# Patient Record
Sex: Female | Born: 1985 | Race: Black or African American | Hispanic: No | Marital: Single | Smoking: Former smoker
Health system: Southern US, Community
[De-identification: ages and names within clinical notes are randomized; demographics above are authoritative.]

## PROBLEM LIST (undated history)

## (undated) DIAGNOSIS — G4730 Sleep apnea, unspecified: Secondary | ICD-10-CM

## (undated) DIAGNOSIS — K219 Gastro-esophageal reflux disease without esophagitis: Secondary | ICD-10-CM

## (undated) DIAGNOSIS — E119 Type 2 diabetes mellitus without complications: Secondary | ICD-10-CM

## (undated) DIAGNOSIS — F419 Anxiety disorder, unspecified: Secondary | ICD-10-CM

## (undated) DIAGNOSIS — O24419 Gestational diabetes mellitus in pregnancy, unspecified control: Secondary | ICD-10-CM

## (undated) DIAGNOSIS — E669 Obesity, unspecified: Secondary | ICD-10-CM

## (undated) DIAGNOSIS — IMO0001 Reserved for inherently not codable concepts without codable children: Secondary | ICD-10-CM

## (undated) DIAGNOSIS — F32A Depression, unspecified: Secondary | ICD-10-CM

## (undated) DIAGNOSIS — R030 Elevated blood-pressure reading, without diagnosis of hypertension: Secondary | ICD-10-CM

## (undated) DIAGNOSIS — I10 Essential (primary) hypertension: Secondary | ICD-10-CM

## (undated) DIAGNOSIS — F329 Major depressive disorder, single episode, unspecified: Secondary | ICD-10-CM

## (undated) HISTORY — DX: Sleep apnea, unspecified: G47.30

## (undated) HISTORY — DX: Essential (primary) hypertension: I10

## (undated) HISTORY — DX: Reserved for inherently not codable concepts without codable children: IMO0001

## (undated) HISTORY — DX: Depression, unspecified: F32.A

## (undated) HISTORY — DX: Elevated blood-pressure reading, without diagnosis of hypertension: R03.0

## (undated) HISTORY — PX: OTHER SURGICAL HISTORY: SHX169

## (undated) HISTORY — DX: Major depressive disorder, single episode, unspecified: F32.9

## (undated) HISTORY — DX: Gastro-esophageal reflux disease without esophagitis: K21.9

## (undated) HISTORY — DX: Anxiety disorder, unspecified: F41.9

---

## 2004-04-19 ENCOUNTER — Emergency Department: Payer: Self-pay | Admitting: Emergency Medicine

## 2004-05-08 ENCOUNTER — Emergency Department: Payer: Self-pay | Admitting: Emergency Medicine

## 2004-07-20 ENCOUNTER — Emergency Department: Payer: Self-pay | Admitting: Emergency Medicine

## 2005-01-16 ENCOUNTER — Other Ambulatory Visit: Payer: Self-pay

## 2005-01-16 ENCOUNTER — Emergency Department: Payer: Self-pay | Admitting: Emergency Medicine

## 2005-04-20 ENCOUNTER — Other Ambulatory Visit: Admission: RE | Admit: 2005-04-20 | Discharge: 2005-04-20 | Payer: Self-pay | Admitting: Obstetrics and Gynecology

## 2005-05-23 HISTORY — PX: OTHER SURGICAL HISTORY: SHX169

## 2005-12-14 ENCOUNTER — Emergency Department: Payer: Self-pay | Admitting: Emergency Medicine

## 2006-08-14 ENCOUNTER — Other Ambulatory Visit: Payer: Self-pay

## 2006-08-14 ENCOUNTER — Emergency Department: Payer: Self-pay | Admitting: Unknown Physician Specialty

## 2008-03-26 ENCOUNTER — Emergency Department: Payer: Self-pay | Admitting: Emergency Medicine

## 2008-05-23 DIAGNOSIS — I509 Heart failure, unspecified: Secondary | ICD-10-CM

## 2008-05-23 HISTORY — DX: Heart failure, unspecified: I50.9

## 2008-06-14 ENCOUNTER — Emergency Department: Payer: Self-pay | Admitting: Emergency Medicine

## 2008-06-16 ENCOUNTER — Inpatient Hospital Stay (HOSPITAL_COMMUNITY): Admission: AD | Admit: 2008-06-16 | Discharge: 2008-06-16 | Payer: Self-pay | Admitting: Obstetrics and Gynecology

## 2008-07-22 DIAGNOSIS — G43909 Migraine, unspecified, not intractable, without status migrainosus: Secondary | ICD-10-CM | POA: Insufficient documentation

## 2008-10-09 ENCOUNTER — Inpatient Hospital Stay (HOSPITAL_COMMUNITY): Admission: AD | Admit: 2008-10-09 | Discharge: 2008-10-10 | Payer: Self-pay | Admitting: Obstetrics and Gynecology

## 2008-10-14 ENCOUNTER — Inpatient Hospital Stay (HOSPITAL_COMMUNITY): Admission: AD | Admit: 2008-10-14 | Discharge: 2008-10-15 | Payer: Self-pay | Admitting: Obstetrics and Gynecology

## 2008-10-15 ENCOUNTER — Encounter: Payer: Self-pay | Admitting: Obstetrics and Gynecology

## 2008-10-26 ENCOUNTER — Ambulatory Visit: Payer: Self-pay | Admitting: Cardiovascular Disease

## 2008-10-29 ENCOUNTER — Inpatient Hospital Stay (HOSPITAL_COMMUNITY): Admission: AD | Admit: 2008-10-29 | Discharge: 2008-11-09 | Payer: Self-pay | Admitting: Obstetrics and Gynecology

## 2008-10-30 ENCOUNTER — Encounter (HOSPITAL_COMMUNITY): Payer: Self-pay | Admitting: Obstetrics and Gynecology

## 2008-10-31 ENCOUNTER — Encounter (HOSPITAL_COMMUNITY): Payer: Self-pay | Admitting: Obstetrics and Gynecology

## 2008-11-03 ENCOUNTER — Other Ambulatory Visit (HOSPITAL_COMMUNITY): Payer: Self-pay | Admitting: Obstetrics and Gynecology

## 2008-11-04 ENCOUNTER — Encounter (INDEPENDENT_AMBULATORY_CARE_PROVIDER_SITE_OTHER): Payer: Self-pay | Admitting: Obstetrics and Gynecology

## 2008-11-04 ENCOUNTER — Encounter: Payer: Self-pay | Admitting: Obstetrics and Gynecology

## 2008-11-10 ENCOUNTER — Encounter: Admission: RE | Admit: 2008-11-10 | Discharge: 2008-12-09 | Payer: Self-pay | Admitting: Obstetrics and Gynecology

## 2008-12-10 ENCOUNTER — Encounter: Admission: RE | Admit: 2008-12-10 | Discharge: 2008-12-11 | Payer: Self-pay | Admitting: Obstetrics and Gynecology

## 2008-12-22 ENCOUNTER — Ambulatory Visit: Payer: Self-pay | Admitting: Family Medicine

## 2009-02-12 ENCOUNTER — Encounter: Payer: Self-pay | Admitting: Family Medicine

## 2009-02-20 ENCOUNTER — Encounter: Payer: Self-pay | Admitting: Family Medicine

## 2009-03-23 ENCOUNTER — Encounter: Payer: Self-pay | Admitting: Family Medicine

## 2009-09-22 DIAGNOSIS — N912 Amenorrhea, unspecified: Secondary | ICD-10-CM | POA: Insufficient documentation

## 2010-01-21 ENCOUNTER — Ambulatory Visit: Payer: Self-pay | Admitting: Family Medicine

## 2010-06-14 ENCOUNTER — Encounter: Payer: Self-pay | Admitting: Obstetrics and Gynecology

## 2010-08-30 LAB — COMPREHENSIVE METABOLIC PANEL
ALT: 23 U/L (ref 0–35)
ALT: 13 U/L (ref 0–35)
ALT: 14 U/L (ref 0–35)
ALT: 14 U/L (ref 0–35)
ALT: 26 U/L (ref 0–35)
AST: 36 U/L (ref 0–37)
AST: 45 U/L — ABNORMAL HIGH (ref 0–37)
AST: 19 U/L (ref 0–37)
AST: 20 U/L (ref 0–37)
AST: 30 U/L (ref 0–37)
AST: 41 U/L — ABNORMAL HIGH (ref 0–37)
AST: 43 U/L — ABNORMAL HIGH (ref 0–37)
Albumin: 2 g/dL — ABNORMAL LOW (ref 3.5–5.2)
Albumin: 2.2 g/dL — ABNORMAL LOW (ref 3.5–5.2)
Albumin: 2.2 g/dL — ABNORMAL LOW (ref 3.5–5.2)
Albumin: 2.4 g/dL — ABNORMAL LOW (ref 3.5–5.2)
Albumin: 2.3 g/dL — ABNORMAL LOW (ref 3.5–5.2)
Albumin: 2.4 g/dL — ABNORMAL LOW (ref 3.5–5.2)
Albumin: 2.4 g/dL — ABNORMAL LOW (ref 3.5–5.2)
Albumin: 2.4 g/dL — ABNORMAL LOW (ref 3.5–5.2)
Albumin: 2.6 g/dL — ABNORMAL LOW (ref 3.5–5.2)
Alkaline Phosphatase: 67 U/L (ref 39–117)
Alkaline Phosphatase: 83 U/L (ref 39–117)
Alkaline Phosphatase: 86 U/L (ref 39–117)
Alkaline Phosphatase: 94 U/L (ref 39–117)
Alkaline Phosphatase: 95 U/L (ref 39–117)
BUN: 11 mg/dL (ref 6–23)
BUN: 16 mg/dL (ref 6–23)
BUN: 5 mg/dL — ABNORMAL LOW (ref 6–23)
BUN: 5 mg/dL — ABNORMAL LOW (ref 6–23)
BUN: 8 mg/dL (ref 6–23)
BUN: 9 mg/dL (ref 6–23)
CO2: 24 mEq/L (ref 19–32)
CO2: 23 mEq/L (ref 19–32)
CO2: 24 mEq/L (ref 19–32)
CO2: 25 mEq/L (ref 19–32)
CO2: 25 mEq/L (ref 19–32)
Calcium: 8.3 mg/dL — ABNORMAL LOW (ref 8.4–10.5)
Calcium: 8.7 mg/dL (ref 8.4–10.5)
Calcium: 9 mg/dL (ref 8.4–10.5)
Chloride: 102 mEq/L (ref 96–112)
Chloride: 104 mEq/L (ref 96–112)
Chloride: 105 mEq/L (ref 96–112)
Chloride: 106 mEq/L (ref 96–112)
Chloride: 107 mEq/L (ref 96–112)
Chloride: 108 mEq/L (ref 96–112)
Creatinine, Ser: 0.84 mg/dL (ref 0.4–1.2)
Creatinine, Ser: 0.92 mg/dL (ref 0.4–1.2)
Creatinine, Ser: 0.66 mg/dL (ref 0.4–1.2)
Creatinine, Ser: 0.72 mg/dL (ref 0.4–1.2)
Creatinine, Ser: 0.75 mg/dL (ref 0.4–1.2)
Creatinine, Ser: 0.77 mg/dL (ref 0.4–1.2)
Creatinine, Ser: 0.81 mg/dL (ref 0.4–1.2)
GFR calc Af Amer: >60 mL/min (ref >60)
GFR calc Af Amer: >60 mL/min (ref >60)
GFR calc Af Amer: >60 mL/min (ref >60)
GFR calc Af Amer: >60 mL/min (ref >60)
GFR calc Af Amer: >60 mL/min (ref >60)
GFR calc Af Amer: >60 mL/min (ref >60)
GFR calc Af Amer: >60 mL/min (ref >60)
GFR calc non Af Amer: >60 mL/min (ref >60)
GFR calc non Af Amer: >60 mL/min (ref >60)
GFR calc non Af Amer: >60 mL/min (ref >60)
GFR calc non Af Amer: >60 mL/min (ref >60)
GFR calc non Af Amer: >60 mL/min (ref >60)
Glucose, Bld: 80 mg/dL (ref 70–99)
Glucose, Bld: 119 mg/dL — ABNORMAL HIGH (ref 70–99)
Glucose, Bld: 154 mg/dL — ABNORMAL HIGH (ref 70–99)
Glucose, Bld: 80 mg/dL (ref 70–99)
Potassium: 4.3 mEq/L (ref 3.5–5.1)
Potassium: 4.9 mEq/L (ref 3.5–5.1)
Potassium: 5.1 mEq/L (ref 3.5–5.1)
Potassium: 3.8 mEq/L (ref 3.5–5.1)
Potassium: 4.2 mEq/L (ref 3.5–5.1)
Potassium: 4.2 mEq/L (ref 3.5–5.1)
Potassium: 4.2 mEq/L (ref 3.5–5.1)
Sodium: 137 mEq/L (ref 135–145)
Sodium: 132 mEq/L — ABNORMAL LOW (ref 135–145)
Sodium: 135 mEq/L (ref 135–145)
Sodium: 140 mEq/L (ref 135–145)
Total Bilirubin: 0.1 mg/dL — ABNORMAL LOW (ref 0.3–1.2)
Total Bilirubin: 0.5 mg/dL (ref 0.3–1.2)
Total Bilirubin: 0.2 mg/dL — ABNORMAL LOW (ref 0.3–1.2)
Total Bilirubin: 0.3 mg/dL (ref 0.3–1.2)
Total Bilirubin: 0.4 mg/dL (ref 0.3–1.2)
Total Bilirubin: 0.5 mg/dL (ref 0.3–1.2)
Total Protein: 5 g/dL — ABNORMAL LOW (ref 6.0–8.3)
Total Protein: 5.5 g/dL — ABNORMAL LOW (ref 6.0–8.3)
Total Protein: 5.1 g/dL — ABNORMAL LOW (ref 6.0–8.3)
Total Protein: 5.1 g/dL — ABNORMAL LOW (ref 6.0–8.3)
Total Protein: 6 g/dL (ref 6.0–8.3)

## 2010-08-30 LAB — PROTEIN, URINE, 24 HOUR
Collection Interval-UPROT: 24 hours
Protein, 24H Urine: 1710 mg/d — ABNORMAL HIGH (ref 50–100)
Urine Total Volume-UPROT: 1900 mL

## 2010-08-30 LAB — CBC
HCT: 28.6 % — ABNORMAL LOW (ref 36.0–46.0)
HCT: 29 % — ABNORMAL LOW (ref 36.0–46.0)
HCT: 33.8 % — ABNORMAL LOW (ref 36.0–46.0)
HCT: 28.4 % — ABNORMAL LOW (ref 36.0–46.0)
HCT: 29.7 % — ABNORMAL LOW (ref 36.0–46.0)
HCT: 29.9 % — ABNORMAL LOW (ref 36.0–46.0)
HCT: 31.4 % — ABNORMAL LOW (ref 36.0–46.0)
Hemoglobin: 10 g/dL — ABNORMAL LOW (ref 12.0–15.0)
Hemoglobin: 10.6 g/dL — ABNORMAL LOW (ref 12.0–15.0)
Hemoglobin: 11.1 g/dL — ABNORMAL LOW (ref 12.0–15.0)
Hemoglobin: 11.9 g/dL — ABNORMAL LOW (ref 12.0–15.0)
MCHC: 35.2 g/dL (ref 30.0–36.0)
MCHC: 35.3 g/dL (ref 30.0–36.0)
MCHC: 35.4 g/dL (ref 30.0–36.0)
MCHC: 35.8 g/dL (ref 30.0–36.0)
MCV: 93.1 fL (ref 78.0–100.0)
MCV: 92.2 fL (ref 78.0–100.0)
MCV: 92.3 fL (ref 78.0–100.0)
MCV: 92.4 fL (ref 78.0–100.0)
MCV: 93.1 fL (ref 78.0–100.0)
MCV: 93.3 fL (ref 78.0–100.0)
MCV: 93.8 fL (ref 78.0–100.0)
Platelets: 140 10*3/uL — ABNORMAL LOW (ref 150–400)
Platelets: 143 10*3/uL — ABNORMAL LOW (ref 150–400)
Platelets: 168 10*3/uL (ref 150–400)
Platelets: 174 10*3/uL (ref 150–400)
Platelets: 170 10*3/uL (ref 150–400)
Platelets: 175 10*3/uL (ref 150–400)
Platelets: 178 10*3/uL (ref 150–400)
Platelets: 182 10*3/uL (ref 150–400)
Platelets: 193 10*3/uL (ref 150–400)
Platelets: 193 10*3/uL (ref 150–400)
RBC: 3.03 MIL/uL — ABNORMAL LOW (ref 3.87–5.11)
RBC: 3.61 MIL/uL — ABNORMAL LOW (ref 3.87–5.11)
RBC: 3.05 MIL/uL — ABNORMAL LOW (ref 3.87–5.11)
RBC: 3.1 MIL/uL — ABNORMAL LOW (ref 3.87–5.11)
RBC: 3.28 MIL/uL — ABNORMAL LOW (ref 3.87–5.11)
RBC: 3.4 MIL/uL — ABNORMAL LOW (ref 3.87–5.11)
RDW: 15.4 % (ref 11.5–15.5)
RDW: 15.5 % (ref 11.5–15.5)
RDW: 14.9 % (ref 11.5–15.5)
RDW: 15.1 % (ref 11.5–15.5)
RDW: 15.3 % (ref 11.5–15.5)
RDW: 15.4 % (ref 11.5–15.5)
RDW: 15.6 % — ABNORMAL HIGH (ref 11.5–15.5)
WBC: 10.8 10*3/uL — ABNORMAL HIGH (ref 4.0–10.5)
WBC: 13.4 10*3/uL — ABNORMAL HIGH (ref 4.0–10.5)
WBC: 9.1 10*3/uL (ref 4.0–10.5)
WBC: 9.8 10*3/uL (ref 4.0–10.5)
WBC: 11.4 10*3/uL — ABNORMAL HIGH (ref 4.0–10.5)
WBC: 12.9 10*3/uL — ABNORMAL HIGH (ref 4.0–10.5)
WBC: 8.2 10*3/uL (ref 4.0–10.5)
WBC: 8.5 10*3/uL (ref 4.0–10.5)

## 2010-08-30 LAB — URINALYSIS, MICROSCOPIC ONLY
Nitrite: NEGATIVE
Protein, ur: 100 mg/dL — AB
Urobilinogen, UA: 0.2 mg/dL (ref 0.0–1.0)

## 2010-08-30 LAB — URINE MICROSCOPIC-ADD ON

## 2010-08-30 LAB — CREATININE CLEARANCE, URINE, 24 HOUR
Creatinine, Urine: 105 mg/dL
Creatinine: 0.66 mg/dL (ref 0.40–1.20)
Urine Total Volume-CRCL: 1900 mL

## 2010-08-30 LAB — GLUCOSE, CAPILLARY
Glucose-Capillary: 91 mg/dL (ref 70–99)
Glucose-Capillary: 102 mg/dL — ABNORMAL HIGH (ref 70–99)
Glucose-Capillary: 85 mg/dL (ref 70–99)
Glucose-Capillary: 87 mg/dL (ref 70–99)
Glucose-Capillary: 87 mg/dL (ref 70–99)
Glucose-Capillary: 97 mg/dL (ref 70–99)

## 2010-08-30 LAB — STREP B DNA PROBE: Strep Group B Ag: POSITIVE

## 2010-08-30 LAB — LACTATE DEHYDROGENASE
LDH: 336 U/L — ABNORMAL HIGH (ref 94–250)
LDH: 546 U/L — ABNORMAL HIGH (ref 94–250)
LDH: 258 U/L — ABNORMAL HIGH (ref 94–250)
LDH: 262 U/L — ABNORMAL HIGH (ref 94–250)
LDH: 267 U/L — ABNORMAL HIGH (ref 94–250)

## 2010-08-30 LAB — BASIC METABOLIC PANEL
BUN: 15 mg/dL (ref 6–23)
GFR calc Af Amer: >60 mL/min (ref >60)
GFR calc non Af Amer: >60 mL/min (ref >60)
Potassium: 4.6 mEq/L (ref 3.5–5.1)

## 2010-08-30 LAB — URINALYSIS, ROUTINE W REFLEX MICROSCOPIC
Glucose, UA: NEGATIVE mg/dL
Protein, ur: >300 mg/dL — AB
pH: 6.5 (ref 5.0–8.0)

## 2010-08-30 LAB — GLUCOSE, RANDOM: Glucose, Bld: 87 mg/dL (ref 70–99)

## 2010-08-30 LAB — CARDIAC PANEL(CRET KIN+CKTOT+MB+TROPI)
Relative Index: 1.7 (ref 0.0–2.5)
Troponin I: 0.01 ng/mL (ref 0.00–0.06)

## 2010-08-30 LAB — URIC ACID
Uric Acid, Serum: 6.7 mg/dL (ref 2.4–7.0)
Uric Acid, Serum: 7.8 mg/dL — ABNORMAL HIGH (ref 2.4–7.0)
Uric Acid, Serum: 8 mg/dL — ABNORMAL HIGH (ref 2.4–7.0)
Uric Acid, Serum: 5 mg/dL (ref 2.4–7.0)
Uric Acid, Serum: 5.9 mg/dL (ref 2.4–7.0)
Uric Acid, Serum: 6.3 mg/dL (ref 2.4–7.0)

## 2010-08-31 LAB — COMPREHENSIVE METABOLIC PANEL
ALT: 14 U/L (ref 0–35)
AST: 13 U/L (ref 0–37)
AST: 18 U/L (ref 0–37)
AST: 19 U/L (ref 0–37)
Albumin: 2.7 g/dL — ABNORMAL LOW (ref 3.5–5.2)
Albumin: 2.8 g/dL — ABNORMAL LOW (ref 3.5–5.2)
Albumin: 2.8 g/dL — ABNORMAL LOW (ref 3.5–5.2)
Alkaline Phosphatase: 78 U/L (ref 39–117)
BUN: 3 mg/dL — ABNORMAL LOW (ref 6–23)
BUN: 4 mg/dL — ABNORMAL LOW (ref 6–23)
BUN: 5 mg/dL — ABNORMAL LOW (ref 6–23)
CO2: 23 mEq/L (ref 19–32)
CO2: 27 mEq/L (ref 19–32)
Calcium: 9.4 mg/dL (ref 8.4–10.5)
Calcium: 9.5 mg/dL (ref 8.4–10.5)
Chloride: 105 mEq/L (ref 96–112)
Chloride: 105 mEq/L (ref 96–112)
Chloride: 107 mEq/L (ref 96–112)
Creatinine, Ser: 0.51 mg/dL (ref 0.4–1.2)
Creatinine, Ser: 0.54 mg/dL (ref 0.4–1.2)
Creatinine, Ser: 0.6 mg/dL (ref 0.4–1.2)
Creatinine, Ser: 0.66 mg/dL (ref 0.4–1.2)
GFR calc Af Amer: >60 mL/min (ref >60)
GFR calc Af Amer: >60 mL/min (ref >60)
GFR calc Af Amer: >60 mL/min (ref >60)
GFR calc non Af Amer: >60 mL/min (ref >60)
GFR calc non Af Amer: >60 mL/min (ref >60)
Glucose, Bld: 94 mg/dL (ref 70–99)
Potassium: 4.6 mEq/L (ref 3.5–5.1)
Sodium: 136 mEq/L (ref 135–145)
Total Bilirubin: 0.2 mg/dL — ABNORMAL LOW (ref 0.3–1.2)
Total Bilirubin: 0.2 mg/dL — ABNORMAL LOW (ref 0.3–1.2)
Total Protein: 6.6 g/dL (ref 6.0–8.3)
Total Protein: 6.6 g/dL (ref 6.0–8.3)

## 2010-08-31 LAB — CBC
HCT: 31.6 % — ABNORMAL LOW (ref 36.0–46.0)
HCT: 32.3 % — ABNORMAL LOW (ref 36.0–46.0)
HCT: 33.4 % — ABNORMAL LOW (ref 36.0–46.0)
Hemoglobin: 11.1 g/dL — ABNORMAL LOW (ref 12.0–15.0)
Hemoglobin: 11.4 g/dL — ABNORMAL LOW (ref 12.0–15.0)
MCHC: 35.2 g/dL (ref 30.0–36.0)
MCHC: 35.4 g/dL (ref 30.0–36.0)
MCHC: 35.7 g/dL (ref 30.0–36.0)
MCV: 91 fL (ref 78.0–100.0)
MCV: 91.2 fL (ref 78.0–100.0)
MCV: 91.6 fL (ref 78.0–100.0)
MCV: 91.8 fL (ref 78.0–100.0)
Platelets: 254 10*3/uL (ref 150–400)
Platelets: 262 10*3/uL (ref 150–400)
Platelets: 286 10*3/uL (ref 150–400)
RBC: 3.46 MIL/uL — ABNORMAL LOW (ref 3.87–5.11)
RBC: 3.52 MIL/uL — ABNORMAL LOW (ref 3.87–5.11)
RDW: 15.4 % (ref 11.5–15.5)
WBC: 8.2 10*3/uL (ref 4.0–10.5)
WBC: 8.3 10*3/uL (ref 4.0–10.5)
WBC: 9.1 10*3/uL (ref 4.0–10.5)

## 2010-08-31 LAB — CREATININE CLEARANCE, URINE, 24 HOUR
Creatinine, 24H Ur: 2048 mg/d — ABNORMAL HIGH (ref 700–1800)
Creatinine: 0.66 mg/dL (ref 0.40–1.20)
Urine Total Volume-CRCL: 1825 mL

## 2010-08-31 LAB — PROTEIN, URINE, 24 HOUR
Collection Interval-UPROT: 24 hours
Collection Interval-UPROT: 24 hours
Protein, Urine: 6 mg/dL
Protein, Urine: 6 mg/dL
Urine Total Volume-UPROT: 1825 mL

## 2010-08-31 LAB — GLUCOSE, CAPILLARY
Glucose-Capillary: 105 mg/dL — ABNORMAL HIGH (ref 70–99)
Glucose-Capillary: 86 mg/dL (ref 70–99)

## 2010-08-31 LAB — LACTATE DEHYDROGENASE: LDH: 194 U/L (ref 94–250)

## 2010-08-31 LAB — URIC ACID
Uric Acid, Serum: 3.6 mg/dL (ref 2.4–7.0)
Uric Acid, Serum: 3.7 mg/dL (ref 2.4–7.0)

## 2010-09-06 LAB — URINALYSIS, ROUTINE W REFLEX MICROSCOPIC
Glucose, UA: NEGATIVE mg/dL
Hgb urine dipstick: NEGATIVE
Specific Gravity, Urine: 1.02 (ref 1.005–1.030)
pH: 6.5 (ref 5.0–8.0)

## 2010-09-27 ENCOUNTER — Emergency Department: Payer: Self-pay | Admitting: Internal Medicine

## 2010-10-05 NOTE — Discharge Summary (Signed)
[name], [name]              ACCOUNT NO.:  [id number]   MEDICAL RECORD NO.:  [id number]          PATIENT TYPE:  INP   LOCATION:  9319                          [facility]ILITY:  [facility]   PHYSICIAN:  Zelphia Cairo, MD    DATE OF BIRTH:  1986-01-25   DATE OF ADMISSION:  10/29/2008  DATE OF DISCHARGE:  11/09/2008                               DISCHARGE SUMMARY   ADMITTING DIAGNOSES:  1. Intrauterine pregnancy at 69 weeks' estimated gestational age.  2. Shortness of breath.  3. Elevation in blood pressure.  4. Gestational diabetes.   DISCHARGE DIAGNOSES:  1. Status post low transverse cesarean section secondary to severe      preeclampsia and unfavorable cervix.  2. Viable female infant.   PROCEDURE:  Primary low transverse cesarean section.   REASON FOR ADMISSION:  Please see written H and P.   HOSPITAL COURSE:  The patient is a 25 year old, gravida 1, para 0, who  was admitted with a diagnosis of hypertension and shortness of breath.  Subsequent during her hospitalization, the patient did develop pulmonary  edema.  She was followed closely by Maternal Fetal Medicine and  Cardiology.  The patient did have significant proteinuria on her 24-hour  urine.  Blood pressures continued to worsen despite being on bedrest and  antihypertensive medication.  Liver function tests were also noted to be  elevated.  The patient was evaluated by Perinatology, and decision was  made to proceed with an induction of labor.  The patient was given  Cervidil; however, on the following morning the patient continued to  complain of being short of breath.  Cervix was reexamined and found to  be long, closed, presentation the fetus to be extremely high in the  pelvis.  Given the patient's status of morbid obesity and difficulty  following the baby on the monitor with some intervals of small variable  decelerations, decision was made to proceed with a cesarean delivery.  The patient was then taken to the  operating room where epidural was  dosed to an adequate surgical level.  A low transverse incision was made  with delivery of a viable female infant which was attended by the NICU  team.  The patient tolerated procedure well and taken to the recovery  room in stable condition.  The patient was later then transferred to the  [facility] where she was followed closely over the next several days.  She  began to diurese.  Magnesium sulfate was discontinued, and the patient  was later transferred to the Mother-Baby Unit.  Decision was made to  place the patient on Lovenox due to her morbid obesity and decrease in  ambulation.  She continued to feel better.  She was now without  shortness of breath.  Vital signs were stable with blood pressure 140-  150 over 70s-90s.  Urine output revealed good diuresis.  Baby continued  to do well in the NICU, and on postoperative day 4 the patient was  without complaint.  She denied nausea, vomiting, headache, blurred  vision, or shortness of breath.  Vital signs remained stable.  Blood  pressure  was 139-160 over 81-91.  O2 sats were 96-99 on room air.  Incision was clean, dry, and intact.  Staples were left intact, and the  patient was later discharged home.   CONDITION ON DISCHARGE:  Stable.   DIET:  Regular as tolerated.   ACTIVITY:  No heavy lifting, no driving x2 weeks, no vaginal entry.   FOLLOWUP:  The patient to follow up in the office in 2-3 days for staple  removal.  She is to call for temperature greater than 100 degrees,  persistent nausea, vomiting, heavy vaginal bleeding, and/or redness or  drainage from the incisional site.  The patient was also instructed to  call for shortness of breath, headache, or blurred vision.   DISCHARGE MEDICATIONS:  1. Labetalol 100 mg t.i.d.  2. Lovenox subcu daily.  3. Prenatal vitamins 1 p.o. daily.      Julio Sicks, N.P.      Zelphia Cairo, MD  Electronically Signed    CC/MEDQ  D:  11/20/2008  T:   11/20/2008  Job:  [id number]

## 2010-10-05 NOTE — Op Note (Signed)
NAMESTEVIE, [name]              ACCOUNT NO.:  [id number]   MEDICAL RECORD NO.:  [id number]         PATIENT TYPE:  WINP   LOCATION:                                [facility]   PHYSICIAN:  Michelle L. Grewal, M.D.DATE OF BIRTH:  [birth year]   DATE OF PROCEDURE:  11/05/2008  DATE OF DISCHARGE:                               OPERATIVE REPORT   PREOPERATIVE DIAGNOSES:  Intrauterine pregnancy at 28 weeks, severe  preeclampsia, pulmonary edema, failed induction, and gestational  diabetes.   POSTOPERATIVE DIAGNOSES:  Intrauterine pregnancy at 28 weeks, severe  preeclampsia, pulmonary edema, failed induction, gestational diabetes,  and morbid obesity.   PROCEDURE:  Primary low transverse cesarean section.   SURGEON:  Michelle L. Vincente Poli, MD   ASSISTANT:  Juluis Mire, MD   ANESTHESIA:  Epidural.   ESTIMATED BLOOD LOSS:  500 mL.   DRAINS:  Foley.   PATHOLOGY:  Placenta.   DESCRIPTION OF PROCEDURE:  This patient is a 25 year old gravida 1, para  0, who had been admitted on October 29, 2008, with a diagnosis of  hypertension and shortness of breath.  Subsequently, during her  hospitalization, she was noted to have developed pulmonary edema.  She  was followed by Maternal Fetal Medicine.  She was noted to have  significant proteinuria on 24-hour urine yesterday.  Her blood pressure  was worsened despite being on antihypertensive and be on bedrest, and  her liver function tests were starting to become elevated.  It was  recommended by Perinatology to start an induction.  The patient was  given Cervidil last night.  I came in and examined her at 7:30 this  morning when I assumed call.  She was very short of breath when she was  lying back flat on the bed.  Her cervix was long and closed and  extremely high.  There was no presenting part in the pelvis.  Exam of  the fetal heart rate tracing revealed that is flat for 28 weeks fetus,  but it was very difficult because of her morbid  obesity to keep the baby  on the monitor and there were some periods of small variable  decelerations because she has not changed her cervix and because of  inability to monitor the fetus continuously.  I felt that she needed to  be delivered via C-section.  She was counseled on the risk associated  procedure.  She agreed to proceed.  We took her to operating room #1,  Dr. Arelia Sneddon, was my assistant.  Her epidural was dosed.  She was prepped  and draped.  A Foley catheter was inserted, had been inserted on labor  and delivery.  Her pannus was pushed up because of her morbid obesity.  A low transverse incision was made under her pannus, carried down to the  fascia.  Fascia was cut in the midline and extended laterally.  The  rectus muscles were separated in the midline and the peritoneum was  entered bluntly.  The bladder blade was inserted and low uterine segment  was identified, the bladder flap was created sharply and then digitally.  The bladder blade was then readjusted.  A low transverse incision was  made in the uterus.  The baby was in cephalic presentation, was  delivered quite easily with a female infant, and then the NICU was  present at the time of delivering.  The baby was taken to the NICU  because of extreme prematurity.  The cord pH was obtained and cord blood  was obtained.  The placenta was manually removed, noted to be normal for  her preterm placenta and sent to Pathology.  The uterus was  exteriorized.  Antibiotics and Pitocin had been given.  The uterus was  closed in 2 layers using 0 chromic in a running locked stitch.  Uterus  was returned to the abdomen.  Irrigation was performed.  The peritoneum  and rectus muscles were reapproximated using 0 Vicryl.  The fascia was  closed using 0 Vicryl in running stitch x2 starting each corner meeting  in midline.  After irrigation of subcutaneous layer, the skin was closed  with staples.  All sponge, lap, and instruments counts  were correct x2.  The patient went to recovery room in stable condition.      Michelle L. Vincente Poli, M.D.  Electronically Signed     MLG/MEDQ  D:  11/05/2008  T:  11/05/2008  Job:  [id number]

## 2010-10-05 NOTE — Consult Note (Signed)
NAMEMarland Kitchen  JEZELLE, GULLICK NO.:  [id number]   MEDICAL RECORD NO.:  [id number]         PATIENT TYPE:  WINP   LOCATION:                                [facility]   PHYSICIAN:  Verne Carrow, MDDATE OF BIRTH:  May 26, 1985   DATE OF CONSULTATION:  10/31/2008  DATE OF DISCHARGE:                                 CONSULTATION   PRIMARY CARDIOLOGIST:  New, being seen by Verne Carrow, MD   PRIMARY CARE PHYSICIAN:  Unknown at this time.   HISTORY:  Ms. Profit is a 25 year old African American female with no  known history of coronary artery disease, congestive heart failure or  hypertension prior to this pregnancy.  She is 25 weeks' gestational.  She has noticed increased swelling, headaches, blurred vision during  this pregnancy with elevated blood pressure at 24 weeks with positive  trace protein.  On Oct 14, 2008, she was seen in followup, had a 7-pound  weight gain in 1 week with worsening edema and headaches, blood pressure  150/90, 1+ protein.  She was also diagnosed with gestational diabetes at  that time and mild preeclampsia was noted breech pregnancy.  She states  on Monday she was started on labetalol 100 mg p.o. b.i.d., continued to  feel poorly, yesterday significant dyspnea noted with hypoxia.  The  patient was admitted and treated with IV Lasix with some improvement in  symptoms.  Continue to remain hypertensive in sinus tach, tachycardia on  the monitor, labetalol was increased to 200 mg p.o. b.i.d.  A 2-D  echocardiogram was obtained.  Limited study, systolic function was felt  to be normal with an estimated ejection fraction of 55-60%.  Study was  technically insufficient to allow for evaluation of LV diastolic  dysfunction.  No pericardial effusion noted.  Valves were poorly  visualized.  In talking with Ms. Alvira, she reports a 60-pound weight  gain in her 25 weeks' of pregnancy.  She states compliance with the  labetalol.  Denies any  chest heaviness, tightness or pressure, nausea,  vomiting, cough, fever or chills, lightheadedness, or dizziness.  No  recent travel.  In the emergency room, blood pressure was 169/114 with a  heart rate of 129, respirations 20.  She was treated with oxygen, neb  treatments.  Obtained a chest x-ray and lab work.  2-D echocardiogram  results as stated above.   PAST MEDICAL HISTORY:  1. Morbid obesity.  2. Sedentary lifestyle.  3. Gestational hypertension.  4. Gestational diabetes.  5. Migraines.  6. GERD.   SOCIAL HISTORY:  She lives in [place], [place].  She lives  with her boyfriend who is the father of her child.  She works in  Programmer, applications job.  She denies any tobacco, EtOH, or illicit  substance use.  No diet restrictions.  No exercise.   FAMILY HISTORY:  Mother is alive and well and present in the room.  She  does have hypertension and had preeclampsia during her pregnancies.  Father is alive and well with no known coronary artery disease, history  of heart failure, hypertension.  She  does have some history of  hypertension and heart disease in grandparents.   REVIEW OF SYSTEMS:  As per history of present illness, otherwise  negative.   ALLERGIES:  PENICILLIN which cause hives.   MEDICATIONS:  1. Multivitamin.  2. Aspirin 81 mg.  3. Labetalol, currently on 200 mg p.o. b.i.d. here.  4. Pepcid.  5. Lasix 20 mg IV.  She has had 4 doses.   DIAGNOSTICS:  Chest x-ray showed no acute findings.  CT angiogram,  negative for pulmonary emboli in the central area, unable to exclude  emboli, mid peripheral pulmonary arterial trees, no effusions.  Lab work  showed an AST of 19, ALT 13.  H and H 10.8, 30.2, WBCs 8.2, platelets  182,000.  Sodium 138, potassium 4.2, BUN 6, creatinine 0.77, and glucose  92.  Cardiac enzymes negative x1 set.   PHYSICAL EXAMINATION:  VITAL SIGNS:  Blood pressure 163/104, temperature  97.7, heart rate 98-110, respirations 20, and O2  sats 96% on room air.  GENERAL:  She is in no acute distress, very pleasant female, morbidly  obese, resting quietly in the bed.  HEENT:  Unremarkable.  NECK:  Supple without obvious lymphadenopathy, bruits, or JVD difficult  to assess secondary to body habitus.  CARDIOVASCULAR:  S1 and S2.  Distant heart sounds.  LUNGS:  Clear to auscultation, but distant.  ABDOMEN:  Obese, positive bowel sounds, soft, nontender.  EXTREMITIES:  Lower extremities with +3 edema bilaterally.  She has SCD  in place bilaterally.  NEUROLOGIC:  Alert and oriented x3.  Movement of extremities x4.   IMPRESSION:  1. Dyspnea.  2. Hypertension.  3. Diabetes.  4. Suspected sleep apnea/obesity hypoventilation syndrome.  5. Sedentary lifestyle.  6. Breech pregnancy.   A 2-D echocardiogram showing a normal ejection fraction, questionable  whether or not the patient has a component of diastolic dysfunction.  The patient needs aggressive lifestyle modification, decreased  comorbidities, improved blood pressure.  Blood pressure still not  optimally controlled with low-dose labetalol.  The patient is  tachycardic still.  CT excludes central PE.  CT does indicate bilateral  lower lobe infiltrates, questionable etiology.  Would consider having a  pulmonary evaluated if concerning.  From a cardiac perspective, would  not proceed with TEE at this time as the patient has responded to IV  Lasix.  We will increase labetalol to 400 mg p.o. b.i.d.  We will  consider TEE if the patient becomes unstable to check pulmonary  pressures.  Continue Lasix.  Also if needed, can add HCTZ, Norvasc, or  Aldomet for further optimal blood pressure control.  The patient will  definitely need a sleep study would  hold off until delivery.  Reinforce education.  Do not recommend further  pregnancies without improved health status.  Consider Pulmonary  consultation if CT worrisome.  Dr. Verne Carrow, has been into  examine and assess  the patient and agrees with plan of care.      Dorian Pod, ACNP      Verne Carrow, MD  Electronically Signed    MB/MEDQ  D:  10/31/2008  T:  11/01/2008  Job:  [id number]

## 2010-10-05 NOTE — H&P (Signed)
[name], [name] NO.:  [id number]   MEDICAL RECORD NO.:  [id number]          PATIENT TYPE:  OUT   LOCATION:                                [facility]  [facility]   PHYSICIAN:  Duke Salvia. Marcelle Overlie, M.D.DATE OF BIRTH:  08/09/1985   DATE OF ADMISSION:  10/15/2008  DATE OF DISCHARGE:                              HISTORY & PHYSICAL   CHIEF COMPLAINT:  Swelling and headache, pregnancy.   HISTORY OF PRESENT ILLNESS:  A 25 year old, G1, P0 who was admitted to  [facility] Oct 08, 2008 complaining of headache.  BP at that time  162/112 with 1+ protein.  She did receive betamethasone x2 while in  hospital.  Ultrasound at that time showed breech presentation with  normal growth.  A 24-hour urine showed a 110 mg of protein per 24 hours  with normal creatinine clearance, uric acid and PIH labs were normal at  that time.  Followup today, she is up 6-7 pounds in less than 1 week,  complaining of worsening lower extremity edema and blurry vision and  just not feeling well like she is in slow motion.   In the office, BP 146/90, 1+ protein with the significant 2 to 3+  pitting lower extremity edema.  Additionally while an the hospital she  had her 1-hour GTT done that showed a 1-hour the level of 227 consistent  with gestational diabetes.   PAST MEDICAL HISTORY:  Please see her [facility] form for details.  Specifically, her family history is negative for hypertension.  Initial  BP at 14 weeks 100/70.   PHYSICAL EXAMINATION:  VITAL SIGNS:  Temperature 98.2, blood pressure  140/96.  HEENT:  Unremarkable.  NECK:  Supple without masses.  LUNGS:  Clear.  CARDIOVASCULAR:  Regular rate and rhythm without murmurs, rubs or  gallops.  BREASTS:  Not examined.  ABDOMEN:  Patient with 25 cm fundal height.  Fetal heart rate 140.  Cervix not examined.  EXTREMITIES:  Revealed 2 to 3+ pitting lower extremity edema.  Reflexes  were 1 to 2+, no clonus.   IMPRESSION:  1. Patient with  25-week breech pregnancy.  2. Preeclampsia, mild.  3. Gestational diabetes.   PLAN:  We will admit for further observation, MFM consult.      Richard M. Marcelle Overlie, M.D.  Electronically Signed    RMH/MEDQ  D:  10/14/2008  T:  10/14/2008  Job:  [id number]

## 2010-10-05 NOTE — H&P (Signed)
[name], [name]              ACCOUNT NO.:  [id number]   MEDICAL RECORD NO.:  [id number]          PATIENT TYPE:  OBV   LOCATION:  9161                          [facility]   PHYSICIAN:  Duke Salvia. Marcelle Overlie, M.D.DATE OF BIRTH:  [birth year]   DATE OF ADMISSION:  10/08/2008  DATE OF DISCHARGE:  10/07/2008                              HISTORY & PHYSICAL   CHIEF COMPLAINT:  Swelling, headache, blurred vision.   HISTORY OF PRESENT ILLNESS:  A 25 year old G1, P0 at approximately 24  weeks was seen earlier in the office today complaining of lower  extremity edema, persistent headache and blurred vision.  At her visit  last week, she had a BP 138/98 with trace protein.  Today in the office,  BP was 160/110 with 1+ protein and 2+ lower extremity edema noted.   Ultrasound was carried out in the office revealing normal fluid and  normal growth parameters.  She was sent to MAU for serial BP evaluation  and PIH labs.  In MAU, her labs were normal.  She was given Tylenol  without relief of her headache, still continued to complain of blurred  vision and some of her blood pressures were still significantly  elevated, so she was admitted for further evaluation and monitoring.   PAST MEDICAL HISTORY:   ALLERGIES:  PENICILLIN.   OPERATIONS:  None.   FAMILY HISTORY AND SOCIAL HISTORY:  Please see her [employer] form for  details.  Of significance in her history is a history of migraine, but  no family history of hypertension.   PHYSICAL EXAMINATION:  VITAL SIGNS:  Temperature 98.2, blood pressure  160/110.  HEENT:  Unremarkable.  NECK:  Supple without masses.  LUNGS:  Clear.  CARDIOVASCULAR:  Rate and rhythm without murmurs, rubs or gallops.  BREASTS:  Not examined.  PELVIC:  A 25 cm fundal height.  Fetal heart rate 140.  Cervix was  closed.  EXTREMITIES:  Revealed 2+ lower extremity edema.  Reflexes 1-2+ with no  clonus.  Also of note was her weight of 313 pounds.   IMPRESSION:  1.  24-week intrauterine pregnancy.  2. Obesity.  3. Pregnancy-induced hypertension.   PLAN:  We will admit for 24-hour urine, will give betamethasone and  continue to monitor with repeat PIH labs in the a.m.      Richard M. Marcelle Overlie, M.D.  Electronically Signed     RMH/MEDQ  D:  10/08/2008  T:  10/08/2008  Job:  [id number]

## 2011-02-11 ENCOUNTER — Ambulatory Visit: Payer: Self-pay | Admitting: Family Medicine

## 2012-05-11 ENCOUNTER — Ambulatory Visit: Payer: Self-pay | Admitting: Family Medicine

## 2012-05-24 ENCOUNTER — Ambulatory Visit: Payer: Self-pay | Admitting: Family Medicine

## 2012-08-13 DIAGNOSIS — M545 Low back pain: Secondary | ICD-10-CM

## 2012-08-13 DIAGNOSIS — G8929 Other chronic pain: Secondary | ICD-10-CM | POA: Insufficient documentation

## 2012-10-24 DIAGNOSIS — R7989 Other specified abnormal findings of blood chemistry: Secondary | ICD-10-CM | POA: Insufficient documentation

## 2012-12-18 ENCOUNTER — Encounter: Payer: Self-pay | Admitting: Physical Medicine and Rehabilitation

## 2012-12-21 ENCOUNTER — Encounter: Payer: Self-pay | Admitting: Physical Medicine and Rehabilitation

## 2013-07-02 LAB — HEPATIC FUNCTION PANEL
ALT: 18 U/L (ref 7–35)
AST: 22 U/L (ref 13–35)

## 2013-07-02 LAB — CBC AND DIFFERENTIAL
HCT: 38 % (ref 36–46)
Hemoglobin: 13.1 g/dL (ref 12.0–16.0)
Platelets: 274 10*3/uL (ref 150–399)
WBC: 4.7 10^3/mL

## 2013-07-02 LAB — LIPID PANEL
CHOLESTEROL: 153 mg/dL (ref 0–200)
HDL: 57 mg/dL (ref 35–70)
LDL Cholesterol: 88 mg/dL
TRIGLYCERIDES: 38 mg/dL — AB (ref 40–160)

## 2013-07-02 LAB — BASIC METABOLIC PANEL
BUN: 10 mg/dL (ref 4–21)
CREATININE: 0.8 mg/dL (ref 0.5–1.1)
POTASSIUM: 4.7 mmol/L (ref 3.4–5.3)
SODIUM: 140 mmol/L (ref 137–147)

## 2014-06-10 LAB — TSH: TSH: 1.88 u[IU]/mL (ref 0.41–5.90)

## 2014-06-10 LAB — HEMOGLOBIN A1C: Hgb A1c MFr Bld: 5.3 % (ref 4.0–6.0)

## 2014-09-10 LAB — HM PAP SMEAR: HM Pap smear: NORMAL

## 2014-10-21 ENCOUNTER — Other Ambulatory Visit: Payer: Self-pay | Admitting: Family Medicine

## 2014-10-21 DIAGNOSIS — N939 Abnormal uterine and vaginal bleeding, unspecified: Secondary | ICD-10-CM

## 2014-10-23 ENCOUNTER — Ambulatory Visit: Payer: Medicaid Other

## 2014-10-23 ENCOUNTER — Ambulatory Visit
Admission: RE | Admit: 2014-10-23 | Discharge: 2014-10-23 | Disposition: A | Discharge status: Home / Self Care | Payer: Medicaid Other | Source: Ambulatory Visit | Attending: Family Medicine | Admitting: Family Medicine

## 2014-10-23 DIAGNOSIS — Z975 Presence of (intrauterine) contraceptive device: Secondary | ICD-10-CM | POA: Insufficient documentation

## 2014-10-23 DIAGNOSIS — N939 Abnormal uterine and vaginal bleeding, unspecified: Secondary | ICD-10-CM | POA: Diagnosis not present

## 2014-10-24 ENCOUNTER — Other Ambulatory Visit: Payer: Self-pay | Admitting: Family Medicine

## 2014-10-24 ENCOUNTER — Telehealth: Payer: Self-pay

## 2014-10-24 DIAGNOSIS — N939 Abnormal uterine and vaginal bleeding, unspecified: Secondary | ICD-10-CM

## 2014-10-24 NOTE — Telephone Encounter (Signed)
-----   Message from Anola Gurneyobert Chauvin, [place] sent at 10/23/2014  5:22 PM EDT ----- Ultrasound sees IUD is out of place in your uterus.Everything else is normal. Which gyn do you wish us to send your to?

## 2014-10-24 NOTE — Telephone Encounter (Signed)
Patient advised of lab results

## 2014-10-24 NOTE — Telephone Encounter (Signed)
Unable to reach patient at this time, home and cell number are same. Left message to call back. KW

## 2014-10-24 NOTE — Telephone Encounter (Signed)
Patient would like to be referred to [facility] she request to see same female physician that she saw last time or whoever she can get in with sooner. KW

## 2015-02-05 ENCOUNTER — Ambulatory Visit (INDEPENDENT_AMBULATORY_CARE_PROVIDER_SITE_OTHER): Payer: Medicaid Other | Admitting: Physician Assistant

## 2015-02-05 ENCOUNTER — Encounter: Payer: Self-pay | Admitting: Physician Assistant

## 2015-02-05 ENCOUNTER — Other Ambulatory Visit: Payer: Self-pay

## 2015-02-05 VITALS — BP 120/80 | HR 60 | Temp 98.4°F | Resp 16 | Wt 287.2 lb

## 2015-02-05 DIAGNOSIS — B3731 Acute candidiasis of vulva and vagina: Secondary | ICD-10-CM | POA: Insufficient documentation

## 2015-02-05 DIAGNOSIS — K589 Irritable bowel syndrome without diarrhea: Secondary | ICD-10-CM | POA: Insufficient documentation

## 2015-02-05 DIAGNOSIS — R1031 Right lower quadrant pain: Secondary | ICD-10-CM | POA: Insufficient documentation

## 2015-02-05 DIAGNOSIS — M545 Low back pain, unspecified: Secondary | ICD-10-CM | POA: Insufficient documentation

## 2015-02-05 DIAGNOSIS — Z7251 High risk heterosexual behavior: Secondary | ICD-10-CM | POA: Insufficient documentation

## 2015-02-05 DIAGNOSIS — E559 Vitamin D deficiency, unspecified: Secondary | ICD-10-CM | POA: Insufficient documentation

## 2015-02-05 DIAGNOSIS — N390 Urinary tract infection, site not specified: Secondary | ICD-10-CM | POA: Insufficient documentation

## 2015-02-05 DIAGNOSIS — F32A Depression, unspecified: Secondary | ICD-10-CM | POA: Insufficient documentation

## 2015-02-05 DIAGNOSIS — M797 Fibromyalgia: Secondary | ICD-10-CM | POA: Insufficient documentation

## 2015-02-05 DIAGNOSIS — R109 Unspecified abdominal pain: Secondary | ICD-10-CM | POA: Insufficient documentation

## 2015-02-05 DIAGNOSIS — Z6841 Body Mass Index (BMI) 40.0 and over, adult: Secondary | ICD-10-CM | POA: Insufficient documentation

## 2015-02-05 DIAGNOSIS — F329 Major depressive disorder, single episode, unspecified: Secondary | ICD-10-CM

## 2015-02-05 DIAGNOSIS — K219 Gastro-esophageal reflux disease without esophagitis: Secondary | ICD-10-CM

## 2015-02-05 DIAGNOSIS — F419 Anxiety disorder, unspecified: Secondary | ICD-10-CM | POA: Insufficient documentation

## 2015-02-05 DIAGNOSIS — G4733 Obstructive sleep apnea (adult) (pediatric): Secondary | ICD-10-CM | POA: Insufficient documentation

## 2015-02-05 DIAGNOSIS — B373 Candidiasis of vulva and vagina: Secondary | ICD-10-CM | POA: Insufficient documentation

## 2015-02-05 DIAGNOSIS — R030 Elevated blood-pressure reading, without diagnosis of hypertension: Secondary | ICD-10-CM

## 2015-02-05 DIAGNOSIS — R5383 Other fatigue: Secondary | ICD-10-CM | POA: Insufficient documentation

## 2015-02-05 DIAGNOSIS — R202 Paresthesia of skin: Secondary | ICD-10-CM | POA: Insufficient documentation

## 2015-02-05 DIAGNOSIS — O039 Complete or unspecified spontaneous abortion without complication: Secondary | ICD-10-CM | POA: Insufficient documentation

## 2015-02-05 DIAGNOSIS — L739 Follicular disorder, unspecified: Secondary | ICD-10-CM | POA: Insufficient documentation

## 2015-02-05 DIAGNOSIS — R110 Nausea: Secondary | ICD-10-CM

## 2015-02-05 DIAGNOSIS — N939 Abnormal uterine and vaginal bleeding, unspecified: Secondary | ICD-10-CM | POA: Insufficient documentation

## 2015-02-05 DIAGNOSIS — IMO0001 Reserved for inherently not codable concepts without codable children: Secondary | ICD-10-CM | POA: Insufficient documentation

## 2015-02-05 DIAGNOSIS — Z8632 Personal history of gestational diabetes: Secondary | ICD-10-CM | POA: Insufficient documentation

## 2015-02-05 DIAGNOSIS — R87629 Unspecified abnormal cytological findings in specimens from vagina: Secondary | ICD-10-CM | POA: Insufficient documentation

## 2015-02-05 LAB — POCT URINALYSIS DIPSTICK
Bilirubin, UA: NEGATIVE
Blood, UA: NEGATIVE
CLARITY UA: NEGATIVE
Glucose, UA: NEGATIVE
Ketones, UA: NEGATIVE
LEUKOCYTES UA: NEGATIVE
Nitrite, UA: NEGATIVE
PROTEIN UA: NEGATIVE
SPEC GRAV UA: 1.015
UROBILINOGEN UA: 0.2
pH, UA: 6.5

## 2015-02-05 LAB — POCT URINE PREGNANCY: PREG TEST UR: NEGATIVE

## 2015-02-05 MED ORDER — PANTOPRAZOLE SODIUM 40 MG PO TBEC
40.0000 mg | DELAYED_RELEASE_TABLET | Freq: Every day | ORAL | Status: DC
Start: 2015-02-05 — End: 2016-06-24

## 2015-02-05 NOTE — Patient Instructions (Signed)
Gastroesophageal Reflux Disease, Adult Gastroesophageal reflux disease (GERD) happens when acid from your stomach flows up into the esophagus. When acid comes in contact with the esophagus, the acid causes soreness (inflammation) in the esophagus. Over time, GERD may create small holes (ulcers) in the lining of the esophagus. CAUSES   Increased body weight. This puts pressure on the stomach, making acid rise from the stomach into the esophagus.  Smoking. This increases acid production in the stomach.  Drinking alcohol. This causes decreased pressure in the lower esophageal sphincter (valve or ring of muscle between the esophagus and stomach), allowing acid from the stomach into the esophagus.  Late evening meals and a full stomach. This increases pressure and acid production in the stomach.  A malformed lower esophageal sphincter. Sometimes, no cause is found. SYMPTOMS   Burning pain in the lower part of the mid-chest behind the breastbone and in the mid-stomach area. This may occur twice a week or more often.  Trouble swallowing.  Sore throat.  Dry cough.  Asthma-like symptoms including chest tightness, shortness of breath, or wheezing. DIAGNOSIS  Your caregiver may be able to diagnose GERD based on your symptoms. In some cases, X-rays and other tests may be done to check for complications or to check the condition of your stomach and esophagus. TREATMENT  Your caregiver may recommend over-the-counter or prescription medicines to help decrease acid production. Ask your caregiver before starting or adding any new medicines.  HOME CARE INSTRUCTIONS   Change the factors that you can control. Ask your caregiver for guidance concerning weight loss, quitting smoking, and alcohol consumption.  Avoid foods and drinks that make your symptoms worse, such as:  Caffeine or alcoholic drinks.  Chocolate.  Peppermint or mint flavorings.  Garlic and onions.  Spicy foods.  Citrus fruits,  such as oranges, lemons, or limes.  Tomato-based foods such as sauce, chili, salsa, and pizza.  Fried and fatty foods.  Avoid lying down for the 3 hours prior to your bedtime or prior to taking a nap.  Eat small, frequent meals instead of large meals.  Wear loose-fitting clothing. Do not wear anything tight around your waist that causes pressure on your stomach.  Raise the head of your bed 6 to 8 inches with wood blocks to help you sleep. Extra pillows will not help.  Only take over-the-counter or prescription medicines for pain, discomfort, or fever as directed by your caregiver.  Do not take aspirin, ibuprofen, or other nonsteroidal anti-inflammatory drugs (NSAIDs). SEEK IMMEDIATE MEDICAL CARE IF:   You have pain in your arms, neck, jaw, teeth, or back.  Your pain increases or changes in intensity or duration.  You develop nausea, vomiting, or sweating (diaphoresis).  You develop shortness of breath, or you faint.  Your vomit is green, yellow, black, or looks like coffee grounds or blood.  Your stool is red, bloody, or black. These symptoms could be signs of other problems, such as heart disease, gastric bleeding, or esophageal bleeding. MAKE SURE YOU:   Understand these instructions.  Will watch your condition.  Will get help right away if you are not doing well or get worse. Document Released: 02/16/2005 Document Revised: 08/01/2011 Document Reviewed: 11/26/2010 [facility] Patient Information 2015 [place], [place]. This information is not intended to replace advice given to you by your health care provider. Make sure you discuss any questions you have with your health care provider. Food Choices for Gastroesophageal Reflux Disease When you have gastroesophageal reflux disease (GERD), the foods you  eat and your eating habits are very important. Choosing the right foods can help ease the discomfort of GERD. WHAT GENERAL GUIDELINES DO I NEED TO FOLLOW?  Choose fruits,  vegetables, whole grains, low-fat dairy products, and low-fat meat, fish, and poultry.  Limit fats such as oils, salad dressings, butter, nuts, and avocado.  Keep a food diary to identify foods that cause symptoms.  Avoid foods that cause reflux. These may be different for different people.  Eat frequent small meals instead of three large meals each day.  Eat your meals slowly, in a relaxed setting.  Limit fried foods.  Cook foods using methods other than frying.  Avoid drinking alcohol.  Avoid drinking large amounts of liquids with your meals.  Avoid bending over or lying down until 2-3 hours after eating. WHAT FOODS ARE NOT RECOMMENDED? The following are some foods and drinks that may worsen your symptoms: Vegetables Tomatoes. Tomato juice. Tomato and spaghetti sauce. Chili peppers. Onion and garlic. Horseradish. Fruits Oranges, grapefruit, and lemon (fruit and juice). Meats High-fat meats, fish, and poultry. This includes hot dogs, ribs, ham, sausage, salami, and bacon. Dairy Whole milk and chocolate milk. Sour cream. Cream. Butter. Ice cream. Cream cheese.  Beverages Coffee and tea, with or without caffeine. Carbonated beverages or energy drinks. Condiments Hot sauce. Barbecue sauce.  Sweets/Desserts Chocolate and cocoa. Donuts. Peppermint and spearmint. Fats and Oils High-fat foods, including [place] fries and potato chips. Other Vinegar. Strong spices, such as black pepper, white pepper, red pepper, cayenne, curry powder, cloves, ginger, and chili powder. The items listed above may not be a complete list of foods and beverages to avoid. Contact your dietitian for more information. Document Released: 05/09/2005 Document Revised: 05/14/2013 Document Reviewed: 03/13/2013 [facility] Patient Information 2015 [place], [place]. This information is not intended to replace advice given to you by your health care provider. Make sure you discuss any questions you have with your  health care provider. Pantoprazole tablets What is this medicine? PANTOPRAZOLE (pan TOE pra zole) prevents the production of acid in the stomach. It is used to treat gastroesophageal reflux disease (GERD), inflammation of the esophagus, and Zollinger-Ellison syndrome. This medicine may be used for other purposes; ask your health care provider or pharmacist if you have questions. COMMON BRAND NAME(S): Protonix What should I tell my health care provider before I take this medicine? They need to know if you have any of these conditions: -liver disease -low levels of magnesium in the blood -an unusual or allergic reaction to omeprazole, lansoprazole, pantoprazole, rabeprazole, other medicines, foods, dyes, or preservatives -pregnant or trying to get pregnant -breast-feeding How should I use this medicine? Take this medicine by mouth. Swallow the tablets whole with a drink of water. Follow the directions on the prescription label. Do not crush, break, or chew. Take your medicine at regular intervals. Do not take your medicine more often than directed. Talk to your pediatrician regarding the use of this medicine in children. While this drug may be prescribed for children as young as 5 years for selected conditions, precautions do apply. Overdosage: If you think you have taken too much of this medicine contact a poison control center or emergency room at once. NOTE: This medicine is only for you. Do not share this medicine with others. What if I miss a dose? If you miss a dose, take it as soon as you can. If it is almost time for your next dose, take only that dose. Do not take double or extra doses.  What may interact with this medicine? Do not take this medicine with any of the following medications: -atazanavir -nelfinavir This medicine may also interact with the following medications: -ampicillin -delavirdine -digoxin -diuretics -iron salts -medicines for fungal infections like  ketoconazole, itraconazole and voriconazole -warfarin This list may not describe all possible interactions. Give your health care provider a list of all the medicines, herbs, non-prescription drugs, or dietary supplements you use. Also tell them if you smoke, drink alcohol, or use illegal drugs. Some items may interact with your medicine. What should I watch for while using this medicine? It can take several days before your stomach pain gets better. Check with your doctor or health care professional if your condition does not start to get better, or if it gets worse. You may need blood work done while you are taking this medicine. What side effects may I notice from receiving this medicine? Side effects that you should report to your doctor or health care professional as soon as possible: -allergic reactions like skin rash, itching or hives, swelling of the face, lips, or tongue -bone, muscle or joint pain -breathing problems -chest pain or chest tightness -dark yellow or brown urine -dizziness -fast, irregular heartbeat -feeling faint or lightheaded -fever or sore throat -muscle spasm -palpitations -redness, blistering, peeling or loosening of the skin, including inside the mouth -seizures -tremors -unusual bleeding or bruising -unusually weak or tired -yellowing of the eyes or skin Side effects that usually do not require medical attention (Report these to your doctor or health care professional if they continue or are bothersome.): -constipation -diarrhea -dry mouth -headache -nausea This list may not describe all possible side effects. Call your doctor for medical advice about side effects. You may report side effects to FDA at 1-800-FDA-1088. Where should I keep my medicine? Keep out of the reach of children. Store at room temperature between 15 and 30 degrees C (59 and 86 degrees F). Protect from light and moisture. Throw away any unused medicine after the expiration  date. NOTE: This sheet is a summary. It may not cover all possible information. If you have questions about this medicine, talk to your doctor, pharmacist, or health care provider.  2015, Elsevier/Gold Standard. (2012-03-07 16:40:16)

## 2015-02-05 NOTE — Progress Notes (Signed)
Patient: [name] Female    DOB: 11-Sep-1985   29 y.o.   MRN: [id number] Visit Date: 02/05/2015  Today's Provider: Margaretann Loveless, [name]   Chief Complaint  Patient presents with  . Nausea  . Generalized Body Aches   Subjective:    HPI [name] is a 29 year old who comes today for nausea and body ache. Nausea started a week ago, anything that patient eats feels like is going to come up. Patient also report having body ache for the last three days, no fever, cough, congestion,  or sore throat.  She has not had any vomiting.  Just recently started depo-provera.  Had previously been on Mirena.  No known cause of nausea.  She has not had a menstrual cycle in over 4 years. She states that prior to having the Mirena she had irregular cycles. She also has IBS mixed. She states that this has been stable and unchanged. She denies any melena or hematochezia. She denies any indigestion or heartburn symptoms. However she states that her fianc was rubbing her chest and she did have tenderness in the epigastric and substernal region.    Allergies  Allergen Reactions  . Penicillins Hives   Previous Medications   ALBUTEROL (PROAIR HFA) 108 (90 BASE) MCG/ACT INHALER    Inhale into the lungs.   ESTRADIOL CYPIONATE (DEPO-ESTRADIOL) 5 MG/ML INJECTION    Inject into the muscle every 3 (three) months.    Review of Systems  Constitutional: Positive for chills and fatigue. Negative for fever.  HENT: Negative for ear pain, rhinorrhea, sinus pressure, sneezing and sore throat.   Eyes: Negative.   Respiratory: Negative.  Negative for chest tightness, shortness of breath and wheezing.   Cardiovascular: Negative.  Negative for chest pain and palpitations.  Gastrointestinal: Positive for nausea.  Endocrine: Negative.   Genitourinary: Negative.   Musculoskeletal: Positive for arthralgias (body ache).  Skin: Negative.   Allergic/Immunologic: Negative.   Neurological: Negative.     Hematological: Negative.   Psychiatric/Behavioral: Negative.     Social History  Substance Use Topics  . Smoking status: Light Tobacco Smoker  . Smokeless tobacco: Not on file     Comment: smokes cigars  . Alcohol Use: Yes     Comment: ocassional alcohol use; Mixed drinks once every 2-3 months   Objective:   There were no vitals taken for this visit.  Physical Exam  Constitutional: She appears well-developed and well-nourished. No distress.  Cardiovascular: Normal rate, regular rhythm and normal heart sounds.  Exam reveals no gallop and no friction rub.   No murmur heard. Pulmonary/Chest: Effort normal and breath sounds normal. No respiratory distress. She has no wheezes. She has no rales.  Abdominal: Soft. Bowel sounds are normal. She exhibits no distension and no mass. There is no hepatosplenomegaly. There is tenderness in the right upper quadrant and epigastric area. There is no rigidity, no rebound, no guarding, no CVA tenderness and negative Murphy's sign.  Skin: She is not diaphoretic.  Vitals reviewed.       Assessment & Plan:     1. Nausea UA was negative for UTI. Pregnancy test was negative. Being that the nausea only occurs after eating or when she is lying flat I do question if this is an abnormal presentation of acid reflux. I will treat her with a trial run of protonic's as below and we will reevaluate in 2 weeks. If symptoms persist with treatment we will move  forward with further workup for cause and possible referral to gastroenterology. - POCT Urinalysis Dipstick - POCT urine pregnancy - pantoprazole (PROTONIX) 40 MG tablet; Take 1 tablet (40 mg total) by mouth daily.  Dispense: 30 tablet; Refill: 3  2. Gastroesophageal reflux disease without esophagitis See above medical treatment plan for nausea. - pantoprazole (PROTONIX) 40 MG tablet; Take 1 tablet (40 mg total) by mouth daily.  Dispense: 30 tablet; Refill: 3       Margaretann Loveless, [name]  [facility]  FAMILY PRACTICE [facility]

## 2015-02-12 ENCOUNTER — Ambulatory Visit
Admission: RE | Admit: 2015-02-12 | Discharge: 2015-02-12 | Disposition: A | Discharge status: Home / Self Care | Payer: Medicaid Other | Source: Ambulatory Visit | Attending: Obstetrics & Gynecology | Admitting: Obstetrics & Gynecology

## 2015-02-12 VITALS — BP 133/78 | HR 76 | Temp 98.8°F | Resp 18 | Ht 60.0 in | Wt 280.4 lb

## 2015-02-12 DIAGNOSIS — O1410 Severe pre-eclampsia, unspecified trimester: Secondary | ICD-10-CM | POA: Insufficient documentation

## 2015-02-12 DIAGNOSIS — O1490 Unspecified pre-eclampsia, unspecified trimester: Secondary | ICD-10-CM | POA: Diagnosis not present

## 2015-02-12 DIAGNOSIS — Z8759 Personal history of other complications of pregnancy, childbirth and the puerperium: Secondary | ICD-10-CM | POA: Insufficient documentation

## 2015-02-12 DIAGNOSIS — O1494 Unspecified pre-eclampsia, complicating childbirth: Secondary | ICD-10-CM

## 2015-02-12 DIAGNOSIS — Z3189 Encounter for other procreative management: Secondary | ICD-10-CM | POA: Diagnosis present

## 2015-02-12 NOTE — Progress Notes (Addendum)
Maternal-Fetal Medicine Pre-conception Consultation:  Ms. Colee is a 29 year-old G2 P0111 who is referred by Consuella Lose for preconeption consultation.  Ms. Gulas has a history of preterm delivery in setting of severe preeclampsia.  In 2010, she reports that at 38 weeks' gestation, she was hospitalized with new-onset hypertension and shortness of breath at the [facility] in [place]. Over the course of the next week she developed pulmonary edema with worsening hypertension and elevated liver enzymes.   She was placed on magnesium and an induction of labor began. The following morning she had little progress with her induction and the decision made to proceed with cesarean delivery.  Ms. Broy is unsure of the type of uterine incision made. She reports that her postpartum course was uncomplicated. Her daughter remained in the ICN for 2 months.  She has some developmental delay, allergies and asthma but overall is "doing well".    Ms. Bernick is thinking about getting pregnant in the next year.  She does not know if she was tested for antiphospholipid antibody testing and is not sure if she had a classical uterine incision or not.  PMH:  Denies history of hypertension outside of pregnancy. Mild asthma. No diabetes or thyroid disorders PSH:  D&C for first trimester miscarriage. Cesarean delivery at 28 weeks (see above) PObH: G2 P0111.  First trimester SAB in 2007 which required D&C.  Cesarean delivery at 28 weeks in setting of early-onset servere preelcmapsia. Female fetus 1 pound 15 ounces PGyN; Remote history of abnormal paps. Denies history of STDs Meds: Albuterol MDI All: PCN SH: Smokes cigars. Unemployed. Single but lives with the father-of-her child FH: Denies FH of birth defects, MR or genetic disordes. No FH of DVT or early MI/stroke ROS: No complaints  Exam: Filed Vitals:   02/12/15 1014  BP: 133/78  Pulse: 76  Temp: 98.8 F (37.1 C)  Resp: 18    Medical record review  [facility] 2010): Pt admitted on 10/29/2008 at 27 weeks with hypertension and shortness of breath. Had a normal echocardiogram. Over the course of the next week she developed pulmonary edema and worsening preeclampsia. She received steroids.  Low-transverse uterine incision documented in operative report summary. Labs: -24 hour urine protein 2.0 g -Uric acid 5.1 on admission, increased to 8.0 -Creatinine 0.66 on admission, increased to 0.84 -AST/ALT 20/14 on admission, increased to 45/24 -LDH 267 on admission, increased to 527 -Plt count 193 on admission, decreased to 143  Assessment and Recommendations: Ms Hojnacki is a 29 year-old G2 P0111 with history of early-onset severe preeclampsia resulting in delivery at 28 weeks via low-transverse cesarean delivery at [facility] in 2010 who presents for pre-conception consultation.  She does not report a history of chronic hypertension or diabetes.  We discussed that women with a history of early onset severe preeclampsia are at risk for recurrent early severe preeclampsia.  In addition, it is possible that recurrent preclampsia can present at even earlier gestational ages. We discussed the risk for preterm delivery and risk for severe life-long handicaps in her baby.  -Antiphospholipid antibody testing was sent -If decides to proceed with pregnancy, recommend use of daily baby aspirin in pregnancy starting at 12 weeks to decrease risk of preeclampsia -Return in 2-4 weeks to discuss APS results. -Recommend obtaining baseline LFTs, BMP, urine protein and uric acid so that comparisons can be made in pregnancy  [place], [place] A, MD

## 2015-02-18 LAB — CARDIOLIPIN ANTIBODIES, IGG, IGM, IGA: Anticardiolipin IgA: <9 APL U/mL (ref 0–11)

## 2015-02-18 LAB — BETA-2-GLYCOPROTEIN I ABS, IGG/M/A
Beta-2 Glyco I IgG: <9 GPI IgG units (ref 0–20)
Beta-2-Glycoprotein I IgM: 17 GPI IgM units (ref 0–32)

## 2015-02-18 LAB — LUPUS ANTICOAGULANT PANEL
DRVVT: 43 s (ref 0.0–55.1)
PTT Lupus Anticoagulant: 43.2 s (ref 0.0–50.0)

## 2015-02-20 ENCOUNTER — Ambulatory Visit: Payer: Medicaid Other | Admitting: Physician Assistant

## 2015-03-12 ENCOUNTER — Ambulatory Visit
Admission: RE | Admit: 2015-03-12 | Discharge: 2015-03-12 | Disposition: A | Discharge status: Home / Self Care | Payer: Medicaid Other | Source: Ambulatory Visit | Attending: Maternal & Fetal Medicine | Admitting: Maternal & Fetal Medicine

## 2015-03-12 VITALS — BP 105/48 | HR 86 | Temp 98.4°F | Resp 18 | Ht 61.2 in | Wt 282.4 lb

## 2015-03-12 DIAGNOSIS — IMO0002 Reserved for concepts with insufficient information to code with codable children: Secondary | ICD-10-CM

## 2015-03-12 NOTE — Progress Notes (Signed)
MFM follow up --preconception consultation follow up due to history of 27w delivery in setting of HELLP syndrome.  [name] returned today to review APLAS labs---all labs were negative.   We also addressed weight reduction as a means of decreasing her recurrence risk for preclampsia.  She lost 100lbs following the delivery of her first child (weight decreased from 350lbs to 250) She is currently at 282 lbs. We made a referral to [facility] for nutrition counseling.  She will begin her exercise regimen again and we set a goal of 50lb weight loss--she has had significant weight loss in the past and is motivated.

## 2015-04-01 ENCOUNTER — Encounter: Payer: Medicaid Other | Attending: Family Medicine | Admitting: Dietician

## 2015-04-01 DIAGNOSIS — K219 Gastro-esophageal reflux disease without esophagitis: Secondary | ICD-10-CM | POA: Diagnosis not present

## 2015-04-01 NOTE — Patient Instructions (Signed)
   Eat a small, light breakfast daily. Can be a breakfast drink or smoothie, or a healthy snack. Doesn't have to be breakfast food.   Increase vegetable intake; plan to have a salad or broccoli daily. Find ways to sneak in extra vegetables with meals or snacks.   Use fruit for some snacks and with meals for great low calorie nutrition.   Work on controlling food portions: try using small plates and bowls, avoid eating straight from a large bag or container. Measure some food portions, especially starchy foods. Aim for 1 cup (fist-size) or less of starches.

## 2015-04-01 NOTE — Progress Notes (Signed)
Medical Nutrition Therapy: Visit start time: 1030  end time: 1130  Assessment:  Diagnosis: obesity Past medical history: GERD per patient, pre-eclampsia with pregnancy in 2010 Psychosocial issues/ stress concerns: patient reports moderate stress level, uses journaling to manage.  Preferred learning method:  Jill Alexanders. Visual . Hands-on  Current weight: 286.6lbs  Height: 5'1" Medications, supplements: reviewed list in medical record with patient  Progress and evaluation: Patient reports some history of dieting, tried smoothies 2x daily with 1 healthy meal. Became bland per patient, no variety.         She wants to have another baby but had a difficult pregnancy before.         She has been working to decrease intake of fried foods, and drink more water.    Physical activity: waking 30-60 minutes, 2-3 times per week; somewhat limited due to back and knee pain (she would like to be able to do weight training).   Dietary Intake:  Usual eating pattern includes 2 meals and 1-2 snacks per day. Dining out frequency: 8 meals per week.  Breakfast: none  Snack: none Lunch: 12-1pm some fast foods-- chicken sandwich or burger and fries; sometimes leftovers at home.  Snack: chips or candy bar or similar Supper: baked meats and rice or pasta, no veg usually -- doesn't like veg other than salad or broccoli Snack: occasional snacking if stressed; ice cream or chips or candy Beverages: water with lemon juice, ginger ale, sweet tea  Nutrition Care Education: Topics covered: weight management Basic nutrition: basic food groups, appropriate nutrient balance, appropriate meal and snack schedule, general nutrition guidelines    Weight control: benefits of weight control, behavioral changes for weight loss, importance of choosing low fat and low sugar foods,        1400kcal meal plan with 50%CHO, 20% protein, and 30% fat; portion control.  Advanced nutrition: dining out  Nutritional Diagnosis:  [place]-3.3  Overweight/obesity As related to excess caloric intake, history of low activity level.  As evidenced by patient report, high BMI.  Intervention: Instruction as noted above.    Set goals with patient's input.    Discussed benefits of tracking food intake using phone app.   Education Materials given:  . Food lists/ Planning A Balanced Meal . Sample meal pattern/ menus: Quick and Healthy Meal Ideas . Snacking handout . Goals/ instructions . Increasing Fruits and Veggies  Supermarket road map  Learner/ who was taught:  . Patient   Level of understanding: Marland Kitchen. Verbalizes/ demonstrates competency  Demonstrated degree of understanding via:   Teach back Learning barriers: . None  Willingness to learn/ readiness for change: . Eager, change in progress  Monitoring and Evaluation:  Dietary intake, exercise, and body weight      follow up: 04/27/15

## 2015-04-08 ENCOUNTER — Encounter: Payer: Self-pay | Admitting: Family Medicine

## 2015-04-08 ENCOUNTER — Ambulatory Visit (INDEPENDENT_AMBULATORY_CARE_PROVIDER_SITE_OTHER): Payer: Medicaid Other | Admitting: Family Medicine

## 2015-04-08 VITALS — BP 120/88 | HR 89 | Temp 98.9°F | Resp 16 | Ht 60.0 in | Wt 287.0 lb

## 2015-04-08 DIAGNOSIS — M25511 Pain in right shoulder: Secondary | ICD-10-CM | POA: Diagnosis not present

## 2015-04-08 MED ORDER — CYCLOBENZAPRINE HCL 5 MG PO TABS: 5.0000 mg | ORAL_TABLET | Freq: Three times a day (TID) | ORAL | Status: DC | PRN

## 2015-04-08 MED ORDER — PREDNISONE 20 MG PO TABS: ORAL_TABLET | ORAL | Status: AC

## 2015-04-08 NOTE — Patient Instructions (Signed)
Apply ice pack  Every four hours for the next 3 days

## 2015-04-08 NOTE — Progress Notes (Signed)
Patient: [name] Female    DOB: [birth year]   29 y.o.   MRN: [id number] Visit Date: 04/08/2015  Today's Provider: Mila Merry, MD   Chief Complaint  Patient presents with  . Shoulder Pain   Subjective:    Shoulder Pain  The pain is present in the neck, right wrist, right hand, right fingers, right shoulder, right arm and right elbow. This is a new problem. The current episode started in the past 7 days. There has been no history of extremity trauma. The problem occurs constantly. The problem has been gradually worsening. The quality of the pain is described as burning and sharp. The pain is at a severity of 9/10. The pain is severe. Associated symptoms include a limited range of motion, numbness, stiffness and tingling. Pertinent negatives include no fever or itching. Associated symptoms comments: chills. The symptoms are aggravated by contact, activity and standing. She has tried heat and NSAIDS for the symptoms. The treatment provided no relief.   Pain started 6 days in front of right shoulder progressing to numbness and tingling in arm and hand. Then developed pain on side of neck. No relief from ibuprofen and heat pad.     Allergies  Allergen Reactions  . Lactose Intolerance (Gi) Diarrhea  . Penicillins Hives  . Pineapple Other (See Comments)    Mouth sores   Previous Medications   ALBUTEROL (PROAIR HFA) 108 (90 BASE) MCG/ACT INHALER    Inhale into the lungs.   ESTRADIOL CYPIONATE (DEPO-ESTRADIOL) 5 MG/ML INJECTION    Inject into the muscle every 3 (three) months.   PANTOPRAZOLE (PROTONIX) 40 MG TABLET    Take 1 tablet (40 mg total) by mouth daily.    Review of Systems  Constitutional: Negative for fever.  Respiratory: Negative for chest tightness and shortness of breath.   Cardiovascular: Negative for chest pain and palpitations.  Musculoskeletal: Positive for myalgias, stiffness, neck pain and neck stiffness.  Skin: Negative for itching.  Neurological:  Positive for tingling and numbness.    Social History  Substance Use Topics  . Smoking status: Light Tobacco Smoker  . Smokeless tobacco: Never Used     Comment: smokes cigars  . Alcohol Use: Yes     Comment: ocassional alcohol use; Mixed drinks once every 2-3 months   Objective:   BP 120/88 mmHg  Pulse 89  Temp(Src) 98.9 F (37.2 C) (Oral)  Resp 16  Ht 5' (1.524 m)  Wt 287 lb (130.182 kg)  BMI 56.05 kg/m2  SpO2 99%  Physical Exam  General appearance: alert, well developed, well nourished, cooperative and in no distress, obese Head: Normocephalic, without obvious abnormality, atraumatic MS: Moderate tenderness superior and anterior aspect of musculature of right shoulder. No spine tenderness. Abduction and anterior flexion limited to 90 degree due to pain. Limited internal and external rotation due to pain.  Extremities: No gross deformities Skin: Skin color, texture, turgor normal. No rashes seen  Psych: Appropriate mood and affect. Neurologic: Mental status: Alert, oriented to person, place, and time, thought content appropriate.     Assessment & Plan:     1. Shoulder pain, acute, right  - cyclobenzaprine (FLEXERIL) 5 MG tablet; Take 1-2 tablets (5-10 mg total) by mouth 3 (three) times daily as needed for muscle spasms.  Dispense: 30 tablet; Refill: 1 - predniSONE (DELTASONE) 20 MG tablet; One tablet three times a day for 3 days, then one tablet twice a day for 3 days, then one tablet  a day for 3 days.  Dispense: 18 tablet; Refill: 0  Patient Instructions  Apply ice pack  Every four hours for the next 3 days   Consider referral to orthopedics if not much better over the weekend.       Mila Merryonald Rocklyn Mayberry, MD  [facility]

## 2015-04-27 ENCOUNTER — Ambulatory Visit: Payer: Medicaid Other | Admitting: Dietician

## 2015-05-05 ENCOUNTER — Encounter: Payer: Self-pay | Admitting: Family Medicine

## 2015-05-05 ENCOUNTER — Telehealth: Payer: Self-pay | Admitting: Family Medicine

## 2015-05-05 ENCOUNTER — Ambulatory Visit (INDEPENDENT_AMBULATORY_CARE_PROVIDER_SITE_OTHER): Payer: Medicaid Other | Admitting: Family Medicine

## 2015-05-05 VITALS — BP 110/78 | HR 96 | Temp 98.3°F | Resp 16 | Wt 286.0 lb

## 2015-05-05 DIAGNOSIS — N760 Acute vaginitis: Secondary | ICD-10-CM | POA: Diagnosis not present

## 2015-05-05 DIAGNOSIS — Z8632 Personal history of gestational diabetes: Secondary | ICD-10-CM | POA: Diagnosis not present

## 2015-05-05 LAB — POCT GLYCOSYLATED HEMOGLOBIN (HGB A1C)
Est. average glucose Bld gHb Est-mCnc: 105
HEMOGLOBIN A1C: 5.3

## 2015-05-05 MED ORDER — FLUCONAZOLE 150 MG PO TABS
150.0000 mg | ORAL_TABLET | Freq: Every day | ORAL | Status: DC
Start: 2015-05-05 — End: 2015-06-30

## 2015-05-05 NOTE — Telephone Encounter (Signed)
Pt advised.  She was tested for HIV in 08/2014 (See Allscripts.)  Thanks,   -Vernona RiegerLaura

## 2015-05-05 NOTE — Telephone Encounter (Signed)
Please call patient. Also recommend patient get tested for HIV if has not been tested in the last year. Thanks.

## 2015-05-05 NOTE — Progress Notes (Signed)
Subjective:    Patient ID: [name], female    DOB: 12/10/1985, 29 y.o.   MRN: [id number]  Vaginal Itching The patient's primary symptoms include genital itching, genital lesions (Pt reports she experiences "boils" on her labia. Is questioning if this could be from using [place] vs a lymph node) and vaginal discharge. The patient's pertinent negatives include no genital odor, genital rash, missed menses, pelvic pain or vaginal bleeding. This is a new problem. The current episode started in the past 7 days (x 3 days). The problem has been gradually worsening. The patient is experiencing no pain. She is not pregnant. Associated symptoms include back pain and headaches. Pertinent negatives include no abdominal pain, chills, constipation, diarrhea, discolored urine, dysuria, fever, frequency, hematuria or urgency. The vaginal discharge was white ("cottage cheese" appearance). She is sexually active. She uses progestin injections for contraception.      Review of Systems  Constitutional: Negative for fever and chills.  Gastrointestinal: Negative for abdominal pain, diarrhea and constipation.  Genitourinary: Positive for vaginal discharge. Negative for dysuria, urgency, frequency, hematuria, pelvic pain and missed menses.  Musculoskeletal: Positive for back pain.  Neurological: Positive for headaches.   BP 110/78 mmHg  Pulse 96  Temp(Src) 98.3 F (36.8 C) (Oral)  Resp 16  Wt 286 lb (129.729 kg)   Patient Active Problem List   Diagnosis Date Noted  . Pre-eclampsia, delivered 02/12/2015  . Abdominal pain 02/05/2015  . Abnormal vaginal Pap smear 02/05/2015  . Abortion, spontaneous 02/05/2015  . Anxiety 02/05/2015  . LBP (low back pain) 02/05/2015  . Body mass index of 60 or higher (HCC) 02/05/2015  . Blood pressure elevated 02/05/2015  . Fatigue 02/05/2015  . Fibrositis 02/05/2015  . Folliculitis 02/05/2015  . Acid reflux 02/05/2015  . Diabetes mellitus arising in pregnancy  02/05/2015  . High risk sexual behavior 02/05/2015  . 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/2015  . Obesity 02/05/2015  . Obstructive sleep apnea 02/05/2015  . Burning or prickling sensation 02/05/2015  . Abdominal pain, right lower quadrant 02/05/2015  . Infection of urinary tract 02/05/2015  . Abnormal vaginal bleeding 02/05/2015  . Candida vaginitis 02/05/2015  . Vitamin D deficiency 02/05/2015  . Depression 02/05/2015  . Fibromyalgia 02/05/2015  . Abnormal C-reactive protein 10/24/2012  . Chronic LBP 08/13/2012  . Absence of menstruation 09/22/2009  . Migraine without status migrainosus 07/22/2008   Past Medical History  Diagnosis Date  . Depression   . Anxiety   . GERD (gastroesophageal reflux disease)   . Sleep apnea   . Elevated blood pressure    Current Outpatient Prescriptions on File Prior to Visit  Medication Sig  . albuterol (PROAIR HFA) 108 (90 BASE) MCG/ACT inhaler Inhale into the lungs.  Marland Kitchen estradiol cypionate (DEPO-ESTRADIOL) 5 MG/ML injection Inject into the muscle every 3 (three) months.  . pantoprazole (PROTONIX) 40 MG tablet Take 1 tablet (40 mg total) by mouth daily.  . cyclobenzaprine (FLEXERIL) 5 MG tablet Take 1-2 tablets (5-10 mg total) by mouth 3 (three) times daily as needed for muscle spasms. (Patient not taking: Reported on 05/05/2015)   No current facility-administered medications on file prior to visit.   Allergies  Allergen Reactions  . Lactose Intolerance (Gi) Diarrhea  . Penicillins Hives  . Pineapple Other (See Comments)    Mouth sores   Past Surgical History  Procedure Laterality Date  . Headache:2009      Headache wellness consult. during pregnancy  . Dilationand curettage of uterus  2007  SAB  . Cesarean section      at 28 weeks for pre-eclampsia, pulmonary edema, gestational DM.   Social History   Social History  . Marital Status: Single    Spouse Name: N/A  . Number of Children: 1  . Years of Education: high schoo   Occupational  History  . Not on file.   Social History Main Topics  . Smoking status: Light Tobacco Smoker  . Smokeless tobacco: Never Used     Comment: smokes cigars  . Alcohol Use: Yes     Comment: ocassional alcohol use; Mixed drinks once every 2-3 months  . Drug Use: No  . Sexual Activity: Yes   Other Topics Concern  . Not on file   Social History Narrative   Family History  Problem Relation Age of Onset  . Hypertension Maternal Grandmother       Objective:   Physical Exam  Constitutional: She is oriented to person, place, and time. She appears well-developed and well-nourished.  Genitourinary: Vaginal discharge found.  Neurological: She is alert and oriented to person, place, and time.    BP 110/78 mmHg  Pulse 96  Temp(Src) 98.3 F (36.8 C) (Oral)  Resp 16  Wt 286 lb (129.729 kg)     Assessment & Plan:  1. Vaginitis New problem. Suspect candida. Will treat. Also send for evaluation.  Also, will check for HIV.   - NuSwab Vaginitis Plus (VG+) - fluconazole (DIFLUCAN) 150 MG tablet; Take 1 tablet (150 mg total) by mouth daily. And repeat in one week.  Dispense: 2 tablet; Refill: 5  2. History of gestational diabetes Will check labs.   Ruled out for diabetes.  Continue to monitor.  - POCT glycosylated hemoglobin (Hb A1C)

## 2015-05-07 LAB — NUSWAB VAGINITIS PLUS (VG+)
CANDIDA ALBICANS, NAA: POSITIVE — AB
CANDIDA GLABRATA, NAA: NEGATIVE
Chlamydia trachomatis, NAA: NEGATIVE
MEGASPHAERA 1: HIGH {score} — AB
NEISSERIA GONORRHOEAE, NAA: NEGATIVE
Trich vag by NAA: NEGATIVE

## 2015-05-08 ENCOUNTER — Telehealth: Payer: Self-pay | Admitting: Family Medicine

## 2015-05-08 ENCOUNTER — Telehealth: Payer: Self-pay

## 2015-05-08 NOTE — Telephone Encounter (Signed)
Pt is requesting her lab results.    Pt also states she is still have the itching and burning.  Pt is requesting another Rx to help with this.  [facility] [address]  WU#[phone]/NFCB#[phone]/MW

## 2015-05-08 NOTE — Telephone Encounter (Signed)
Pt advised to take her second Diflucan.   Thanks,   -Vernona RiegerLaura

## 2015-05-08 NOTE — Telephone Encounter (Signed)
-----   Message from Lorie PhenixNancy Maloney, MD sent at 05/07/2015  3:18 PM EST ----- Nu swab showed no STDs.  Did show yeast as suspected. Sensitive to Diflucan. Please see how patient is doing. Thanks.

## 2015-05-08 NOTE — Telephone Encounter (Signed)
Pt advised; She says she still is having itching and burning.  Per Dr. Elease HashimotoMaloney, I advised pt to take the second Diflucan.   Thanks,   -Vernona RiegerLaura

## 2015-05-14 ENCOUNTER — Ambulatory Visit (INDEPENDENT_AMBULATORY_CARE_PROVIDER_SITE_OTHER): Payer: Medicaid Other | Admitting: Family Medicine

## 2015-05-14 ENCOUNTER — Encounter: Payer: Self-pay | Admitting: Family Medicine

## 2015-05-14 VITALS — BP 124/68 | HR 84 | Temp 99.0°F | Resp 16 | Wt 285.0 lb

## 2015-05-14 DIAGNOSIS — J0110 Acute frontal sinusitis, unspecified: Secondary | ICD-10-CM

## 2015-05-14 MED ORDER — FLUCONAZOLE 150 MG PO TABS: 150.0000 mg | ORAL_TABLET | Freq: Once | ORAL | Status: DC

## 2015-05-14 MED ORDER — DOXYCYCLINE HYCLATE 100 MG PO TABS: 100.0000 mg | ORAL_TABLET | Freq: Two times a day (BID) | ORAL | Status: DC

## 2015-05-14 NOTE — Progress Notes (Signed)
Patient ID: [name], female   DOB: 08/17/1985, 29 y.o.   MRN: [id number]         Patient: [name] Female    DOB: 01/03/1986   29 y.o.   MRN: [id number] Visit Date: 05/14/2015  Today's Provider: Lorie PhenixNancy Demonte Dobratz, MD   Chief Complaint  Patient presents with  . Sinusitis   Subjective:    Sinusitis This is a new problem. The current episode started in the past 7 days. The problem has been gradually worsening since onset. There has been no fever. Associated symptoms include chills, congestion, coughing, diaphoresis, ear pain (Comes and goes), headaches, sinus pressure and a sore throat. Pertinent negatives include no neck pain, shortness of breath or sneezing. Past treatments include oral decongestants. The treatment provided no relief (Does not feel  like a URI. ).   Vaginal discharge better.      Allergies  Allergen Reactions  . Lactose Intolerance (Gi) Diarrhea  . Penicillins Hives  . Pineapple Other (See Comments)    Mouth sores   Previous Medications   ALBUTEROL (PROAIR HFA) 108 (90 BASE) MCG/ACT INHALER    Inhale into the lungs.   CYCLOBENZAPRINE (FLEXERIL) 5 MG TABLET    Take 1-2 tablets (5-10 mg total) by mouth 3 (three) times daily as needed for muscle spasms.   ESTRADIOL CYPIONATE (DEPO-ESTRADIOL) 5 MG/ML INJECTION    Inject into the muscle every 3 (three) months.   FLUCONAZOLE (DIFLUCAN) 150 MG TABLET    Take 1 tablet (150 mg total) by mouth daily. And repeat in one week.   PANTOPRAZOLE (PROTONIX) 40 MG TABLET    Take 1 tablet (40 mg total) by mouth daily.    Review of Systems  Constitutional: Positive for chills, diaphoresis and fatigue. Negative for fever, activity change, appetite change and unexpected weight change.  HENT: Positive for congestion, ear pain (Comes and goes), postnasal drip, rhinorrhea, sinus pressure, sore throat and voice change. Negative for ear discharge, nosebleeds, sneezing, tinnitus and trouble swallowing.   Eyes: Negative.     Respiratory: Positive for cough and wheezing. Negative for apnea, choking, chest tightness, shortness of breath and stridor.   Cardiovascular: Negative for chest pain and leg swelling.  Gastrointestinal: Positive for nausea and diarrhea. Negative for vomiting, abdominal pain, constipation, blood in stool, abdominal distention, anal bleeding and rectal pain.  Genitourinary: Negative for vaginal discharge.  Musculoskeletal: Negative for myalgias, back pain, joint swelling, arthralgias, gait problem, neck pain and neck stiffness.  Neurological: Positive for headaches. Negative for dizziness and light-headedness.    Social History  Substance Use Topics  . Smoking status: Light Tobacco Smoker  . Smokeless tobacco: Never Used     Comment: smokes cigars  . Alcohol Use: Yes     Comment: ocassional alcohol use; Mixed drinks once every 2-3 months   Objective:   BP 124/68 mmHg  Pulse 84  Temp(Src) 99 F (37.2 C) (Oral)  Resp 16  Wt 285 lb (129.275 kg)  Physical Exam  Constitutional: She is oriented to person, place, and time. She appears well-developed and well-nourished.  HENT:  Head: Normocephalic and atraumatic.  Right Ear: Tympanic membrane and external ear normal.  Left Ear: Tympanic membrane and external ear normal.  Nose: Mucosal edema and rhinorrhea present. Right sinus exhibits maxillary sinus tenderness. Left sinus exhibits maxillary sinus tenderness.  Mouth/Throat: Uvula is midline and oropharynx is clear and moist.  Eyes: Conjunctivae and EOM are normal. Pupils are equal, round, and reactive to light.  Neck:  Normal range of motion. Neck supple.  Cardiovascular: Normal rate and regular rhythm.   Pulmonary/Chest: Effort normal and breath sounds normal. She has no wheezes. She has no rales.  Neurological: She is alert and oriented to person, place, and time.      Assessment & Plan:     1. Acute frontal sinusitis, recurrence not specified New problem. Condition is worsening.  Will start medication for better control.  Patient instructed to call back if condition worsens or does not improve.    - doxycycline (VIBRA-TABS) 100 MG tablet; Take 1 tablet (100 mg total) by mouth 2 (two) times daily.  Dispense: 20 tablet; Refill: 0 - fluconazole (DIFLUCAN) 150 MG tablet; Take 1 tablet (150 mg total) by mouth once. And repeat in one week.  Dispense: 2 tablet; Refill: 0      Lorie Phenix, MD  [facility]

## 2015-05-29 ENCOUNTER — Encounter: Payer: Self-pay | Admitting: Dietician

## 2015-05-29 NOTE — Progress Notes (Unsigned)
Have not heard from patient to reschedule; sent discharge letter to MD.

## 2015-06-15 ENCOUNTER — Encounter: Payer: Self-pay | Admitting: Physician Assistant

## 2015-06-15 ENCOUNTER — Ambulatory Visit (INDEPENDENT_AMBULATORY_CARE_PROVIDER_SITE_OTHER): Payer: Medicaid Other | Admitting: Physician Assistant

## 2015-06-15 VITALS — BP 110/60 | HR 73 | Temp 99.0°F | Resp 16 | Wt 291.0 lb

## 2015-06-15 DIAGNOSIS — S39012A Strain of muscle, fascia and tendon of lower back, initial encounter: Secondary | ICD-10-CM

## 2015-06-15 DIAGNOSIS — M62830 Muscle spasm of back: Secondary | ICD-10-CM | POA: Diagnosis not present

## 2015-06-15 MED ORDER — BACLOFEN 10 MG PO TABS: 10.0000 mg | ORAL_TABLET | Freq: Three times a day (TID) | ORAL | Status: DC

## 2015-06-15 MED ORDER — MELOXICAM 15 MG PO TABS: 15.0000 mg | ORAL_TABLET | Freq: Every day | ORAL | Status: DC

## 2015-06-15 NOTE — Progress Notes (Signed)
Patient: [name] Female    DOB: Mar 08, 1986   30 y.o.   MRN: [id number] Visit Date: 06/15/2015  Today's Provider: Margaretann Loveless, [name]   Chief Complaint  Patient presents with  . Back Pain   Subjective:    Back Pain This is a new problem. The current episode started yesterday. The problem occurs constantly. The problem has been gradually worsening since onset. Pain location: Right side of the back. The quality of the pain is described as shooting, stabbing and aching. The pain does not radiate. The pain is at a severity of 10/10. The pain is the same all the time. The symptoms are aggravated by twisting, sitting, standing, position, lying down, bending and coughing. Stiffness is present in the morning. Pertinent negatives include no abdominal pain, dysuria, leg pain, numbness, pelvic pain or weakness. She has tried ice and heat (massage) for the symptoms.  She does not remember any particular injury and states that it just gradually worsened over the day yesterday.    Allergies  Allergen Reactions  . Lactose Intolerance (Gi) Diarrhea  . Penicillins Hives  . Pineapple Other (See Comments)    Mouth sores   Previous Medications   ALBUTEROL (PROAIR HFA) 108 (90 BASE) MCG/ACT INHALER    Inhale into the lungs.   CYCLOBENZAPRINE (FLEXERIL) 5 MG TABLET    Take 1-2 tablets (5-10 mg total) by mouth 3 (three) times daily as needed for muscle spasms.   DOXYCYCLINE (VIBRA-TABS) 100 MG TABLET    Take 1 tablet (100 mg total) by mouth 2 (two) times daily.   ESTRADIOL CYPIONATE (DEPO-ESTRADIOL) 5 MG/ML INJECTION    Inject into the muscle every 3 (three) months.   FLUCONAZOLE (DIFLUCAN) 150 MG TABLET    Take 1 tablet (150 mg total) by mouth daily. And repeat in one week.   PANTOPRAZOLE (PROTONIX) 40 MG TABLET    Take 1 tablet (40 mg total) by mouth daily.    Review of Systems  Constitutional: Negative.   Respiratory: Negative.   Cardiovascular: Negative.   Gastrointestinal:  Negative for nausea, vomiting, abdominal pain, diarrhea and constipation.  Genitourinary: Negative for dysuria, urgency, frequency, hematuria, flank pain, vaginal bleeding, vaginal discharge, vaginal pain and pelvic pain.  Musculoskeletal: Positive for back pain. Negative for gait problem and neck pain.  Allergic/Immunologic: Negative.   Neurological: Negative for weakness and numbness.    Social History  Substance Use Topics  . Smoking status: Light Tobacco Smoker  . Smokeless tobacco: Never Used     Comment: smokes cigars  . Alcohol Use: Yes     Comment: ocassional alcohol use; Mixed drinks once every 2-3 months   Objective:   BP 110/60 mmHg  Pulse 73  Temp(Src) 99 F (37.2 C) (Oral)  Resp 16  Wt 291 lb (131.997 kg)  Physical Exam  Constitutional: She appears well-developed and well-nourished. No distress.  Neck: Normal range of motion. Neck supple. No JVD present. No tracheal deviation present. No thyromegaly present.  Cardiovascular: Normal rate, regular rhythm and normal heart sounds.  Exam reveals no gallop and no friction rub.   No murmur heard. Pulmonary/Chest: Effort normal and breath sounds normal. No respiratory distress. She has no wheezes. She has no rales.  Musculoskeletal:       Cervical back: Normal.       Thoracic back: She exhibits decreased range of motion, tenderness and spasm (right side). She exhibits no bony tenderness.       Lumbar  back: She exhibits decreased range of motion. She exhibits no tenderness and no bony tenderness.  Lymphadenopathy:    She has no cervical adenopathy.  Skin: She is not diaphoretic.  Vitals reviewed.       Assessment & Plan:     1. Back muscle spasm There was spasm noted in the right paraspinal muscles in the thoracic region. She also was tender to palpation in this area. Possible muscle strain as well due to unknown cause. There was no bony tenderness and no radiating symptoms. Will treat with anti-inflammatory and muscle  relaxer as below. Advised that she should use moist heat on the area as well 3 times a day for 15 minutes at a time. She can also do Epson salt soaks for 15-20 minutes daily. I also gave her back exercises and stretches that she may begin once the soreness starts to improve. She is to call the office if symptoms fail to improve or worsen. - baclofen (LIORESAL) 10 MG tablet; Take 1 tablet (10 mg total) by mouth 3 (three) times daily.  Dispense: 30 each; Refill: 0 - meloxicam (MOBIC) 15 MG tablet; Take 1 tablet (15 mg total) by mouth daily.  Dispense: 15 tablet; Refill: 0  2. Strain of muscle, fascia and tendon of lower back, initial encounter See above medical treatment plan. - baclofen (LIORESAL) 10 MG tablet; Take 1 tablet (10 mg total) by mouth 3 (three) times daily.  Dispense: 30 each; Refill: 0 - meloxicam (MOBIC) 15 MG tablet; Take 1 tablet (15 mg total) by mouth daily.  Dispense: 15 tablet; Refill: 0       Margaretann Loveless, [name]  [facility]

## 2015-06-15 NOTE — Patient Instructions (Signed)

## 2015-06-16 ENCOUNTER — Telehealth: Payer: Self-pay | Admitting: Family Medicine

## 2015-06-16 NOTE — Telephone Encounter (Signed)
Spoke with patient and she states is not sure that is an allergic reaction. Patient took Baclofen and Meloxicam together last night and this morning at 7:50 am. Per patient woke up at 10 am and like around 10:30 patient started to have Blurry Vision, arms feel weak , tingling and numbness and tingling in her feet. Feeling more tired, pain on her left shoulder is constant. Has headache in her temporal area  And lightheadedness. No chest pain, no hives.   Please Advise.  Thanks,  -Zandon Talton

## 2015-06-16 NOTE — Telephone Encounter (Signed)
Pt called saying she was in yesterday and recd. Medication and thinks she may be having a reaction.  She is having ringing in her ears and lightheadedness.  Please advise [phone]  Thanks Barth Kirks

## 2015-06-16 NOTE — Telephone Encounter (Signed)
Have her stop baclofen.  Make sure to only take meloxicam once daily. See if symptoms improve. If not stop both and return to see me or go to hospital if after hours.

## 2015-06-16 NOTE — Telephone Encounter (Signed)
Pt advised as directed below.   Thanks,   -Torri Michalski  

## 2015-06-30 ENCOUNTER — Ambulatory Visit (INDEPENDENT_AMBULATORY_CARE_PROVIDER_SITE_OTHER): Payer: Medicaid Other | Admitting: Family Medicine

## 2015-06-30 ENCOUNTER — Encounter: Payer: Self-pay | Admitting: Family Medicine

## 2015-06-30 VITALS — BP 114/70 | HR 64 | Temp 98.2°F | Resp 16 | Wt 284.4 lb

## 2015-06-30 DIAGNOSIS — R5383 Other fatigue: Secondary | ICD-10-CM | POA: Diagnosis not present

## 2015-06-30 DIAGNOSIS — S46812A Strain of other muscles, fascia and tendons at shoulder and upper arm level, left arm, initial encounter: Secondary | ICD-10-CM | POA: Diagnosis not present

## 2015-06-30 MED ORDER — MELOXICAM 15 MG PO TABS: 15.0000 mg | ORAL_TABLET | Freq: Every day | ORAL | Status: DC

## 2015-06-30 NOTE — Progress Notes (Signed)
Subjective:     Patient ID: [name], female   DOB: 1985-07-10, 30 y.o.   MRN: [id number]  HPI  Chief Complaint  Patient presents with  . Shoulder Pain    Patient comes in office today with concerns of left shoulder pain since 06/27/15. Patient denies any injury or over extension of arm, patient reports she has pain when doing over the head activities. Patient states that pain is now radiating up her neck into left side of face, patient has used otc Aleve and heating pad for relief.   Denies specific injury. 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 Remains on Depo.Additionally reports general fatigue, unrefreshing sleep and blurry vision with facial paresthesias. Denies depression.   Review of Systems  Respiratory:       Hx of OSA 2014 sleep study       Objective:   Physical Exam  Constitutional: She appears well-developed and well-nourished. No distress.  Musculoskeletal:  Left shoulder strength 5/5 with FROM. Cervical ROM mildly limited laterally. Tender over her left upper trapezius/ posterior cervical area.       Assessment:    1. Trapezius strain, left, initial encounter - meloxicam (MOBIC) 15 MG tablet; Take 1 tablet (15 mg total) by mouth daily. With food  Dispense: 30 tablet; Refill: 0  2. Other fatigue: Epworth screen provided. - CBC with Differential/Platelet - TSH - Comprehensive metabolic panel - T4, free - HgB A1c    Plan:    Further f/u pending results of the above. May use heating pad for muscle spasms.

## 2015-06-30 NOTE — Patient Instructions (Signed)
Please complete sleep apnea screen. We will call you with the lab results. If you are having spasms in your neck try heating pad for 20 minutes as needed.

## 2015-07-01 ENCOUNTER — Other Ambulatory Visit: Payer: Self-pay | Admitting: Family Medicine

## 2015-07-01 ENCOUNTER — Telehealth: Payer: Self-pay

## 2015-07-01 DIAGNOSIS — G4733 Obstructive sleep apnea (adult) (pediatric): Secondary | ICD-10-CM

## 2015-07-01 LAB — CBC WITH DIFFERENTIAL/PLATELET
BASOS ABS: 0 10*3/uL (ref 0.0–0.2)
Basos: 1 %
EOS (ABSOLUTE): 0.1 10*3/uL (ref 0.0–0.4)
Eos: 2 %
HEMATOCRIT: 37.7 % (ref 34.0–46.6)
Hemoglobin: 13 g/dL (ref 11.1–15.9)
IMMATURE GRANULOCYTES: 0 %
Immature Grans (Abs): 0 10*3/uL (ref 0.0–0.1)
Lymphocytes Absolute: 2.2 10*3/uL (ref 0.7–3.1)
Lymphs: 42 %
MCH: 30.6 pg (ref 26.6–33.0)
MCHC: 34.5 g/dL (ref 31.5–35.7)
MCV: 89 fL (ref 79–97)
MONOS ABS: 0.3 10*3/uL (ref 0.1–0.9)
Monocytes: 6 %
NEUTROS PCT: 49 %
Neutrophils Absolute: 2.6 10*3/uL (ref 1.4–7.0)
Platelets: 285 10*3/uL (ref 150–379)
RBC: 4.25 x10E6/uL (ref 3.77–5.28)
RDW: 13.5 % (ref 12.3–15.4)
WBC: 5.2 10*3/uL (ref 3.4–10.8)

## 2015-07-01 LAB — COMPREHENSIVE METABOLIC PANEL
ALBUMIN: 4.4 g/dL (ref 3.5–5.5)
ALK PHOS: 74 IU/L (ref 39–117)
ALT: 12 IU/L (ref 0–32)
AST: 15 IU/L (ref 0–40)
Albumin/Globulin Ratio: 1.7 (ref 1.1–2.5)
BILIRUBIN TOTAL: 0.3 mg/dL (ref 0.0–1.2)
BUN/Creatinine Ratio: 10 (ref 8–20)
BUN: 8 mg/dL (ref 6–20)
CHLORIDE: 101 mmol/L (ref 96–106)
CO2: 22 mmol/L (ref 18–29)
CREATININE: 0.79 mg/dL (ref 0.57–1.00)
Calcium: 9.6 mg/dL (ref 8.7–10.2)
GFR calc Af Amer: 116 mL/min/{1.73_m2} (ref >59)
GFR calc non Af Amer: 101 mL/min/{1.73_m2} (ref >59)
GLUCOSE: 80 mg/dL (ref 65–99)
Globulin, Total: 2.6 g/dL (ref 1.5–4.5)
Potassium: 5 mmol/L (ref 3.5–5.2)
Sodium: 141 mmol/L (ref 134–144)
TOTAL PROTEIN: 7 g/dL (ref 6.0–8.5)

## 2015-07-01 LAB — HEMOGLOBIN A1C
ESTIMATED AVERAGE GLUCOSE: 100 mg/dL
HEMOGLOBIN A1C: 5.1 % (ref 4.8–5.6)

## 2015-07-01 LAB — T4, FREE: FREE T4: 1.35 ng/dL (ref 0.82–1.77)

## 2015-07-01 LAB — TSH: TSH: 2.26 u[IU]/mL (ref 0.450–4.500)

## 2015-07-01 NOTE — Telephone Encounter (Signed)
-----   Message from Anola Gurney, [place] sent at 07/01/2015  7:39 AM EST ----- Your labs look good-no diabetes or anemia, thyroid is normal. You had been diagnosed with sleep apnea in the past-Did you ever start treatment for that with CPAP?

## 2015-07-01 NOTE — Telephone Encounter (Signed)
Has been off CPAP for at least 2 years. Will repeat titration study. Discussed following up with Dr. Elease Hashimoto regarding multiple somatic sx.

## 2015-07-01 NOTE — Telephone Encounter (Signed)
Patient advised as directed below. Patient verbalized understanding.  Patient states she has started treatment with CPAP. Patient also wanted to let you know she feels her face is filling with fluid also.

## 2015-07-02 ENCOUNTER — Other Ambulatory Visit: Payer: Self-pay | Admitting: Family Medicine

## 2015-09-10 ENCOUNTER — Ambulatory Visit (INDEPENDENT_AMBULATORY_CARE_PROVIDER_SITE_OTHER): Payer: Medicaid Other | Admitting: Family Medicine

## 2015-09-10 ENCOUNTER — Encounter: Payer: Self-pay | Admitting: Family Medicine

## 2015-09-10 VITALS — BP 122/86 | HR 80 | Temp 98.1°F | Resp 16 | Ht 60.0 in | Wt 266.0 lb

## 2015-09-10 DIAGNOSIS — M545 Low back pain, unspecified: Secondary | ICD-10-CM

## 2015-09-10 DIAGNOSIS — F32A Depression, unspecified: Secondary | ICD-10-CM

## 2015-09-10 DIAGNOSIS — J302 Other seasonal allergic rhinitis: Secondary | ICD-10-CM | POA: Diagnosis not present

## 2015-09-10 DIAGNOSIS — Z0000 Encounter for general adult medical examination without abnormal findings: Secondary | ICD-10-CM

## 2015-09-10 DIAGNOSIS — F329 Major depressive disorder, single episode, unspecified: Secondary | ICD-10-CM | POA: Diagnosis not present

## 2015-09-10 DIAGNOSIS — G8929 Other chronic pain: Secondary | ICD-10-CM | POA: Diagnosis not present

## 2015-09-10 DIAGNOSIS — J4520 Mild intermittent asthma, uncomplicated: Secondary | ICD-10-CM | POA: Diagnosis not present

## 2015-09-10 DIAGNOSIS — Z7251 High risk heterosexual behavior: Secondary | ICD-10-CM

## 2015-09-10 MED ORDER — MONTELUKAST SODIUM 10 MG PO TABS: 10.0000 mg | ORAL_TABLET | Freq: Every day | ORAL | Status: DC

## 2015-09-10 MED ORDER — FLUOXETINE HCL 20 MG PO TABS: 20.0000 mg | ORAL_TABLET | Freq: Every day | ORAL | Status: DC

## 2015-09-10 NOTE — Progress Notes (Signed)
Patient ID: [name], female   DOB: [birth year], 30 y.o.   MRN: [id number]       Patient: [name], Female    DOB: 29-Sep-1985, 30 y.o.   MRN: [id number] Visit Date: 09/10/2015  Today's Provider: Lorie Phenix, MD   Chief Complaint  Patient presents with  . Annual Exam   Subjective:    Annual physical exam [name] is a 30 y.o. female who presents today for health maintenance and complete physical. She feels well. She reports exercising 2-3 times a day. She reports she is sleeping fairly well.  09/04/14 CPE 09/04/14 Pap-neg -----------------------------------------------------------------   Depression, Follow-up  She  was last seen for this several  years ago. Changes made at last visit include no changes.  Current symptoms include: depressed mood, fatigue and insomnia She feels she is Worseing for the last 3-4 months.  ------------------------------------------------------------------------  Neck and back pain: patient c/o pain for several years. Patient reports that she was being seen by [facility] last year. Patient reports that she tried going back to them but was told that they no longer accept her insurance.   Review of Systems  Constitutional: Negative.   HENT: Positive for sinus pressure.   Eyes: Positive for visual disturbance.  Respiratory: Positive for wheezing.   Cardiovascular: Negative.   Gastrointestinal: Positive for diarrhea and constipation.  Endocrine: Negative.   Genitourinary: Negative.   Musculoskeletal: Positive for myalgias, back pain, arthralgias, neck pain and neck stiffness.  Skin: Negative.   Allergic/Immunologic: Positive for environmental allergies and food allergies.  Neurological: Positive for headaches.  Hematological: Negative.   Psychiatric/Behavioral: Positive for sleep disturbance and agitation. The patient is nervous/anxious.     Social History      She  reports that she has been smoking.  She has  never used smokeless tobacco. She reports that she drinks alcohol. She reports that she does not use illicit drugs.       Social History   Social History  . Marital Status: Single    Spouse Name: N/A  . Number of Children: 1  . Years of Education: high schoo   Social History Main Topics  . Smoking status: Current Some Day Smoker  . Smokeless tobacco: Never Used     Comment: smokes cigars  . Alcohol Use: 0.0 oz/week    0 Standard drinks or equivalent per week     Comment: ocassional alcohol use; Mixed drinks once every 2-3 months  . Drug Use: No  . Sexual Activity: Yes   Other Topics Concern  . None   Social History Narrative    Past Medical History  Diagnosis Date  . Depression   . Anxiety   . GERD (gastroesophageal reflux disease)   . Sleep apnea   . Elevated blood pressure      Patient Active Problem List   Diagnosis Date Noted  . Acute frontal sinusitis 05/14/2015  . Pre-eclampsia, delivered 02/12/2015  . Abnormal vaginal Pap smear 02/05/2015  . Abortion, spontaneous 02/05/2015  . Anxiety 02/05/2015  . LBP (low back pain) 02/05/2015  . Body mass index of 60 or higher (HCC) 02/05/2015  . Fatigue 02/05/2015  . Fibrositis 02/05/2015  . Folliculitis 02/05/2015  . Acid reflux 02/05/2015  . Diabetes mellitus arising in pregnancy 02/05/2015  . High risk sexual behavior 02/05/2015  . 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/2015  . Obesity 02/05/2015  . Obstructive sleep apnea 02/05/2015  . Vitamin D deficiency 02/05/2015  . Depression 02/05/2015  . Fibromyalgia  02/05/2015  . Abnormal C-reactive protein 10/24/2012  . Chronic LBP 08/13/2012  . Absence of menstruation 09/22/2009  . Migraine without status migrainosus 07/22/2008    Past Surgical History  Procedure Laterality Date  . Headache:2009      Headache wellness consult. during pregnancy  . Dilationand curettage of uterus  2007    SAB  . Cesarean section      at 28 weeks for pre-eclampsia, pulmonary edema,  gestational DM.    Family History        Family Status  Relation Status Death Age  . Mother Alive     PUD'S  . Father Alive     not part of life  . Daughter Alive   . Maternal Grandmother Alive     CAD, Glaucoma  . Maternal Grandfather Deceased [id number]    died from prostate cancer; Alzheimer's  . Paternal Grandmother Deceased 5670    died of unknown causes  . Paternal Grandfather Deceased     died of unknown causes        Her family history includes Hypertension in her maternal grandmother.    Allergies  Allergen Reactions  . Lactose Intolerance (Gi) Diarrhea  . Penicillins Hives  . Pineapple Other (See Comments)    Mouth sores    Previous Medications   ALBUTEROL (PROAIR HFA) 108 (90 BASE) MCG/ACT INHALER    Inhale into the lungs.   ESTRADIOL CYPIONATE (DEPO-ESTRADIOL) 5 MG/ML INJECTION    Inject into the muscle every 3 (three) months.   PANTOPRAZOLE (PROTONIX) 40 MG TABLET    Take 1 tablet (40 mg total) by mouth daily.    Patient Care Team: Lorie PhenixNancy Odas Ozer, MD as PCP - General (Family Medicine)     Objective:   Vitals: BP 122/86 mmHg  Pulse 80  Temp(Src) 98.1 F (36.7 C)  Resp 16  Ht 5' (1.524 m)  Wt 266 lb (120.657 kg)  BMI 51.95 kg/m2   Physical Exam  Constitutional: She is oriented to person, place, and time. She appears well-developed and well-nourished.  HENT:  Head: Normocephalic and atraumatic.  Right Ear: Tympanic membrane, external ear and ear canal normal.  Left Ear: Tympanic membrane, external ear and ear canal normal.  Nose: Nose normal.  Mouth/Throat: Uvula is midline, oropharynx is clear and moist and mucous membranes are normal.  Eyes: Conjunctivae, EOM and lids are normal. Pupils are equal, round, and reactive to light.  Neck: Trachea normal and normal range of motion. Neck supple. Carotid bruit is not present. No thyroid mass and no thyromegaly present.  Cardiovascular: Normal rate, regular rhythm and normal heart sounds.   Pulmonary/Chest:  Effort normal and breath sounds normal.  Abdominal: Soft. Normal appearance and bowel sounds are normal. There is no hepatosplenomegaly. There is no tenderness.  Genitourinary: Vagina normal. No breast swelling, tenderness or discharge.  Musculoskeletal: Normal range of motion.  Lymphadenopathy:    She has no cervical adenopathy.    She has no axillary adenopathy.  Neurological: She is alert and oriented to person, place, and time. She has normal strength. No cranial nerve deficit.  Skin: Skin is warm, dry and intact.  Psychiatric: She has a normal mood and affect. Her speech is normal and behavior is normal. Judgment and thought content normal. Cognition and memory are normal.     Depression Screen PHQ 2/9 Scores 09/10/2015 04/01/2015  PHQ - 2 Score 2 0  PHQ- 9 Score 7 -      Assessment & Plan:  Routine Health Maintenance and Physical Exam  Exercise Activities and Dietary recommendations Goals    None      Immunization History  Administered Date(s) Administered  . HPV Quadrivalent 04/20/2011       1. Annual physical exam Stable. Patient advised to continue eating healthy and exercise daily.  2. High risk sexual behavior F/U pending lab report. - Pap IG, CT/NG NAA, and HPV (high risk)  3. Chronic LBP Recurrent. Worsening. Patient to call us back with name of new chiropractic.  4. Depression Recurrent. Worsening. No SI/HI.  Patient started on Fluoxetine 20 mg as below. Patient to follow-up in 4 weeks or sooner if symptoms are not improving or worsening. - FLUoxetine (PROZAC) 20 MG tablet; Take 1 tablet (20 mg total) by mouth daily.  Dispense: 30 tablet; Refill: 3  5. Other seasonal allergic rhinitis Worsening. Patient started on montelukast 10 mg as below. Patient to call if symptoms are not improving or worsening.  - montelukast (SINGULAIR) 10 MG tablet; Take 1 tablet (10 mg total) by mouth at bedtime.  Dispense: 30 tablet; Refill: 3  6. RAD (reactive airway  disease), mild intermittent, uncomplicated New problem. As above.Will add medication.   - montelukast (SINGULAIR) 10 MG tablet; Take 1 tablet (10 mg total) by mouth at bedtime.  Dispense: 30 tablet; Refill: 3     Patient seen and examined by Dr. Leo Grosser, and note scribed by Liz Beach. Dimas, CMA.  I have reviewed the document for accuracy and completeness and I agree with above. Leo Grosser, MD   Lorie Phenix, MD   --------------------------------------------------------------------

## 2015-09-14 LAB — PAP IG, CT-NG NAA, HPV HIGH-RISK
Chlamydia, Nuc. Acid Amp: NEGATIVE
Gonococcus by Nucleic Acid Amp: NEGATIVE
HPV, high-risk: NEGATIVE
PAP Smear Comment: 0

## 2015-09-15 ENCOUNTER — Telehealth: Payer: Self-pay

## 2015-09-15 NOTE — Telephone Encounter (Signed)
Advised pt of lab results. Pt verbally acknowledges understanding. Sherriann Szuch Drozdowski, CMA   

## 2015-09-15 NOTE — Telephone Encounter (Signed)
-----   Message from Lorie PhenixNancy Maloney, MD sent at 09/14/2015  4:47 PM EDT ----- Pap normal. HPV, GC and Chlamydia negative. Thanks.

## 2015-10-08 ENCOUNTER — Ambulatory Visit: Payer: Medicaid Other | Admitting: Family Medicine

## 2015-10-20 ENCOUNTER — Ambulatory Visit (INDEPENDENT_AMBULATORY_CARE_PROVIDER_SITE_OTHER): Payer: Medicaid Other | Admitting: Family Medicine

## 2015-10-20 ENCOUNTER — Encounter: Payer: Self-pay | Admitting: Family Medicine

## 2015-10-20 VITALS — BP 112/88 | HR 108 | Temp 98.5°F | Resp 18 | Wt 261.0 lb

## 2015-10-20 DIAGNOSIS — H6693 Otitis media, unspecified, bilateral: Secondary | ICD-10-CM | POA: Diagnosis not present

## 2015-10-20 DIAGNOSIS — J029 Acute pharyngitis, unspecified: Secondary | ICD-10-CM | POA: Diagnosis not present

## 2015-10-20 LAB — POCT RAPID STREP A (OFFICE): RAPID STREP A SCREEN: NEGATIVE

## 2015-10-20 MED ORDER — AZITHROMYCIN 250 MG PO TABS: ORAL_TABLET | ORAL | Status: AC

## 2015-10-20 NOTE — Progress Notes (Signed)
Patient: [name] Female    DOB: 01/20/1986   30 y.o.   MRN: [id number] Visit Date: 10/20/2015  Today's Provider: Mila Merryonald Fisher, MD   Chief Complaint  Patient presents with  . Cough   Subjective:    Cough This is a new problem. Episode onset: 2 days ago. The problem has been gradually worsening. The cough is non-productive. Associated symptoms include chills, ear congestion (right ear), ear pain (ache in left ear), headaches, nasal congestion, postnasal drip, a sore throat (sharp pain) and sweats. Pertinent negatives include no chest pain, eye redness, fever, hemoptysis, myalgias, rhinorrhea, shortness of breath or wheezing. The symptoms are aggravated by cold air and lying down. She has tried OTC cough suppressant (also tried Delsym,  Catering managerAlka Seltzer Cold plus and hot tea) for the symptoms. The treatment provided no relief.  Patient states she gags more than usual.       Allergies  Allergen Reactions  . Lactose Intolerance (Gi) Diarrhea  . Penicillins Hives  . Pineapple Other (See Comments)    Mouth sores   Previous Medications   ALBUTEROL (PROAIR HFA) 108 (90 BASE) MCG/ACT INHALER    Inhale into the lungs.   ESTRADIOL CYPIONATE (DEPO-ESTRADIOL) 5 MG/ML INJECTION    Inject into the muscle every 3 (three) months.   FLUOXETINE (PROZAC) 20 MG TABLET    Take 1 tablet (20 mg total) by mouth daily.   MONTELUKAST (SINGULAIR) 10 MG TABLET    Take 1 tablet (10 mg total) by mouth at bedtime.   PANTOPRAZOLE (PROTONIX) 40 MG TABLET    Take 1 tablet (40 mg total) by mouth daily.    Review of Systems  Constitutional: Positive for chills, diaphoresis and fatigue. Negative for fever and appetite change.  HENT: Positive for congestion, ear pain (ache in left ear), nosebleeds, postnasal drip, sneezing, sore throat (sharp pain) and voice change. Negative for rhinorrhea, sinus pressure and trouble swallowing.   Eyes: Negative for pain, discharge, redness and itching.  Respiratory:  Positive for cough. Negative for hemoptysis, chest tightness, shortness of breath and wheezing.   Cardiovascular: Negative for chest pain and palpitations.  Gastrointestinal: Negative for nausea, vomiting and abdominal pain.  Musculoskeletal: Negative for myalgias.  Neurological: Positive for headaches. Negative for dizziness and weakness.    Social History  Substance Use Topics  . Smoking status: Current Some Day Smoker  . Smokeless tobacco: Never Used     Comment: smokes cigars  . Alcohol Use: 0.0 oz/week    0 Standard drinks or equivalent per week     Comment: ocassional alcohol use; Mixed drinks once every 2-3 months   Objective:   BP 112/88 mmHg  Pulse 108  Temp(Src) 98.5 F (36.9 C) (Oral)  Resp 18  Wt 261 lb (118.389 kg)  SpO2 93%  Physical Exam  General Appearance:    Alert, cooperative, no distress  HENT:   bilateral TM red, dull, bulging, neck without nodes, neck has bilateral anterior cervical nodes enlarged, pharynx erythematous without exudate, sinuses nontender and nasal mucosa congested  Eyes:    PERRL, conjunctiva/corneas clear, EOM's intact       Lungs:     Clear to auscultation bilaterally, respirations unlabored  Heart:    Regular rate and rhythm  Neurologic:   Awake, alert, oriented x 3. No apparent focal neurological           defect.           Assessment & Plan:  1. Sore throat  - POCT rapid strep A  2. Bilateral acute otitis media, recurrence not specified, unspecified otitis media type Zpack. Call if symptoms change or if not rapidly improving.           Mila Merry, MD  [facility]

## 2015-12-09 ENCOUNTER — Ambulatory Visit: Payer: Self-pay | Admitting: Physician Assistant

## 2015-12-18 ENCOUNTER — Encounter: Payer: Self-pay | Admitting: Physician Assistant

## 2015-12-18 ENCOUNTER — Ambulatory Visit (INDEPENDENT_AMBULATORY_CARE_PROVIDER_SITE_OTHER): Payer: Medicaid Other | Admitting: Physician Assistant

## 2015-12-18 VITALS — BP 126/80 | HR 76 | Temp 98.7°F | Wt 267.0 lb

## 2015-12-18 DIAGNOSIS — Z309 Encounter for contraceptive management, unspecified: Secondary | ICD-10-CM

## 2015-12-18 DIAGNOSIS — Z3009 Encounter for other general counseling and advice on contraception: Secondary | ICD-10-CM

## 2015-12-18 DIAGNOSIS — Z3042 Encounter for surveillance of injectable contraceptive: Secondary | ICD-10-CM

## 2015-12-18 MED ORDER — MEDROXYPROGESTERONE ACETATE 150 MG/ML IM SUSP: 150.0000 mg | INTRAMUSCULAR | 4 refills | Status: DC

## 2015-12-18 NOTE — Progress Notes (Signed)
Patient: [name] Female    DOB: June 11, 1985   30 y.o.   MRN: [id number] Visit Date: 12/18/2015  Today's Provider: Margaretann Loveless, [name]   Chief Complaint  Patient presents with  . Contraception    Needs a Depo Shot.   Subjective:    HPI  Pt is coming in today for her Depo Provera shot.  She reports she used to get it at [facility] but would like to transfer here.  She states her last shot was May 25th-26th. She has been tolerating it well. She started depo-provera as contraception in 01/2015.   She just had her CPE w/ pap in 08/2015. She has had an abnormal pap before and has had 2 normals. She will be due for CPE 08/2016 with pap. If normal, she can then go onto 3 year screens.     Allergies  Allergen Reactions  . Lactose Intolerance (Gi) Diarrhea  . Penicillins Hives  . Pineapple Other (See Comments)    Mouth sores   Current Meds  Medication Sig  . albuterol (PROAIR HFA) 108 (90 BASE) MCG/ACT inhaler Inhale into the lungs.  Marland Kitchen estradiol cypionate (DEPO-ESTRADIOL) 5 MG/ML injection Inject into the muscle every 3 (three) months.  Marland Kitchen FLUoxetine (PROZAC) 20 MG tablet Take 1 tablet (20 mg total) by mouth daily.  . montelukast (SINGULAIR) 10 MG tablet Take 1 tablet (10 mg total) by mouth at bedtime.  . pantoprazole (PROTONIX) 40 MG tablet Take 1 tablet (40 mg total) by mouth daily.    Review of Systems  Constitutional: Negative.   Respiratory: Negative.   Cardiovascular: Negative.   Gastrointestinal: Negative.   Genitourinary: Negative.     Social History  Substance Use Topics  . Smoking status: Current Some Day Smoker  . Smokeless tobacco: Never Used     Comment: smokes cigars  . Alcohol use 0.0 oz/week     Comment: ocassional alcohol use; Mixed drinks once every 2-3 months   Objective:   BP 126/80 (BP Location: Left Wrist, Patient Position: Sitting, Cuff Size: Normal)   Pulse 76   Temp 98.7 F (37.1 C) (Oral)   Wt 267 lb (121.1 kg)   BMI  52.14 kg/m   Physical Exam  Constitutional: She appears well-developed and well-nourished. No distress.  Cardiovascular: Normal rate, regular rhythm and normal heart sounds.  Exam reveals no gallop and no friction rub.   No murmur heard. Pulmonary/Chest: Effort normal and breath sounds normal. No respiratory distress. She has no wheezes. She has no rales.  Skin: She is not diaphoretic.  Vitals reviewed.     Assessment & Plan:     1. Birth control counseling Depo-provera sent to pharmacy for her to pick up and bring with her on dates injections are due. Next is due between 8/10-24/2017. She will schedule to return during that time for depo injection only. I will see her back in 08/2016 for her CPE w/ pap. She is to call the office if she has any issues, questions or concerns in the meantime. - medroxyPROGESTERone (DEPO-PROVERA) 150 MG/ML injection; Inject 1 mL (150 mg total) into the muscle every 3 (three) months.  Dispense: 1 mL; Refill: 4  2. Surveillance for Depo-Provera contraception See above medical treatment plan. - medroxyPROGESTERone (DEPO-PROVERA) 150 MG/ML injection; Inject 1 mL (150 mg total) into the muscle every 3 (three) months.  Dispense: 1 mL; Refill: 4       Margaretann Loveless, [name]  [facility]

## 2015-12-18 NOTE — Patient Instructions (Signed)

## 2016-01-04 ENCOUNTER — Ambulatory Visit (INDEPENDENT_AMBULATORY_CARE_PROVIDER_SITE_OTHER): Payer: Medicaid Other | Admitting: Physician Assistant

## 2016-01-04 DIAGNOSIS — Z3042 Encounter for surveillance of injectable contraceptive: Secondary | ICD-10-CM

## 2016-01-04 MED ORDER — MEDROXYPROGESTERONE ACETATE 150 MG/ML IM SUSP
150.0000 mg | Freq: Once | INTRAMUSCULAR | Status: AC
  Administered 2016-01-04: 150 mg via INTRAMUSCULAR

## 2016-01-04 NOTE — Progress Notes (Signed)
Nurse Visit for Depo-Provera. Patient tolerated well. Observed for 10 minutes. Patient is to return for next injection Oct 30-November 13.

## 2016-01-10 ENCOUNTER — Emergency Department
Admission: EM | Admit: 2016-01-10 | Discharge: 2016-01-10 | Disposition: A | Discharge status: Home / Self Care | Payer: Medicaid Other | Attending: Emergency Medicine | Admitting: Emergency Medicine

## 2016-01-10 ENCOUNTER — Emergency Department: Payer: Medicaid Other

## 2016-01-10 ENCOUNTER — Encounter: Payer: Self-pay | Admitting: Emergency Medicine

## 2016-01-10 DIAGNOSIS — F17200 Nicotine dependence, unspecified, uncomplicated: Secondary | ICD-10-CM | POA: Diagnosis not present

## 2016-01-10 DIAGNOSIS — M25511 Pain in right shoulder: Secondary | ICD-10-CM | POA: Diagnosis not present

## 2016-01-10 DIAGNOSIS — R0789 Other chest pain: Secondary | ICD-10-CM | POA: Diagnosis not present

## 2016-01-10 DIAGNOSIS — R079 Chest pain, unspecified: Secondary | ICD-10-CM

## 2016-01-10 DIAGNOSIS — Z7951 Long term (current) use of inhaled steroids: Secondary | ICD-10-CM | POA: Diagnosis not present

## 2016-01-10 DIAGNOSIS — Z79899 Other long term (current) drug therapy: Secondary | ICD-10-CM | POA: Diagnosis not present

## 2016-01-10 LAB — BASIC METABOLIC PANEL
ANION GAP: 3 — AB (ref 5–15)
BUN: 12 mg/dL (ref 6–20)
CALCIUM: 8.9 mg/dL (ref 8.9–10.3)
CO2: 26 mmol/L (ref 22–32)
CREATININE: 0.69 mg/dL (ref 0.44–1.00)
Chloride: 111 mmol/L (ref 101–111)
GFR calc Af Amer: >60 mL/min (ref >60)
GFR calc non Af Amer: >60 mL/min (ref >60)
GLUCOSE: 126 mg/dL — AB (ref 65–99)
Potassium: 3.8 mmol/L (ref 3.5–5.1)
Sodium: 140 mmol/L (ref 135–145)

## 2016-01-10 LAB — CBC
HCT: 35.4 % (ref 35.0–47.0)
HEMOGLOBIN: 12.3 g/dL (ref 12.0–16.0)
MCH: 32.1 pg (ref 26.0–34.0)
MCHC: 34.8 g/dL (ref 32.0–36.0)
MCV: 92.2 fL (ref 80.0–100.0)
Platelets: 206 10*3/uL (ref 150–440)
RBC: 3.85 MIL/uL (ref 3.80–5.20)
RDW: 12.9 % (ref 11.5–14.5)
WBC: 5.6 10*3/uL (ref 3.6–11.0)

## 2016-01-10 LAB — URINALYSIS COMPLETE WITH MICROSCOPIC (ARMC ONLY)
Bacteria, UA: NONE SEEN
Bilirubin Urine: NEGATIVE
GLUCOSE, UA: NEGATIVE mg/dL
Hgb urine dipstick: NEGATIVE
Ketones, ur: NEGATIVE mg/dL
Leukocytes, UA: NEGATIVE
Nitrite: NEGATIVE
PROTEIN: NEGATIVE mg/dL
SPECIFIC GRAVITY, URINE: 1.025 (ref 1.005–1.030)
pH: 7 (ref 5.0–8.0)

## 2016-01-10 LAB — TROPONIN I

## 2016-01-10 LAB — POCT PREGNANCY, URINE: Preg Test, Ur: NEGATIVE

## 2016-01-10 NOTE — Discharge Instructions (Signed)
You have been seen in the emergency department today for chest pain. Your workup has shown normal results. As we discussed please follow-up with your primary care physician in the next 1-2 days for recheck. Return to the emergency department for any further chest pain, trouble breathing, or any other symptom personally concerning to yourself. °

## 2016-01-10 NOTE — ED Notes (Signed)
Patient transported to X-ray 

## 2016-01-10 NOTE — ED Triage Notes (Signed)
States shoulder pain x 1 week. Denies injury. Weakness x 3 days.

## 2016-01-10 NOTE — ED Provider Notes (Signed)
[facility] Emergency Department Provider Note  Time seen: 7:04 PM  I have reviewed the triage vital signs and the nursing notes.   HISTORY  Chief Complaint Weakness and Shoulder Pain    HPI [name] is a 30 y.o. female with a past medical history of anxiety, depression, presents the emergency department with chest discomfort and right shoulder discomfort. According to the patient for the past one week she has been experiencing intermittent right shoulder pain, worse with movement. She is also experiencing what she describes as a constant chest tightness especially in the right side of her chest. Denies any trouble breathing. States she does feel dizzy at times. States during her pregnancy in 2010 she was diagnosed with congestive heart failure diabetes and hypertension/preeclampsia. She states since the pregnancy all of these have resolved and she is not on treatment for any of them. She states the chest tightness feels similar to that time. Denies any leg pain or swelling. States mild chest tightness currently.  Past Medical History:  Diagnosis Date  . Anxiety   . Depression   . Elevated blood pressure   . GERD (gastroesophageal reflux disease)   . Sleep apnea     Patient Active Problem List   Diagnosis Date Noted  . Acute frontal sinusitis 05/14/2015  . Pre-eclampsia, delivered 02/12/2015  . Abnormal vaginal Pap smear 02/05/2015  . Abortion, spontaneous 02/05/2015  . Anxiety 02/05/2015  . LBP (low back pain) 02/05/2015  . Body mass index of 60 or higher (HCC) 02/05/2015  . Fatigue 02/05/2015  . Fibrositis 02/05/2015  . Folliculitis 02/05/2015  . Acid reflux 02/05/2015  . Diabetes mellitus arising in pregnancy 02/05/2015  . High risk sexual behavior 02/05/2015  . 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/2015  . Obesity 02/05/2015  . Obstructive sleep apnea 02/05/2015  . Vitamin D deficiency 02/05/2015  . Depression 02/05/2015  . Fibromyalgia 02/05/2015  .  Abnormal C-reactive protein 10/24/2012  . Chronic LBP 08/13/2012  . Absence of menstruation 09/22/2009  . Migraine without status migrainosus 07/22/2008    Past Surgical History:  Procedure Laterality Date  . CESAREAN SECTION     at 28 weeks for pre-eclampsia, pulmonary edema, gestational DM.  Marland Kitchen. dilationand curettage of Uterus  2007   SAB  . Headache:2009     Headache wellness consult. during pregnancy    Prior to Admission medications   Medication Sig Start Date End Date Taking? Authorizing Provider  albuterol (PROAIR HFA) 108 (90 BASE) MCG/ACT inhaler Inhale into the lungs.    Historical Provider, MD  FLUoxetine (PROZAC) 20 MG tablet Take 1 tablet (20 mg total) by mouth daily. 09/10/15   Lorie PhenixNancy Maloney, MD  medroxyPROGESTERone (DEPO-PROVERA) 150 MG/ML injection Inject 1 mL (150 mg total) into the muscle every 3 (three) months. 12/18/15   Margaretann LovelessJennifer M Burnette, [name]  montelukast (SINGULAIR) 10 MG tablet Take 1 tablet (10 mg total) by mouth at bedtime. 09/10/15   Lorie PhenixNancy Maloney, MD  pantoprazole (PROTONIX) 40 MG tablet Take 1 tablet (40 mg total) by mouth daily. 02/05/15   Margaretann LovelessJennifer M Burnette, [name]    Allergies  Allergen Reactions  . Lactose Intolerance (Gi) Diarrhea  . Penicillins Hives  . Pineapple Other (See Comments)    Mouth sores    Family History  Problem Relation Age of Onset  . Hypertension Maternal Grandmother     Social History Social History  Substance Use Topics  . Smoking status: Current Some Day Smoker  . Smokeless tobacco: Never Used  Comment: smokes cigars  . Alcohol use 0.0 oz/week     Comment: ocassional alcohol use; Mixed drinks once every 2-3 months    Review of Systems Constitutional: Negative for fever. Cardiovascular: Chest tightness. Respiratory: Negative for shortness of breath. Gastrointestinal: Negative for abdominal pain, vomiting and diarrhea Musculoskeletal: Negative for back pain. Positive for right shoulder pain. Neurological: Negative  for headache 10-point ROS otherwise negative.  ____________________________________________   PHYSICAL EXAM:  VITAL SIGNS: ED Triage Vitals  Enc Vitals Group     BP 01/10/16 1829 (!) 149/77     Pulse Rate 01/10/16 1828 87     Resp 01/10/16 1828 18     Temp 01/10/16 1828 98.2 F (36.8 C)     Temp src --      SpO2 01/10/16 1828 99 %     Weight 01/10/16 1829 258 lb (117 kg)     Height 01/10/16 1829 5' (1.524 m)     Head Circumference --      Peak Flow --      Pain Score 01/10/16 1829 8     Pain Loc --      Pain Edu? --      Excl. in GC? --     Constitutional: Alert and oriented. Well appearing and in no distress. Eyes: Normal exam ENT   Head: Normocephalic and atraumatic   Mouth/Throat: Mucous membranes are moist. Cardiovascular: Normal rate, regular rhythm. No murmur Respiratory: Normal respiratory effort without tachypnea nor retractions. Breath sounds are clear Gastrointestinal: Soft and nontender. No distention.  Musculoskeletal: Mild right shoulder tenderness palpation with moderate pain elicited with range of motion. Good range of motion, neurovascularly intact. No concern for dislocation or fracture. Neurologic:  Normal speech and language. No gross focal neurologic deficits  Skin:  Skin is warm, dry and intact.  Psychiatric: Mood and affect are normal. Speech and behavior are normal.   ____________________________________________    EKG  EKG reviewed and interpreted by myself shows normal sinus rhythm at 94 bpm, narrow QRS, normal axis, normal intervals, no ST changes, overall reassuring EKG.  ____________________________________________    RADIOLOGY  Negative chest x-ray  ____________________________________________   INITIAL IMPRESSION / ASSESSMENT AND PLAN / ED COURSE  Pertinent labs & imaging results that were available during my care of the patient were reviewed by me and considered in my medical decision making (see chart for  details).  The patient presents the emergency department with chest tightness or right shoulder pain ongoing for the past one week. She also states intermittent dizziness, she feels like this could be related to high blood pressure. We will check labs, EKG, chest x-ray. Overall the patient appears well, no distress.  X-ray negative. EKG reassuring. Labs are normal. We'll discharge the patient a PCP follow-up. Cardiology follow-up as needed.  ____________________________________________   FINAL CLINICAL IMPRESSION(S) / ED DIAGNOSES  Chest pain Musculoskeletal pain    Minna AntisKevin Kala Gassmann, MD 01/10/16 2053

## 2016-01-11 ENCOUNTER — Ambulatory Visit (INDEPENDENT_AMBULATORY_CARE_PROVIDER_SITE_OTHER): Payer: Medicaid Other | Admitting: Family Medicine

## 2016-01-11 ENCOUNTER — Telehealth: Payer: Self-pay | Admitting: Family Medicine

## 2016-01-11 ENCOUNTER — Encounter: Payer: Self-pay | Admitting: Family Medicine

## 2016-01-11 VITALS — BP 112/76 | HR 92 | Temp 98.7°F | Resp 16 | Wt 267.0 lb

## 2016-01-11 DIAGNOSIS — S29011D Strain of muscle and tendon of front wall of thorax, subsequent encounter: Secondary | ICD-10-CM

## 2016-01-11 DIAGNOSIS — R110 Nausea: Secondary | ICD-10-CM

## 2016-01-11 LAB — POCT GLYCOSYLATED HEMOGLOBIN (HGB A1C)
ESTIMATED AVERAGE GLUCOSE: 105
HEMOGLOBIN A1C: 5.3

## 2016-01-11 NOTE — Telephone Encounter (Signed)
[facility] for [facility] pain.  I have schedule a hospital follow up appointment/MW

## 2016-01-11 NOTE — Progress Notes (Signed)
Subjective:     Patient ID: [name], female   DOB: 05/19/1986, 30 y.o.   MRN: [id number]  HPI  Chief Complaint  Patient presents with  . Follow-up    ED FU from 01/10/2016 for weakness and shoulder pain. EKG, CXR and labs were normal. Diagnosed with chest pain and musculoskeletal pain. Pt reports the pain is unchanged. Has cardiologist appointment Friday.  States she also has had persistent nausea for the last two weeks with negative urine HCG. Concerned about diabetes as she is thirsty a lot with blurred vision and dizziness at times. Occasional use of ETOH; denies unusual stress in her life.Last moved her bowels today.   Review of Systems     Objective:   Physical Exam  Constitutional: She appears well-developed and well-nourished. No distress.  Pulmonary/Chest: Breath sounds normal. She has no wheezes.  Abdominal: Soft. Tenderness: mild in epigastric area  There is no guarding.       Assessment:    1. Chest wall muscle strain, subsequent encounter  2. Nause - Hepatic function panel - US Abdomen Complete; Future - Lipase - POCT glycosylated hemoglobin (Hb A1C)    Plan:    Further f/u pending lab work. May use Aleve for her chest discomfort if labs ok.

## 2016-01-11 NOTE — Telephone Encounter (Signed)
Appointment is today. Allene DillonEmily Drozdowski, CMA

## 2016-01-11 NOTE — Patient Instructions (Addendum)
We will call you with the lab test results and scheduling of the ultrasound. If labs ok, use two Aleve twice daily with food for chest discomfort.

## 2016-01-12 ENCOUNTER — Other Ambulatory Visit: Payer: Self-pay | Admitting: Family Medicine

## 2016-01-12 ENCOUNTER — Telehealth: Payer: Self-pay

## 2016-01-12 LAB — HEPATIC FUNCTION PANEL
ALBUMIN: 4.1 g/dL (ref 3.5–5.5)
ALT: 11 IU/L (ref 0–32)
AST: 17 IU/L (ref 0–40)
Alkaline Phosphatase: 80 IU/L (ref 39–117)
BILIRUBIN TOTAL: 0.2 mg/dL (ref 0.0–1.2)
Bilirubin, Direct: 0.08 mg/dL (ref 0.00–0.40)
Total Protein: 6.8 g/dL (ref 6.0–8.5)

## 2016-01-12 LAB — LIPASE: Lipase: 24 U/L (ref 0–59)

## 2016-01-12 NOTE — Telephone Encounter (Signed)
-----   Message from Anola Gurneyobert Chauvin, [place] sent at 01/12/2016  7:43 AM EDT ----- Liver and pancreas are ok. Let's proceed with the abdominal ultrasound.

## 2016-01-12 NOTE — Telephone Encounter (Signed)
Patient was advised,please proceed with order for ultrasound. KW

## 2016-01-15 ENCOUNTER — Other Ambulatory Visit: Payer: Self-pay | Admitting: Family Medicine

## 2016-01-15 ENCOUNTER — Ambulatory Visit
Admission: RE | Admit: 2016-01-15 | Discharge: 2016-01-15 | Disposition: A | Discharge status: Home / Self Care | Payer: Medicaid Other | Source: Ambulatory Visit | Attending: Family Medicine | Admitting: Family Medicine

## 2016-01-15 ENCOUNTER — Telehealth: Payer: Self-pay

## 2016-01-15 DIAGNOSIS — R110 Nausea: Secondary | ICD-10-CM

## 2016-01-15 MED ORDER — ONDANSETRON HCL 4 MG PO TABS: 4.0000 mg | ORAL_TABLET | Freq: Three times a day (TID) | ORAL | 0 refills | Status: DC | PRN

## 2016-01-15 NOTE — Telephone Encounter (Signed)
Patient was advised, she states that nausea has not improved and she is still having pain in chest and shoulders. Patient saw cardiologist today who ordered CT of brain on Monday and Stress test has been ordered Tuesday. Patient states that she will be returning back to cardiology 01/26/2016  Pharmacy- [facility] [place]

## 2016-01-15 NOTE — Telephone Encounter (Signed)
I have sent in nausea medication.

## 2016-01-15 NOTE — Telephone Encounter (Signed)
Patient advised as below.  

## 2016-01-15 NOTE — Telephone Encounter (Signed)
-----   Message from Anola Gurneyobert Chauvin, [place] sent at 01/15/2016 10:46 AM EDT ----- Ultrasound ok. Has nausea gotten better?

## 2016-03-22 ENCOUNTER — Ambulatory Visit (INDEPENDENT_AMBULATORY_CARE_PROVIDER_SITE_OTHER): Payer: Medicaid Other | Admitting: Physician Assistant

## 2016-03-22 DIAGNOSIS — Z3042 Encounter for surveillance of injectable contraceptive: Secondary | ICD-10-CM | POA: Diagnosis not present

## 2016-03-22 MED ORDER — MEDROXYPROGESTERONE ACETATE 150 MG/ML IM SUSP
150.0000 mg | Freq: Once | INTRAMUSCULAR | Status: AC
  Administered 2016-03-22: 150 mg via INTRAMUSCULAR

## 2016-03-22 NOTE — Progress Notes (Signed)
Depo provera 150mg  IM injection given today. Patient tolerated well. She is to return in 3 months for next injection.

## 2016-04-07 ENCOUNTER — Telehealth: Payer: Self-pay | Admitting: Physician Assistant

## 2016-04-07 NOTE — Telephone Encounter (Signed)
Pt was discharged today from [facility] office for chest pain.  I have scheduled a follow up/MW

## 2016-04-08 NOTE — Telephone Encounter (Signed)
She got scheduled for Monday Nov.20th.  Thanks,  -Joy Haegele

## 2016-04-11 ENCOUNTER — Encounter: Payer: Self-pay | Admitting: Physician Assistant

## 2016-04-11 ENCOUNTER — Ambulatory Visit (INDEPENDENT_AMBULATORY_CARE_PROVIDER_SITE_OTHER): Payer: Medicaid Other | Admitting: Physician Assistant

## 2016-04-11 VITALS — BP 130/80 | HR 103 | Temp 98.5°F | Resp 16 | Wt 271.4 lb

## 2016-04-11 DIAGNOSIS — B372 Candidiasis of skin and nail: Secondary | ICD-10-CM

## 2016-04-11 DIAGNOSIS — R079 Chest pain, unspecified: Secondary | ICD-10-CM | POA: Diagnosis not present

## 2016-04-11 DIAGNOSIS — I498 Other specified cardiac arrhythmias: Secondary | ICD-10-CM

## 2016-04-11 DIAGNOSIS — Z113 Encounter for screening for infections with a predominantly sexual mode of transmission: Secondary | ICD-10-CM

## 2016-04-11 MED ORDER — NYSTATIN 100000 UNIT/GM EX CREA: 1.0000 "application " | TOPICAL_CREAM | Freq: Two times a day (BID) | CUTANEOUS | 0 refills | Status: DC

## 2016-04-11 NOTE — Progress Notes (Signed)
Patient: [name] Female    DOB: 04/13/1986   30 y.o.   MRN: [id number] Visit Date: 04/11/2016  Today's Provider: Margaretann LovelessJennifer M Russell Quinney, [name]   No chief complaint on file.  Subjective:    HPI   Follow Up ER Visit  Patient is here for ER follow up.  She was recently seen at [facility] for chest pain, unspecified type and palpitations on 04/07/16. She reports good compliance with treatment. She reports this condition is Improved. Took couple of days to go away. GI cocktail given "numbed her" but did not take pain completely away. Was told she had a "leaky valve" at Dr. Santo HeldKahn's office. She does not prefer to see him again.  She also wants to get tested for STD and she has a rash on lower abdomen/pelvic are that comes and goes and itches. ------------------------------------------------------------------------------------    Allergies  Allergen Reactions  . Lactose Intolerance (Gi) Diarrhea  . Penicillins Hives    Has patient had a PCN reaction causing immediate rash, facial/tongue/throat swelling, SOB or lightheadedness with hypotension: Yes Has patient had a PCN reaction causing severe rash involving mucus membranes or skin necrosis: No Has patient had a PCN reaction that required hospitalization No Has patient had a PCN reaction occurring within the last 10 years: Yes If all of the above answers are "NO", then may proceed with Cephalosporin use.  Marland Kitchen. Pineapple Other (See Comments)    Patient states this cuts my mouth and makes it bleed.     Current Outpatient Prescriptions:  .  albuterol (PROAIR HFA) 108 (90 BASE) MCG/ACT inhaler, Inhale 1-2 puffs into the lungs every 6 (six) hours as needed for wheezing or shortness of breath. , Disp: , Rfl:  .  montelukast (SINGULAIR) 10 MG tablet, Take 1 tablet (10 mg total) by mouth at bedtime. (Patient not taking: Reported on 01/11/2016), Disp: 30 tablet, Rfl: 3 .  ondansetron (ZOFRAN) 4 MG tablet,  Take 1 tablet (4 mg total) by mouth every 8 (eight) hours as needed for nausea or vomiting., Disp: 20 tablet, Rfl: 0 .  pantoprazole (PROTONIX) 40 MG tablet, Take 1 tablet (40 mg total) by mouth daily., Disp: 30 tablet, Rfl: 3  Review of Systems  Constitutional: Negative.   Respiratory: Negative for cough, chest tightness, shortness of breath and wheezing.   Cardiovascular: Negative for chest pain, palpitations and leg swelling.  Gastrointestinal: Negative for abdominal pain and nausea.  Genitourinary: Negative for dyspareunia, dysuria, flank pain, frequency, genital sores, hematuria, menstrual problem, vaginal bleeding, vaginal discharge and vaginal pain.  Skin: Positive for rash.  Neurological: Negative for dizziness, light-headedness and headaches.    Social History  Substance Use Topics  . Smoking status: Current Some Day Smoker  . Smokeless tobacco: Never Used     Comment: smokes cigars  . Alcohol use 0.0 oz/week     Comment: ocassional alcohol use; Mixed drinks once every 2-3 months   Objective:   BP 130/80 (BP Location: Right Wrist, Patient Position: Sitting, Cuff Size: Normal)   Pulse (!) 103   Temp 98.5 F (36.9 C) (Oral)   Resp 16   Wt 271 lb 6.4 oz (123.1 kg)   BMI 53.00 kg/m   Physical Exam  Constitutional: She is oriented to person, place, and time. She appears well-developed and well-nourished. No distress.  Cardiovascular: Normal rate, regular rhythm and normal heart sounds.  Exam reveals no gallop and no friction rub.   No murmur heard.  Pulmonary/Chest: Effort normal and breath sounds normal. No respiratory distress. She has no wheezes. She has no rales.  Abdominal: Soft. Normal appearance and bowel sounds are normal. She exhibits no distension and no mass. There is no hepatosplenomegaly. There is no tenderness. There is no rebound, no guarding and no CVA tenderness. Hernia confirmed negative in the right inguinal area and confirmed negative in the left inguinal  area.  Genitourinary: No labial fusion. There is no rash, tenderness, lesion or injury on the right labia. There is no rash, tenderness, lesion or injury on the left labia. No erythema, tenderness or bleeding in the vagina. No foreign body in the vagina. No signs of injury around the vagina. No vaginal discharge found.  Lymphadenopathy:       Right: No inguinal adenopathy present.       Left: No inguinal adenopathy present.  Neurological: She is alert and oriented to person, place, and time.  Skin: Skin is warm and dry. Rash noted. Rash is macular. She is not diaphoretic.     Vitals reviewed.      Assessment & Plan:     1. Fluttering heart Patient has been having increasing heart fluttering sensation with chest pressure and pain during these episodes. No correlation with stressors when attacks happen but possible underlying anxiety. Patient has previously been seen by Dr. Park BreedKahn and underwent multiple workups. She reports that he told her at one point that she had a leak eval and also possibly some other "spot" on her heart but never went into detail and never offered any treatment options. Patient would like to be referred to another cardiologist that excepts her insurance and is willing to [place] [facility] [place] if [place]. She does not prefer to see Dr. Park BreedKahn again. She is to call the office if symptoms worsen in the meantime. - Ambulatory referral to Cardiology  2. Chest pain, unspecified type See above medical treatment plan. - Ambulatory referral to Cardiology  3. Screen for STD (sexually transmitted disease) Patient is sexually active and does have a rash on her lower abdomen which seems most consistent with yeast. She is requesting STD testing. STD testing was collected as below. I will follow-up with her pending these results. - NuSwab Vaginitis Plus (VG+) - HIV antibody (with reflex) - RPR - HSV(herpes simplex vrs) 1+2 ab-IgM  4. Skin yeast infection Yeast infection  noted under lower abdominal pannus. Discussed this most likely and moisture issue. Advised the patient to try to keep skin dry, may use powder in that area if necessary. Nystatin cream given as below. She is to call if it does not respond to treatment or if symptoms worsen. - nystatin cream (MYCOSTATIN); Apply 1 application topically 2 (two) times daily.  Dispense: 30 g; Refill: 0       Margaretann LovelessJennifer M Clessie Karras, [name]  [facility]

## 2016-04-12 ENCOUNTER — Telehealth: Payer: Self-pay | Admitting: Physician Assistant

## 2016-04-12 NOTE — Telephone Encounter (Signed)
Pt called for lab results.  She was in yesterday and seen [name].  Her call back is [phone]  Thanks Barth Kirksteri

## 2016-04-12 NOTE — Telephone Encounter (Signed)
Advised patient that reports are not back yet from lab and will contact her when they do come in. [place]

## 2016-04-13 ENCOUNTER — Telehealth: Payer: Self-pay | Admitting: Physician Assistant

## 2016-04-13 LAB — HSV(HERPES SIMPLEX VRS) I + II AB-IGM: HSVI/II Comb IgM: <0.91 Ratio (ref 0.00–0.90)

## 2016-04-13 LAB — RPR: RPR Ser Ql: NONREACTIVE

## 2016-04-13 LAB — HIV ANTIBODY (ROUTINE TESTING W REFLEX): HIV Screen 4th Generation wRfx: NONREACTIVE

## 2016-04-13 NOTE — Telephone Encounter (Signed)
I dont see all the reports back I see some of the labs have came back or at least the preliminary .Please review and advise. KW

## 2016-04-13 NOTE — Telephone Encounter (Signed)
Not all results are back yet, but HIV, syphilis, herpes, GC/chlamydia, and trich are all negative. Only awaiting BV and yeast results.

## 2016-04-13 NOTE — Telephone Encounter (Signed)
Patient was advised. KW 

## 2016-04-13 NOTE — Telephone Encounter (Signed)
Pt called to see if any of her lab results had come in from 04/11/16. Please advise. Thanks TNP

## 2016-04-14 LAB — NUSWAB VAGINITIS PLUS (VG+)
CANDIDA ALBICANS, NAA: NEGATIVE
CANDIDA GLABRATA, NAA: NEGATIVE
CHLAMYDIA TRACHOMATIS, NAA: NEGATIVE
NEISSERIA GONORRHOEAE, NAA: NEGATIVE
Trich vag by NAA: NEGATIVE

## 2016-04-15 ENCOUNTER — Telehealth: Payer: Self-pay

## 2016-04-15 NOTE — Telephone Encounter (Signed)
-----   Message from Margaretann LovelessJennifer M Burnette, [name] sent at 04/15/2016  8:00 AM EST ----- NuSwab remained negative for yeast and BV as well.

## 2016-04-15 NOTE — Telephone Encounter (Signed)
Patient was advised. KW 

## 2016-04-25 ENCOUNTER — Ambulatory Visit (INDEPENDENT_AMBULATORY_CARE_PROVIDER_SITE_OTHER): Payer: Medicaid Other | Admitting: Physician Assistant

## 2016-04-25 ENCOUNTER — Encounter: Payer: Self-pay | Admitting: Physician Assistant

## 2016-04-25 VITALS — BP 116/82 | HR 84 | Temp 98.7°F | Resp 16 | Wt 274.0 lb

## 2016-04-25 DIAGNOSIS — K529 Noninfective gastroenteritis and colitis, unspecified: Secondary | ICD-10-CM

## 2016-04-25 MED ORDER — ONDANSETRON HCL 4 MG PO TABS: 4.0000 mg | ORAL_TABLET | Freq: Three times a day (TID) | ORAL | 0 refills | Status: DC | PRN

## 2016-04-25 NOTE — Patient Instructions (Signed)
Diarrhea, Adult °Introduction °Diarrhea is when you have loose and water poop (stool) often. Diarrhea can make you feel weak and cause you to get dehydrated. Dehydration can make you tired and thirsty, make you have a dry mouth, and make it so you pee (urinate) less often. Diarrhea often lasts 2-3 days. However, it can last longer if it is a sign of something more serious. It is important to treat your diarrhea as told by your doctor. °Follow these instructions at home: °Eating and drinking °Follow these recommendations as told by your doctor: °· Take an oral rehydration solution (ORS). This is a drink that is sold at pharmacies and stores. °· Drink clear fluids, such as: °¨ Water. °¨ Ice chips. °¨ Diluted fruit juice. °¨ Low-calorie sports drinks. °· Eat bland, easy-to-digest foods in small amounts as you are able. These foods include: °¨ Bananas. °¨ Applesauce. °¨ Rice. °¨ Low-fat (lean) meats. °¨ Toast. °¨ Crackers. °· Avoid drinking fluids that have a lot of sugar or caffeine in them. °· Avoid alcohol. °· Avoid spicy or fatty foods. °General instructions °· Drink enough fluid to keep your pee (urine) clear or pale yellow. °· Wash your hands often. If you cannot use soap and water, use hand sanitizer. °· Make sure that all people in your home wash their hands well and often. °· Take over-the-counter and prescription medicines only as told by your doctor. °· Rest at home while you get better. °· Watch your condition for any changes. °· Take a warm bath to help with any burning or pain from having diarrhea. °· Keep all follow-up visits as told by your doctor. This is important. °Contact a doctor if: °· You have a fever. °· Your diarrhea gets worse. °· You have new symptoms. °· You cannot keep fluids down. °· You feel light-headed or dizzy. °· You have a headache. °· You have muscle cramps. °Get help right away if: °· You have chest pain. °· You feel very weak or you pass out (faint). °· You have bloody or black  poop or poop that look like tar. °· You have very bad pain, cramping, or bloating in your belly (abdomen). °· You have trouble breathing or you are breathing very quickly. °· Your heart is beating very quickly. °· Your skin feels cold and clammy. °· You feel confused. °· You have signs of dehydration, such as: °¨ Dark pee, hardly any pee, or no pee. °¨ Cracked lips. °¨ Dry mouth. °¨ Sunken eyes. °¨ Sleepiness. °¨ Weakness. °This information is not intended to replace advice given to you by your health care provider. Make sure you discuss any questions you have with your health care provider. °Document Released: 10/26/2007 Document Revised: 11/27/2015 Document Reviewed: 01/13/2015 °© 2017 Elsevier ° °

## 2016-04-25 NOTE — Progress Notes (Signed)
Patient: [name] Female    DOB: 06/23/1985   30 y.o.   MRN: [id number] Visit Date: 04/25/2016  Today's Provider: Trey SailorsAdriana M Pollak, [name]   Chief Complaint  Patient presents with  . Diarrhea    Started Saturday   Subjective:    Diarrhea   This is a new problem. The current episode started in the past 7 days. The problem occurs 5 to 10 times per day. The problem has been gradually worsening. The stool consistency is described as watery. The patient states that diarrhea does not awaken her from sleep. Associated symptoms include abdominal pain and chills. Pertinent negatives include no fever, headaches or vomiting. She has tried nothing for the symptoms. Her past medical history is significant for irritable bowel syndrome.   Patient is a 30 y/o female presenting with nausea and diarrhea since Saturday morning. She reports she was eating at a hibachi restaurant on Friday night and Saturday morning she woke up with watery diarrhea up to seven times per day. She reports her bowel movements are watery, became formed on Sunday, and are now back to watery. Endorses some stomach cramping and nausea without vomiting. Denies fever, chills. Denies blood in stool. Denies problems urinating, though stating her urine is dark. Has been avoiding eating and drinking because it makes her go to the bathroom. No history of bowel issues.    Allergies  Allergen Reactions  . Lactose Intolerance (Gi) Diarrhea  . Penicillins Hives    Has patient had a PCN reaction causing immediate rash, facial/tongue/throat swelling, SOB or lightheadedness with hypotension: Yes Has patient had a PCN reaction causing severe rash involving mucus membranes or skin necrosis: No Has patient had a PCN reaction that required hospitalization No Has patient had a PCN reaction occurring within the last 10 years: Yes If all of the above answers are "NO", then may proceed with Cephalosporin use.  Marland Kitchen. Pineapple Other (See  Comments)    Patient states this cuts my mouth and makes it bleed.     Current Outpatient Prescriptions:  .  albuterol (PROAIR HFA) 108 (90 BASE) MCG/ACT inhaler, Inhale 1-2 puffs into the lungs every 6 (six) hours as needed for wheezing or shortness of breath. , Disp: , Rfl:  .  montelukast (SINGULAIR) 10 MG tablet, Take 1 tablet (10 mg total) by mouth at bedtime., Disp: 30 tablet, Rfl: 3 .  nystatin cream (MYCOSTATIN), Apply 1 application topically 2 (two) times daily., Disp: 30 g, Rfl: 0 .  pantoprazole (PROTONIX) 40 MG tablet, Take 1 tablet (40 mg total) by mouth daily., Disp: 30 tablet, Rfl: 3 .  ondansetron (ZOFRAN) 4 MG tablet, Take 1 tablet (4 mg total) by mouth every 8 (eight) hours as needed for nausea or vomiting., Disp: 20 tablet, Rfl: 0  Review of Systems  Constitutional: Positive for appetite change (Decreased Appetite), chills and diaphoresis. Negative for activity change, fatigue, fever and unexpected weight change.  Gastrointestinal: Positive for abdominal pain, diarrhea and nausea. Negative for abdominal distention, anal bleeding, blood in stool, constipation, rectal pain and vomiting.  Musculoskeletal: Negative.   Neurological: Negative for dizziness, light-headedness and headaches.    Social History  Substance Use Topics  . Smoking status: Current Some Day Smoker  . Smokeless tobacco: Never Used     Comment: smokes cigars  . Alcohol use 0.0 oz/week     Comment: ocassional alcohol use; Mixed drinks once every 2-3 months   Objective:   BP 116/82 (BP  Location: Left Wrist, Patient Position: Sitting, Cuff Size: Normal)   Pulse 84   Temp 98.7 F (37.1 C) (Oral)   Resp 16   Wt 274 lb (124.3 kg)   BMI 53.51 kg/m   Physical Exam  Constitutional: She appears well-developed and well-nourished.  HENT:  Mouth/Throat: Mucous membranes are normal. Mucous membranes are not pale, not dry and not cyanotic.  Abdominal: Soft. She exhibits no distension. Bowel sounds are  decreased. There is no tenderness. There is no rebound and no guarding.  Skin: Skin is warm and dry. She is not diaphoretic. No pallor.  Skin with good turgor, no tenting.        Assessment & Plan:      Problem List Items Addressed This Visit    None    Visit Diagnoses    Gastroenteritis    -  Primary   Relevant Medications   ondansetron (ZOFRAN) 4 MG tablet     Patient is 30 y/o female presenting with symptoms concerning for gastroenteritis. Patient does not look clinically dehydrated. Will give anti-emetic to promote oral intake. Patient not having bloody bowel movements, may take imodium. Counseled on s/sx of dehydration, return precautions.  Return if symptoms worsen or fail to improve.  Patient Instructions  Diarrhea, Adult Introduction Diarrhea is when you have loose and water poop (stool) often. Diarrhea can make you feel weak and cause you to get dehydrated. Dehydration can make you tired and thirsty, make you have a dry mouth, and make it so you pee (urinate) less often. Diarrhea often lasts 2-3 days. However, it can last longer if it is a sign of something more serious. It is important to treat your diarrhea as told by your doctor. Follow these instructions at home: Eating and drinking Follow these recommendations as told by your doctor:  Take an oral rehydration solution (ORS). This is a drink that is sold at pharmacies and stores.  Drink clear fluids, such as:  Water.  Ice chips.  Diluted fruit juice.  Low-calorie sports drinks.  Eat bland, easy-to-digest foods in small amounts as you are able. These foods include:  Bananas.  Applesauce.  Rice.  Low-fat (lean) meats.  Toast.  Crackers.  Avoid drinking fluids that have a lot of sugar or caffeine in them.  Avoid alcohol.  Avoid spicy or fatty foods. General instructions  Drink enough fluid to keep your pee (urine) clear or pale yellow.  Wash your hands often. If you cannot use soap and water,  use hand sanitizer.  Make sure that all people in your home wash their hands well and often.  Take over-the-counter and prescription medicines only as told by your doctor.  Rest at home while you get better.  Watch your condition for any changes.  Take a warm bath to help with any burning or pain from having diarrhea.  Keep all follow-up visits as told by your doctor. This is important. Contact a doctor if:  You have a fever.  Your diarrhea gets worse.  You have new symptoms.  You cannot keep fluids down.  You feel light-headed or dizzy.  You have a headache.  You have muscle cramps. Get help right away if:  You have chest pain.  You feel very weak or you pass out (faint).  You have bloody or black poop or poop that look like tar.  You have very bad pain, cramping, or bloating in your belly (abdomen).  You have trouble breathing or you are breathing very quickly.  Your heart is beating very quickly.  Your skin feels cold and clammy.  You feel confused.  You have signs of dehydration, such as:  Dark pee, hardly any pee, or no pee.  Cracked lips.  Dry mouth.  Sunken eyes.  Sleepiness.  Weakness. This information is not intended to replace advice given to you by your health care provider. Make sure you discuss any questions you have with your health care provider. Document Released: 10/26/2007 Document Revised: 11/27/2015 Document Reviewed: 01/13/2015  2017 Elsevier    The entirety of the information documented in the History of Present Illness, Review of Systems and Physical Exam were personally obtained by me. Portions of this information were initially documented by Kavin Leech, CMA and reviewed by me for thoroughness and accuracy.       Trey Sailors, [name]  [facility]

## 2016-05-04 DIAGNOSIS — I10 Essential (primary) hypertension: Secondary | ICD-10-CM | POA: Insufficient documentation

## 2016-06-09 ENCOUNTER — Ambulatory Visit: Payer: Medicaid Other | Admitting: Physician Assistant

## 2016-06-14 ENCOUNTER — Ambulatory Visit (INDEPENDENT_AMBULATORY_CARE_PROVIDER_SITE_OTHER): Payer: Medicaid Other | Admitting: Physician Assistant

## 2016-06-14 VITALS — Temp 98.2°F

## 2016-06-14 DIAGNOSIS — Z3042 Encounter for surveillance of injectable contraceptive: Secondary | ICD-10-CM | POA: Diagnosis not present

## 2016-06-14 MED ORDER — MEDROXYPROGESTERONE ACETATE 150 MG/ML IM SUSP
150.0000 mg | Freq: Once | INTRAMUSCULAR | Status: AC
  Administered 2016-06-14: 150 mg via INTRAMUSCULAR

## 2016-06-14 NOTE — Progress Notes (Signed)
Nurse Visit for Depo-Provera. Patient tolerated injection well. Used a 25G 1 1/2 inch needle. Patient is to return for next injection April 10 - April 24.

## 2016-06-24 ENCOUNTER — Encounter: Payer: Self-pay | Admitting: Physician Assistant

## 2016-06-24 ENCOUNTER — Ambulatory Visit (INDEPENDENT_AMBULATORY_CARE_PROVIDER_SITE_OTHER): Payer: Medicaid Other | Admitting: Physician Assistant

## 2016-06-24 VITALS — BP 140/80 | HR 84 | Temp 98.5°F | Resp 16 | Ht 60.0 in | Wt 278.0 lb

## 2016-06-24 DIAGNOSIS — E559 Vitamin D deficiency, unspecified: Secondary | ICD-10-CM | POA: Diagnosis not present

## 2016-06-24 DIAGNOSIS — Z8679 Personal history of other diseases of the circulatory system: Secondary | ICD-10-CM | POA: Diagnosis not present

## 2016-06-24 DIAGNOSIS — Z8632 Personal history of gestational diabetes: Secondary | ICD-10-CM

## 2016-06-24 DIAGNOSIS — Z13220 Encounter for screening for lipoid disorders: Secondary | ICD-10-CM | POA: Diagnosis not present

## 2016-06-24 DIAGNOSIS — R609 Edema, unspecified: Secondary | ICD-10-CM | POA: Diagnosis not present

## 2016-06-24 DIAGNOSIS — Z136 Encounter for screening for cardiovascular disorders: Secondary | ICD-10-CM | POA: Diagnosis not present

## 2016-06-24 MED ORDER — POTASSIUM CHLORIDE ER 10 MEQ PO TBCR: EXTENDED_RELEASE_TABLET | ORAL | 3 refills | Status: DC

## 2016-06-24 MED ORDER — FUROSEMIDE 20 MG PO TABS: 20.0000 mg | ORAL_TABLET | Freq: Every day | ORAL | 3 refills | Status: DC

## 2016-06-24 NOTE — Patient Instructions (Signed)
Furosemide tablets What is this medicine? FUROSEMIDE (fyoor [place] se mide) is a diuretic. It helps you make more urine and to lose salt and excess water from your body. This medicine is used to treat high blood pressure, and edema or swelling from heart, kidney, or liver disease. This medicine may be used for other purposes; ask your health care provider or pharmacist if you have questions. COMMON BRAND NAME(S): Active-Medicated Specimen Kit, Delone, Diuscreen, Lasix, RX Specimen Collection Kit, Specimen Collection Kit, URINX Medicated Specimen Collection What should I tell my health care provider before I take this medicine? They need to know if you have any of these conditions: -abnormal blood electrolytes -diarrhea or vomiting -gout -heart disease -kidney disease, small amounts of urine, or difficulty passing urine -liver disease -thyroid disease -an unusual or allergic reaction to furosemide, sulfa drugs, other medicines, foods, dyes, or preservatives -pregnant or trying to get pregnant -breast-feeding How should I use this medicine? Take this medicine by mouth with a glass of water. Follow the directions on the prescription label. You may take this medicine with or without food. If it upsets your stomach, take it with food or milk. Do not take your medicine more often than directed. Remember that you will need to pass more urine after taking this medicine. Do not take your medicine at a time of day that will cause you problems. Do not take at bedtime. Talk to your pediatrician regarding the use of this medicine in children. While this drug may be prescribed for selected conditions, precautions do apply. Overdosage: If you think you have taken too much of this medicine contact a poison control center or emergency room at once. NOTE: This medicine is only for you. Do not share this medicine with others. What if I miss a dose? If you miss a dose, take it as soon as you can. If it is almost time  for your next dose, take only that dose. Do not take double or extra doses. What may interact with this medicine? -aspirin and aspirin-like medicines -certain antibiotics -chloral hydrate -cisplatin -cyclosporine -digoxin -diuretics -laxatives -lithium -medicines for blood pressure -medicines that relax muscles for surgery -methotrexate -NSAIDs, medicines for pain and inflammation like ibuprofen, naproxen, or indomethacin -phenytoin -steroid medicines like prednisone or cortisone -sucralfate -thyroid hormones This list may not describe all possible interactions. Give your health care provider a list of all the medicines, herbs, non-prescription drugs, or dietary supplements you use. Also tell them if you smoke, drink alcohol, or use illegal drugs. Some items may interact with your medicine. What should I watch for while using this medicine? Visit your doctor or health care professional for regular checks on your progress. Check your blood pressure regularly. Ask your doctor or health care professional what your blood pressure should be, and when you should contact him or her. If you are a diabetic, check your blood sugar as directed. You may need to be on a special diet while taking this medicine. Check with your doctor. Also, ask how many glasses of fluid you need to drink a day. You must not get dehydrated. You may get drowsy or dizzy. Do not drive, use machinery, or do anything that needs mental alertness until you know how this drug affects you. Do not stand or sit up quickly, especially if you are an older patient. This reduces the risk of dizzy or fainting spells. Alcohol can make you more drowsy and dizzy. Avoid alcoholic drinks. This medicine can make you more sensitive  to the sun. Keep out of the sun. If you cannot avoid being in the sun, wear protective clothing and use sunscreen. Do not use sun lamps or tanning beds/booths. What side effects may I notice from receiving this  medicine? Side effects that you should report to your doctor or health care professional as soon as possible: -blood in urine or stools -dry mouth -fever or chills -hearing loss or ringing in the ears -irregular heartbeat -muscle pain or weakness, cramps -skin rash -stomach upset, pain, or nausea -tingling or numbness in the hands or feet -unusually weak or tired -vomiting or diarrhea -yellowing of the eyes or skin Side effects that usually do not require medical attention (report to your doctor or health care professional if they continue or are bothersome): -headache -loss of appetite -unusual bleeding or bruising This list may not describe all possible side effects. Call your doctor for medical advice about side effects. You may report side effects to FDA at 1-800-FDA-1088. Where should I keep my medicine? Keep out of the reach of children. Store at room temperature between 15 and 30 degrees C (59 and 86 degrees F). Protect from light. Throw away any unused medicine after the expiration date. NOTE: This sheet is a summary. It may not cover all possible information. If you have questions about this medicine, talk to your doctor, pharmacist, or health care provider.  2017 Elsevier/Gold Standard (2014-07-30 13:49:50) Edema Edema is an abnormal buildup of fluids. It is more common in your legs and thighs. Painless swelling of the feet and ankles is more likely as a person ages. It also is common in looser skin, like around your eyes. Follow these instructions at home:  Keep the affected body part above the level of the heart while lying down.  Do not sit still or stand for a long time.  Do not put anything right under your knees when you lie down.  Do not wear tight clothes on your upper legs.  Exercise your legs to help the puffiness (swelling) go down.  Wear elastic bandages or support stockings as told by your doctor.  A low-salt diet may help lessen the puffiness.  Only  take medicine as told by your doctor. Contact a doctor if:  Treatment is not working.  You have heart, liver, or kidney disease and notice that your skin looks puffy or shiny.  You have puffiness in your legs that does not get better when you raise your legs.  You have sudden weight gain for no reason. Get help right away if:  You have shortness of breath or chest pain.  You cannot breathe when you lie down.  You have pain, redness, or warmth in the areas that are puffy.  You have heart, liver, or kidney disease and get edema all of a sudden.  You have a fever and your symptoms get worse all of a sudden. This information is not intended to replace advice given to you by your health care provider. Make sure you discuss any questions you have with your health care provider. Document Released: 10/26/2007 Document Revised: 10/15/2015 Document Reviewed: 03/01/2013 Elsevier Interactive Patient Education  2017 [employer].

## 2016-06-24 NOTE — Progress Notes (Signed)
Patient: [name] Female    DOB: February 24, 1986   30 y.o.   MRN: [id number] Visit Date: 06/24/2016  Today's Provider: Margaretann Loveless, [name]   Chief Complaint  Patient presents with  . Edema   Subjective:    HPI Patient here c/o of swelling in legs and face. Patient reports symptoms are recurrent x's several months. Patient reports that when she notices the swelling she increases her water intake and reports moderate decrease of swelling due to voiding frequently. Patient denies shortness of breath, chest pain or cough.   She also reports increasing fatigue. She does have a history of vit D def and has not been taking her vitamin D. She states it feels similar to when she was really low in vit D before.  She would also like to have her sugar checked since she has had elevated sugars in the past and strong family history.     Allergies  Allergen Reactions  . Lactose Intolerance (Gi) Diarrhea  . Penicillins Hives    Has patient had a PCN reaction causing immediate rash, facial/tongue/throat swelling, SOB or lightheadedness with hypotension: Yes Has patient had a PCN reaction causing severe rash involving mucus membranes or skin necrosis: No Has patient had a PCN reaction that required hospitalization No Has patient had a PCN reaction occurring within the last 10 years: Yes If all of the above answers are "NO", then may proceed with Cephalosporin use.  Marland Kitchen Pineapple Other (See Comments)    Patient states this cuts my mouth and makes it bleed.   Patient Active Problem List   Diagnosis Date Noted  . Acute frontal sinusitis 05/14/2015  . Pre-eclampsia, delivered 02/12/2015  . Abnormal vaginal Pap smear 02/05/2015  . Abortion, spontaneous 02/05/2015  . Anxiety 02/05/2015  . LBP (low back pain) 02/05/2015  . Body mass index of 60 or higher 02/05/2015  . Fatigue 02/05/2015  . Fibrositis 02/05/2015  . Folliculitis 02/05/2015  . Acid reflux 02/05/2015  . Diabetes mellitus  arising in pregnancy 02/05/2015  . High risk sexual behavior 02/05/2015  . 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/2015  . Obesity 02/05/2015  . Obstructive sleep apnea 02/05/2015  . Vitamin D deficiency 02/05/2015  . Depression 02/05/2015  . Fibromyalgia 02/05/2015  . Abnormal C-reactive protein 10/24/2012  . Chronic LBP 08/13/2012  . Absence of menstruation 09/22/2009  . Migraine without status migrainosus 07/22/2008     Current Outpatient Prescriptions:  .  albuterol (PROAIR HFA) 108 (90 BASE) MCG/ACT inhaler, Inhale 1-2 puffs into the lungs every 6 (six) hours as needed for wheezing or shortness of breath. , Disp: , Rfl:  .  montelukast (SINGULAIR) 10 MG tablet, Take 1 tablet (10 mg total) by mouth at bedtime., Disp: 30 tablet, Rfl: 3  Review of Systems  Constitutional: Negative.   Respiratory: Negative.   Cardiovascular: Positive for leg swelling. Negative for chest pain and palpitations.  Gastrointestinal: Negative.   Genitourinary: Negative.   Neurological: Negative.     Social History  Substance Use Topics  . Smoking status: Current Some Day Smoker    Types: Cigars  . Smokeless tobacco: Never Used     Comment: smokes cigars  . Alcohol use 0.0 oz/week     Comment: ocassional alcohol use; Mixed drinks once every 2-3 months   Objective:   BP 140/80 (BP Location: Left Arm, Patient Position: Sitting, Cuff Size: Large)   Pulse 84   Temp 98.5 F (36.9 C) (Oral)   Resp 16  Ht 5' (1.524 m)   Wt 278 lb (126.1 kg)   SpO2 98%   BMI 54.29 kg/m   Physical Exam  Constitutional: She appears well-developed and well-nourished. No distress.  Neck: Normal range of motion. Neck supple. No JVD present. No tracheal deviation present. No thyromegaly present.  Cardiovascular: Normal rate, regular rhythm, normal heart sounds and intact distal pulses.  Exam reveals no gallop and no friction rub.   No murmur heard. Pulmonary/Chest: Effort normal and breath sounds normal. No respiratory distress.  She has no wheezes. She has no rales.  Musculoskeletal: She exhibits no edema.  Lymphadenopathy:    She has no cervical adenopathy.  Skin: She is not diaphoretic.  Vitals reviewed.     Assessment & Plan:     1. Body fluid retention Will check labs as below for possible causes. If labs are normal will start furosemide prn. She is to take the potassium supplement on the same days she takes the furosemide. She is to call the office if symptoms fail to improve or worsen. I will see her back in 4 weeks to recheck her BP also since it was slightly elevated today in the office. - CBC w/Diff/Platelet - Comprehensive Metabolic Panel (CMET) - TSH - B Nat Peptide - furosemide (LASIX) 20 MG tablet; Take 1 tablet (20 mg total) by mouth daily.  Dispense: 30 tablet; Refill: 3 - potassium chloride (K-DUR) 10 MEQ tablet; Take one tablet PO with furosemide  Dispense: 30 tablet; Refill: 3  2. Vitamin D deficiency H/O this. Not on supplementation. Will check labs.  Vit D level 15.3. Will add high dose supplementation back as below. We will recheck in 3 months. - Vitamin D (25 hydroxy) - Vitamin D, Ergocalciferol, (DRISDOL) 50000 units CAPS capsule; Take 1 capsule (50,000 Units total) by mouth every 7 (seven) days.  Dispense: 12 capsule; Refill: 1  3. H/O acquired congestive heart failure Previously had heart failure during pregnancy. Will check labs as below to make sure she is not having symptoms again. Furosemide and potassium also given above. - B Nat Peptide  4. H/O gestational diabetes mellitus, not currently pregnant Will check labs. Strong family history as well.  - HgB A1c  5. Encounter for lipid screening for cardiovascular disease Will check labs as below and f/u pending results. - Lipid Profile       Margaretann LovelessJennifer M [name], [name]  [facility]

## 2016-06-25 LAB — CBC WITH DIFFERENTIAL/PLATELET
BASOS: 1 %
Basophils Absolute: 0 10*3/uL (ref 0.0–0.2)
EOS (ABSOLUTE): 0.1 10*3/uL (ref 0.0–0.4)
Eos: 1 %
Hematocrit: 37.8 % (ref 34.0–46.6)
Hemoglobin: 13 g/dL (ref 11.1–15.9)
IMMATURE GRANS (ABS): 0 10*3/uL (ref 0.0–0.1)
Immature Granulocytes: 0 %
LYMPHS: 33 %
Lymphocytes Absolute: 2 10*3/uL (ref 0.7–3.1)
MCH: 31.4 pg (ref 26.6–33.0)
MCHC: 34.4 g/dL (ref 31.5–35.7)
MCV: 91 fL (ref 79–97)
Monocytes Absolute: 0.3 10*3/uL (ref 0.1–0.9)
Monocytes: 5 %
NEUTROS ABS: 3.7 10*3/uL (ref 1.4–7.0)
Neutrophils: 60 %
PLATELETS: 252 10*3/uL (ref 150–379)
RBC: 4.14 x10E6/uL (ref 3.77–5.28)
RDW: 13.8 % (ref 12.3–15.4)
WBC: 6.2 10*3/uL (ref 3.4–10.8)

## 2016-06-25 LAB — COMPREHENSIVE METABOLIC PANEL
A/G RATIO: 1.5 (ref 1.2–2.2)
ALT: 13 IU/L (ref 0–32)
AST: 19 IU/L (ref 0–40)
Albumin: 4.3 g/dL (ref 3.5–5.5)
Alkaline Phosphatase: 82 IU/L (ref 39–117)
BILIRUBIN TOTAL: 0.2 mg/dL (ref 0.0–1.2)
BUN / CREAT RATIO: 18 (ref 9–23)
BUN: 13 mg/dL (ref 6–20)
CHLORIDE: 100 mmol/L (ref 96–106)
CO2: 18 mmol/L (ref 18–29)
Calcium: 9.3 mg/dL (ref 8.7–10.2)
Creatinine, Ser: 0.73 mg/dL (ref 0.57–1.00)
GFR calc non Af Amer: 110 mL/min/{1.73_m2} (ref >59)
GFR, EST AFRICAN AMERICAN: 127 mL/min/{1.73_m2} (ref >59)
Globulin, Total: 2.8 g/dL (ref 1.5–4.5)
Glucose: 85 mg/dL (ref 65–99)
POTASSIUM: 4.4 mmol/L (ref 3.5–5.2)
SODIUM: 139 mmol/L (ref 134–144)
TOTAL PROTEIN: 7.1 g/dL (ref 6.0–8.5)

## 2016-06-25 LAB — LIPID PANEL
Chol/HDL Ratio: 2.8 ratio units (ref 0.0–4.4)
Cholesterol, Total: 156 mg/dL (ref 100–199)
HDL: 56 mg/dL (ref >39)
LDL Calculated: 92 mg/dL (ref 0–99)
Triglycerides: 41 mg/dL (ref 0–149)
VLDL Cholesterol Cal: 8 mg/dL (ref 5–40)

## 2016-06-25 LAB — BRAIN NATRIURETIC PEPTIDE: BNP: 18.2 pg/mL (ref 0.0–100.0)

## 2016-06-25 LAB — VITAMIN D 25 HYDROXY (VIT D DEFICIENCY, FRACTURES): VIT D 25 HYDROXY: 15.4 ng/mL — AB (ref 30.0–100.0)

## 2016-06-25 LAB — HEMOGLOBIN A1C
ESTIMATED AVERAGE GLUCOSE: 91 mg/dL
HEMOGLOBIN A1C: 4.8 % (ref 4.8–5.6)

## 2016-06-25 LAB — TSH: TSH: 1.61 u[IU]/mL (ref 0.450–4.500)

## 2016-06-26 MED ORDER — VITAMIN D (ERGOCALCIFEROL) 1.25 MG (50000 UNIT) PO CAPS: 50000.0000 [IU] | ORAL_CAPSULE | ORAL | 1 refills | Status: DC

## 2016-06-27 ENCOUNTER — Telehealth: Payer: Self-pay

## 2016-06-27 NOTE — Telephone Encounter (Signed)
-----   Message from Margaretann LovelessJennifer M Burnette, [place]PA-C sent at 06/26/2016  9:16 AM EST ----- All labs are WNL with exception of Vit D. Will send in Rx for Vit D that you will take once weekly.

## 2016-06-27 NOTE — Telephone Encounter (Signed)
Advised pt of lab results. Pt verbally acknowledges understanding. Vear Staton Drozdowski, CMA   

## 2016-07-07 ENCOUNTER — Telehealth: Payer: Self-pay | Admitting: Physician Assistant

## 2016-07-07 NOTE — Telephone Encounter (Signed)
Pt has some question regarding her depo birth control.  Her call back is [phone]  She wants to know if she get this on a Saturday and how early can she get them,   She starts a new job and has scheduling problems.  Thanks Barth Kirkseri

## 2016-07-07 NOTE — Telephone Encounter (Signed)
Patient advised that she is to get her BC injection April,10-April, 24.

## 2016-07-22 ENCOUNTER — Encounter: Payer: Self-pay | Admitting: Physician Assistant

## 2016-07-22 ENCOUNTER — Ambulatory Visit (INDEPENDENT_AMBULATORY_CARE_PROVIDER_SITE_OTHER): Payer: Medicaid Other | Admitting: Physician Assistant

## 2016-07-22 VITALS — BP 130/82 | HR 94 | Temp 98.5°F | Resp 16 | Ht 60.0 in | Wt 283.2 lb

## 2016-07-22 DIAGNOSIS — Z0000 Encounter for general adult medical examination without abnormal findings: Secondary | ICD-10-CM | POA: Diagnosis not present

## 2016-07-22 DIAGNOSIS — R87629 Unspecified abnormal cytological findings in specimens from vagina: Secondary | ICD-10-CM | POA: Diagnosis not present

## 2016-07-22 DIAGNOSIS — Z113 Encounter for screening for infections with a predominantly sexual mode of transmission: Secondary | ICD-10-CM | POA: Diagnosis not present

## 2016-07-22 DIAGNOSIS — J4520 Mild intermittent asthma, uncomplicated: Secondary | ICD-10-CM | POA: Diagnosis not present

## 2016-07-22 DIAGNOSIS — Z124 Encounter for screening for malignant neoplasm of cervix: Secondary | ICD-10-CM | POA: Diagnosis not present

## 2016-07-22 DIAGNOSIS — J302 Other seasonal allergic rhinitis: Secondary | ICD-10-CM | POA: Diagnosis not present

## 2016-07-22 MED ORDER — ALBUTEROL SULFATE HFA 108 (90 BASE) MCG/ACT IN AERS: 1.0000 | INHALATION_SPRAY | Freq: Four times a day (QID) | RESPIRATORY_TRACT | 3 refills | Status: DC | PRN

## 2016-07-22 MED ORDER — MONTELUKAST SODIUM 10 MG PO TABS: 10.0000 mg | ORAL_TABLET | Freq: Every day | ORAL | 3 refills | Status: DC

## 2016-07-22 NOTE — Progress Notes (Signed)
Patient: [name], Female    DOB: 1985/10/11, 31 y.o.   MRN: [id number] Visit Date: 07/22/2016  Today's Provider: Margaretann Loveless, [name]   Chief Complaint  Patient presents with  . Annual Exam   Subjective:    Annual physical exam [name] is a 31 y.o. female who presents today for health maintenance and complete physical. She feels well. She reports exercising some. She reports she is sleeping fairly well.  Last CPE:09/10/15 Pap:09/10/15 neg, HPV-neg; We are repeating pap today because in 2015 she had an abnormal and this will make her third normal to restart normal screenings again. ----------------------------------------------------------------- Body Fluid Retention:06/24/16 patient was started on Furosemide and was to take potassium supplement on the days she takes the furosemide.  Review of Systems  Constitutional: Negative.   HENT: Positive for rhinorrhea, sinus pressure, sneezing, sore throat and tinnitus.   Eyes: Negative.   Respiratory: Positive for wheezing.   Cardiovascular: Negative.   Gastrointestinal: Negative.   Endocrine: Negative.   Genitourinary: Negative.   Musculoskeletal: Positive for back pain, myalgias and neck stiffness.  Skin: Negative.   Allergic/Immunologic: Positive for environmental allergies and food allergies.  Neurological: Positive for headaches.  Hematological: Negative.   Psychiatric/Behavioral: Negative.     Social History      She  reports that she has been smoking Cigars.  She has never used smokeless tobacco. She reports that she drinks alcohol. She reports that she does not use drugs.       Social History   Social History  . Marital status: Single    Spouse name: N/A  . Number of children: 1  . Years of education: high schoo   Social History Main Topics  . Smoking status: Current Some Day Smoker    Types: Cigars  . Smokeless tobacco: Never Used     Comment: smokes cigars  . Alcohol use 0.0 oz/week       Comment: ocassional alcohol use; Mixed drinks once every 2-3 months  . Drug use: No  . Sexual activity: Yes   Other Topics Concern  . None   Social History Narrative  . None    Past Medical History:  Diagnosis Date  . Anxiety   . Depression   . Elevated blood pressure   . GERD (gastroesophageal reflux disease)   . Sleep apnea      Patient Active Problem List   Diagnosis Date Noted  . Acute frontal sinusitis 05/14/2015  . Pre-eclampsia, delivered 02/12/2015  . Abnormal vaginal Pap smear 02/05/2015  . Abortion, spontaneous 02/05/2015  . Anxiety 02/05/2015  . LBP (low back pain) 02/05/2015  . Body mass index of 60 or higher 02/05/2015  . Fatigue 02/05/2015  . Fibrositis 02/05/2015  . Folliculitis 02/05/2015  . Acid reflux 02/05/2015  . Diabetes mellitus arising in pregnancy 02/05/2015  . High risk sexual behavior 02/05/2015  . 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/2015  . Obesity 02/05/2015  . Obstructive sleep apnea 02/05/2015  . Vitamin D deficiency 02/05/2015  . Depression 02/05/2015  . Fibromyalgia 02/05/2015  . Abnormal C-reactive protein 10/24/2012  . Chronic LBP 08/13/2012  . Absence of menstruation 09/22/2009  . Migraine without status migrainosus 07/22/2008    Past Surgical History:  Procedure Laterality Date  . CESAREAN SECTION     at 28 weeks for pre-eclampsia, pulmonary edema, gestational DM.  Marland Kitchen dilationand curettage of Uterus  2007   SAB  . Headache:2009     Headache wellness consult. during  pregnancy    Family History        Family Status  Relation Status  . Mother Alive   PUD'S  . Father Alive   not part of life  . Daughter Alive  . Maternal Grandmother Alive   CAD, Glaucoma  . Maternal Grandfather Deceased at age 32   died from prostate cancer; Alzheimer's  . Paternal Grandmother Deceased at age 32   died of unknown causes  . Paternal Grandfather Deceased   died of unknown causes        Her family history includes Hypertension in her  maternal grandmother.     Allergies  Allergen Reactions  . Lactose Intolerance (Gi) Diarrhea  . Penicillins Hives    Has patient had a PCN reaction causing immediate rash, facial/tongue/throat swelling, SOB or lightheadedness with hypotension: Yes Has patient had a PCN reaction causing severe rash involving mucus membranes or skin necrosis: No Has patient had a PCN reaction that required hospitalization No Has patient had a PCN reaction occurring within the last 10 years: Yes If all of the above answers are "NO", then may proceed with Cephalosporin use.  Marland Kitchen. Pineapple Other (See Comments)    Patient states this cuts my mouth and makes it bleed.     Current Outpatient Prescriptions:  .  albuterol (PROAIR HFA) 108 (90 BASE) MCG/ACT inhaler, Inhale 1-2 puffs into the lungs every 6 (six) hours as needed for wheezing or shortness of breath. , Disp: , Rfl:  .  furosemide (LASIX) 20 MG tablet, Take 1 tablet (20 mg total) by mouth daily., Disp: 30 tablet, Rfl: 3 .  montelukast (SINGULAIR) 10 MG tablet, Take 1 tablet (10 mg total) by mouth at bedtime., Disp: 30 tablet, Rfl: 3 .  potassium chloride (K-DUR) 10 MEQ tablet, Take one tablet PO with furosemide, Disp: 30 tablet, Rfl: 3 .  Vitamin D, Ergocalciferol, (DRISDOL) 50000 units CAPS capsule, Take 1 capsule (50,000 Units total) by mouth every 7 (seven) days., Disp: 12 capsule, Rfl: 1   Patient Care Team: Margaretann LovelessJennifer M Burnette, [name] as PCP - General (Family Medicine)      Objective:   Vitals: BP 130/82 (BP Location: Right Arm, Patient Position: Sitting, Cuff Size: Normal)   Pulse 94   Temp 98.5 F (36.9 C) (Oral)   Resp 16   Ht 5' (1.524 m)   Wt 283 lb 3.2 oz (128.5 kg)   BMI 55.31 kg/m     Physical Exam  Constitutional: She is oriented to person, place, and time. She appears well-developed and well-nourished. No distress.  Morbidly obese  HENT:  Head: Normocephalic and atraumatic.  Right Ear: Hearing, tympanic membrane, external ear  and ear canal normal.  Left Ear: Hearing, tympanic membrane, external ear and ear canal normal.  Nose: Nose normal.  Mouth/Throat: Uvula is midline, oropharynx is clear and moist and mucous membranes are normal. No oropharyngeal exudate.  Eyes: Conjunctivae and EOM are normal. Pupils are equal, round, and reactive to light. Right eye exhibits no discharge. Left eye exhibits no discharge. No scleral icterus.  Neck: Normal range of motion. Neck supple. No JVD present. Carotid bruit is not present. No tracheal deviation present. No thyromegaly present.  Cardiovascular: Normal rate, regular rhythm, normal heart sounds and intact distal pulses.  Exam reveals no gallop and no friction rub.   No murmur heard. Pulmonary/Chest: Effort normal and breath sounds normal. No respiratory distress. She has no wheezes. She has no rales. She exhibits no tenderness. Right breast exhibits  no inverted nipple, no mass, no nipple discharge, no skin change and no tenderness. Left breast exhibits no inverted nipple, no mass, no nipple discharge, no skin change and no tenderness. Breasts are symmetrical.  Abdominal: Soft. Bowel sounds are normal. She exhibits no distension and no mass. There is no tenderness. There is no rebound and no guarding. Hernia confirmed negative in the right inguinal area and confirmed negative in the left inguinal area.  Genitourinary: Rectum normal, vagina normal and uterus normal. No breast swelling, tenderness, discharge or bleeding. Pelvic exam was performed with patient supine. There is no rash, tenderness, lesion or injury on the right labia. There is no rash, tenderness, lesion or injury on the left labia. Cervix exhibits no motion tenderness, no discharge and no friability. Right adnexum displays no mass, no tenderness and no fullness. Left adnexum displays no mass, no tenderness and no fullness. No erythema, tenderness or bleeding in the vagina. No signs of injury around the vagina. No vaginal  discharge found.  Musculoskeletal: Normal range of motion. She exhibits no edema or tenderness.  Lymphadenopathy:    She has no cervical adenopathy.       Right: No inguinal adenopathy present.       Left: No inguinal adenopathy present.  Neurological: She is alert and oriented to person, place, and time. She has normal reflexes. No cranial nerve deficit. Coordination normal.  Skin: Skin is warm and dry. No rash noted. She is not diaphoretic.  Psychiatric: She has a normal mood and affect. Her behavior is normal. Judgment and thought content normal.  Vitals reviewed.    Depression Screen PHQ 2/9 Scores 07/22/2016 09/10/2015 04/01/2015  PHQ - 2 Score 2 2 0  PHQ- 9 Score 2 7 -   Current Exercise Habits: The patient does not participate in regular exercise at present Exercise limited by: None identified   Assessment & Plan:     Routine Health Maintenance and Physical Exam  Exercise Activities and Dietary recommendations Goals    None      Immunization History  Administered Date(s) Administered  . HPV Quadrivalent 04/20/2011    Health Maintenance  Topic Date Due  . PNEUMOCOCCAL POLYSACCHARIDE VACCINE (1) 08/30/1986  . FOOT EXAM  08/30/1994  . OPHTHALMOLOGY EXAM  08/30/1994  . URINE MICROALBUMIN  08/30/1994  . TETANUS/TDAP  08/30/2003  . INFLUENZA VACCINE  12/22/2015  . HEMOGLOBIN A1C  12/22/2016  . PAP SMEAR  09/10/2018  . HIV Screening  Completed     Discussed health benefits of physical activity, and encouraged her to engage in regular exercise appropriate for her age and condition.    1. Annual physical exam Normal physical exam today. She is to call the office in the meantime if she has any acute issue, questions or concerns. Will see her back in one year for annual CPE.  2. Cervical cancer screening Pap collected today. Will send as below and f/u pending results. - Pap IG, CT/NG w/ reflex HPV when ASC-U  3. Other seasonal allergic rhinitis Stable. Diagnosis  pulled for medication refill. Continue current medical treatment plan. - montelukast (SINGULAIR) 10 MG tablet; Take 1 tablet (10 mg total) by mouth at bedtime.  Dispense: 30 tablet; Refill: 3  4. RAD (reactive airway disease), mild intermittent, uncomplicated Stable. Diagnosis pulled for medication refill. Continue current medical treatment plan. - montelukast (SINGULAIR) 10 MG tablet; Take 1 tablet (10 mg total) by mouth at bedtime.  Dispense: 30 tablet; Refill: 3 - albuterol (PROAIR HFA) 108 (90  Base) MCG/ACT inhaler; Inhale 1-2 puffs into the lungs every 6 (six) hours as needed for wheezing or shortness of breath.  Dispense: 18 g; Refill: 3  5. Abnormal vaginal Pap smear Abnormal pap in 2015. Pap in 2016 and 2017 were normal. If this pap is normal she may return to normal screening.  6. Screen for STD (sexually transmitted disease) Will check labs as below and f/u pending results. - HIV antibody (with reflex) - RPR  --------------------------------------------------------------------    Margaretann Loveless, [name]  [facility]

## 2016-07-22 NOTE — Patient Instructions (Signed)
Health Maintenance, Female Adopting a healthy lifestyle and getting preventive care can go a long way to promote health and wellness. Talk with your health care provider about what schedule of regular examinations is right for you. This is a good chance for you to check in with your provider about disease prevention and staying healthy. In between checkups, there are plenty of things you can do on your own. Experts have done a lot of research about which lifestyle changes and preventive measures are most likely to keep you healthy. Ask your health care provider for more information. Weight and diet Eat a healthy diet  Be sure to include plenty of vegetables, fruits, low-fat dairy products, and lean protein.  Do not eat a lot of foods high in solid fats, added sugars, or salt.  Get regular exercise. This is one of the most important things you can do for your health.  Most adults should exercise for at least 150 minutes each week. The exercise should increase your heart rate and make you sweat (moderate-intensity exercise).  Most adults should also do strengthening exercises at least twice a week. This is in addition to the moderate-intensity exercise. Maintain a healthy weight  Body mass index (BMI) is a measurement that can be used to identify possible weight problems. It estimates body fat based on height and weight. Your health care provider can help determine your BMI and help you achieve or maintain a healthy weight.  For females 76 years of age and older:  A BMI below 18.5 is considered underweight.  A BMI of 18.5 to 24.9 is normal.  A BMI of 25 to 29.9 is considered overweight.  A BMI of 30 and above is considered obese. Watch levels of cholesterol and blood lipids  You should start having your blood tested for lipids and cholesterol at 32 years of age, then have this test every 5 years.  You may need to have your cholesterol levels checked more often if:  Your lipid or  cholesterol levels are high.  You are older than 32 years of age.  You are at high risk for heart disease. Cancer screening Lung Cancer  Lung cancer screening is recommended for adults 64-42 years old who are at high risk for lung cancer because of a history of smoking.  A yearly low-dose CT scan of the lungs is recommended for people who:  Currently smoke.  Have quit within the past 15 years.  Have at least a 30-pack-year history of smoking. A pack year is smoking an average of one pack of cigarettes a day for 1 year.  Yearly screening should continue until it has been 15 years since you quit.  Yearly screening should stop if you develop a health problem that would prevent you from having lung cancer treatment. Breast Cancer  Practice breast self-awareness. This means understanding how your breasts normally appear and feel.  It also means doing regular breast self-exams. Let your health care provider know about any changes, no matter how small.  If you are in your 20s or 30s, you should have a clinical breast exam (CBE) by a health care provider every 1-3 years as part of a regular health exam.  If you are 34 or older, have a CBE every year. Also consider having a breast X-ray (mammogram) every year.  If you have a family history of breast cancer, talk to your health care provider about genetic screening.  If you are at high risk for breast cancer, talk  to your health care provider about having an MRI and a mammogram every year.  Breast cancer gene (BRCA) assessment is recommended for women who have family members with BRCA-related cancers. BRCA-related cancers include:  Breast.  Ovarian.  Tubal.  Peritoneal cancers.  Results of the assessment will determine the need for genetic counseling and BRCA1 and BRCA2 testing. Cervical Cancer  Your health care provider may recommend that you be screened regularly for cancer of the pelvic organs (ovaries, uterus, and vagina).  This screening involves a pelvic examination, including checking for microscopic changes to the surface of your cervix (Pap test). You may be encouraged to have this screening done every 3 years, beginning at age 24.  For women ages 66-65, health care providers may recommend pelvic exams and Pap testing every 3 years, or they may recommend the Pap and pelvic exam, combined with testing for human papilloma virus (HPV), every 5 years. Some types of HPV increase your risk of cervical cancer. Testing for HPV may also be done on women of any age with unclear Pap test results.  Other health care providers may not recommend any screening for nonpregnant women who are considered low risk for pelvic cancer and who do not have symptoms. Ask your health care provider if a screening pelvic exam is right for you.  If you have had past treatment for cervical cancer or a condition that could lead to cancer, you need Pap tests and screening for cancer for at least 20 years after your treatment. If Pap tests have been discontinued, your risk factors (such as having a new sexual partner) need to be reassessed to determine if screening should resume. Some women have medical problems that increase the chance of getting cervical cancer. In these cases, your health care provider may recommend more frequent screening and Pap tests. Colorectal Cancer  This type of cancer can be detected and often prevented.  Routine colorectal cancer screening usually begins at 32 years of age and continues through 32 years of age.  Your health care provider may recommend screening at an earlier age if you have risk factors for colon cancer.  Your health care provider may also recommend using home test kits to check for hidden blood in the stool.  A small camera at the end of a tube can be used to examine your colon directly (sigmoidoscopy or colonoscopy). This is done to check for the earliest forms of colorectal cancer.  Routine  screening usually begins at age 41.  Direct examination of the colon should be repeated every 5-10 years through 32 years of age. However, you may need to be screened more often if early forms of precancerous polyps or small growths are found. Skin Cancer  Check your skin from head to toe regularly.  Tell your health care provider about any new moles or changes in moles, especially if there is a change in a mole's shape or color.  Also tell your health care provider if you have a mole that is larger than the size of a pencil eraser.  Always use sunscreen. Apply sunscreen liberally and repeatedly throughout the day.  Protect yourself by wearing long sleeves, pants, a wide-brimmed hat, and sunglasses whenever you are outside. Heart disease, diabetes, and high blood pressure  High blood pressure causes heart disease and increases the risk of stroke. High blood pressure is more likely to develop in:  People who have blood pressure in the high end of the normal range (130-139/85-89 mm Hg).  People who are overweight or obese.  People who are African American.  If you are 59-24 years of age, have your blood pressure checked every 3-5 years. If you are 34 years of age or older, have your blood pressure checked every year. You should have your blood pressure measured twice-once when you are at a hospital or clinic, and once when you are not at a hospital or clinic. Record the average of the two measurements. To check your blood pressure when you are not at a hospital or clinic, you can use:  An automated blood pressure machine at a pharmacy.  A home blood pressure monitor.  If you are between 29 years and 60 years old, ask your health care provider if you should take aspirin to prevent strokes.  Have regular diabetes screenings. This involves taking a blood sample to check your fasting blood sugar level.  If you are at a normal weight and have a low risk for diabetes, have this test once  every three years after 32 years of age.  If you are overweight and have a high risk for diabetes, consider being tested at a younger age or more often. Preventing infection Hepatitis B  If you have a higher risk for hepatitis B, you should be screened for this virus. You are considered at high risk for hepatitis B if:  You were born in a country where hepatitis B is common. Ask your health care provider which countries are considered high risk.  Your parents were born in a high-risk country, and you have not been immunized against hepatitis B (hepatitis B vaccine).  You have HIV or AIDS.  You use needles to inject street drugs.  You live with someone who has hepatitis B.  You have had sex with someone who has hepatitis B.  You get hemodialysis treatment.  You take certain medicines for conditions, including cancer, organ transplantation, and autoimmune conditions. Hepatitis C  Blood testing is recommended for:  Everyone born from [birth year] through 1966.  Anyone with known risk factors for hepatitis C. Sexually transmitted infections (STIs)  You should be screened for sexually transmitted infections (STIs) including gonorrhea and chlamydia if:  You are sexually active and are younger than 32 years of age.  You are older than 32 years of age and your health care provider tells you that you are at risk for this type of infection.  Your sexual activity has changed since you were last screened and you are at an increased risk for chlamydia or gonorrhea. Ask your health care provider if you are at risk.  If you do not have HIV, but are at risk, it may be recommended that you take a prescription medicine daily to prevent HIV infection. This is called pre-exposure prophylaxis (PrEP). You are considered at risk if:  You are sexually active and do not regularly use condoms or know the HIV status of your partner(s).  You take drugs by injection.  You are sexually active with a partner  who has HIV. Talk with your health care provider about whether you are at high risk of being infected with HIV. If you choose to begin PrEP, you should first be tested for HIV. You should then be tested every 3 months for as long as you are taking PrEP. Pregnancy  If you are premenopausal and you may become pregnant, ask your health care provider about preconception counseling.  If you may become pregnant, take 400 to 800 micrograms (mcg) of folic acid  every day.  If you want to prevent pregnancy, talk to your health care provider about birth control (contraception). Osteoporosis and menopause  Osteoporosis is a disease in which the bones lose minerals and strength with aging. This can result in serious bone fractures. Your risk for osteoporosis can be identified using a bone density scan.  If you are 4 years of age or older, or if you are at risk for osteoporosis and fractures, ask your health care provider if you should be screened.  Ask your health care provider whether you should take a calcium or vitamin D supplement to lower your risk for osteoporosis.  Menopause may have certain physical symptoms and risks.  Hormone replacement therapy may reduce some of these symptoms and risks. Talk to your health care provider about whether hormone replacement therapy is right for you. Follow these instructions at home:  Schedule regular health, dental, and eye exams.  Stay current with your immunizations.  Do not use any tobacco products including cigarettes, chewing tobacco, or electronic cigarettes.  If you are pregnant, do not drink alcohol.  If you are breastfeeding, limit how much and how often you drink alcohol.  Limit alcohol intake to no more than 1 drink per day for nonpregnant women. One drink equals 12 ounces of beer, 5 ounces of wine, or 1 ounces of hard liquor.  Do not use street drugs.  Do not share needles.  Ask your health care provider for help if you need support  or information about quitting drugs.  Tell your health care provider if you often feel depressed.  Tell your health care provider if you have ever been abused or do not feel safe at home. This information is not intended to replace advice given to you by your health care provider. Make sure you discuss any questions you have with your health care provider. Document Released: 11/22/2010 Document Revised: 10/15/2015 Document Reviewed: 02/10/2015 Elsevier Interactive Patient Education  2017 [employer].

## 2016-07-23 LAB — RPR: RPR Ser Ql: NONREACTIVE

## 2016-07-23 LAB — HIV ANTIBODY (ROUTINE TESTING W REFLEX): HIV Screen 4th Generation wRfx: NONREACTIVE

## 2016-07-26 ENCOUNTER — Telehealth: Payer: Self-pay

## 2016-07-26 LAB — PAP IG, CT-NG, RFX HPV ASCU
Chlamydia, Nuc. Acid Amp: NEGATIVE
Gonococcus by Nucleic Acid Amp: NEGATIVE
PAP Smear Comment: 0

## 2016-07-26 NOTE — Telephone Encounter (Signed)
-----   Message from Margaretann LovelessJennifer M Burnette, [name] sent at 07/26/2016  8:26 AM EST ----- Pap is normal, GC/Chlamydia negative.  Will repeat in 3 years. Other STD testing was negative as well.

## 2016-07-26 NOTE — Telephone Encounter (Signed)
Patient advised as below.  

## 2016-08-18 ENCOUNTER — Telehealth: Payer: Self-pay

## 2016-08-18 ENCOUNTER — Encounter: Payer: Self-pay | Admitting: Physician Assistant

## 2016-08-18 ENCOUNTER — Ambulatory Visit (INDEPENDENT_AMBULATORY_CARE_PROVIDER_SITE_OTHER): Payer: Medicaid Other | Admitting: Physician Assistant

## 2016-08-18 VITALS — BP 128/82 | HR 94 | Temp 97.4°F | Resp 20 | Wt 298.0 lb

## 2016-08-18 DIAGNOSIS — J069 Acute upper respiratory infection, unspecified: Secondary | ICD-10-CM | POA: Diagnosis not present

## 2016-08-18 DIAGNOSIS — R609 Edema, unspecified: Secondary | ICD-10-CM | POA: Diagnosis not present

## 2016-08-18 DIAGNOSIS — J40 Bronchitis, not specified as acute or chronic: Secondary | ICD-10-CM | POA: Diagnosis not present

## 2016-08-18 MED ORDER — HYDROCODONE-HOMATROPINE 5-1.5 MG/5ML PO SYRP: 5.0000 mL | ORAL_SOLUTION | Freq: Three times a day (TID) | ORAL | 0 refills | Status: DC | PRN

## 2016-08-18 MED ORDER — FUROSEMIDE 20 MG PO TABS: 20.0000 mg | ORAL_TABLET | Freq: Two times a day (BID) | ORAL | 3 refills | Status: DC

## 2016-08-18 MED ORDER — PREDNISONE 10 MG (21) PO TBPK: ORAL_TABLET | ORAL | 0 refills | Status: DC

## 2016-08-18 MED ORDER — AZITHROMYCIN 250 MG PO TABS: ORAL_TABLET | ORAL | 1 refills | Status: DC

## 2016-08-18 NOTE — Patient Instructions (Signed)

## 2016-08-18 NOTE — Telephone Encounter (Signed)
Patient called c/o of cough, shortness of breath and wheezing x's 3 days. Patient reports symptoms are worse today. Patient has been using Albuterol inhaler reports mild symptom control. Patient also using OTC Alka seltzer.

## 2016-08-18 NOTE — Progress Notes (Signed)
Patient: [name] Female    DOB: April 25, 1986   31 y.o.   MRN: [id number] Visit Date: 08/18/2016  Today's Provider: Margaretann Loveless, [name]   Chief Complaint  Patient presents with  . Cough   Subjective:    HPI Patient here C/O of cough, shortness of breath and wheezing x's 3 days. Patient reports symptoms are worsening since last night. Patient has been using her inhaler reports mild relief. Patient reports taking OTC cold and cough medications, and reports no relief. Patient denies fever, vomiting or ear pain. Patient reports she swelling in both legs.     Allergies  Allergen Reactions  . Lactose Intolerance (Gi) Diarrhea  . Penicillins Hives    Has patient had a PCN reaction causing immediate rash, facial/tongue/throat swelling, SOB or lightheadedness with hypotension: Yes Has patient had a PCN reaction causing severe rash involving mucus membranes or skin necrosis: No Has patient had a PCN reaction that required hospitalization No Has patient had a PCN reaction occurring within the last 10 years: Yes If all of the above answers are "NO", then may proceed with Cephalosporin use.  Marland Kitchen Pineapple Other (See Comments)    Patient states this cuts my mouth and makes it bleed.     Current Outpatient Prescriptions:  .  albuterol (PROAIR HFA) 108 (90 Base) MCG/ACT inhaler, Inhale 1-2 puffs into the lungs every 6 (six) hours as needed for wheezing or shortness of breath., Disp: 18 g, Rfl: 3 .  furosemide (LASIX) 20 MG tablet, Take 1 tablet (20 mg total) by mouth daily., Disp: 30 tablet, Rfl: 3 .  montelukast (SINGULAIR) 10 MG tablet, Take 1 tablet (10 mg total) by mouth at bedtime., Disp: 30 tablet, Rfl: 3 .  potassium chloride (K-DUR) 10 MEQ tablet, Take one tablet PO with furosemide, Disp: 30 tablet, Rfl: 3 .  Vitamin D, Ergocalciferol, (DRISDOL) 50000 units CAPS capsule, Take 1 capsule (50,000 Units total) by mouth every 7 (seven) days., Disp: 12 capsule, Rfl: 1  Review  of Systems  Constitutional: Negative.  Negative for fatigue and fever.  HENT: Positive for congestion, rhinorrhea, sinus pain, sinus pressure, sneezing and sore throat. Negative for ear pain and trouble swallowing.   Respiratory: Positive for cough, shortness of breath and wheezing.   Cardiovascular: Negative for chest pain, palpitations and leg swelling.  Gastrointestinal: Negative for abdominal pain and nausea.  Neurological: Negative for dizziness and headaches.    Social History  Substance Use Topics  . Smoking status: Current Some Day Smoker    Types: Cigars  . Smokeless tobacco: Never Used     Comment: smokes cigars  . Alcohol use 0.0 oz/week     Comment: ocassional alcohol use; Mixed drinks once every 2-3 months   Objective:   BP 128/82 (BP Location: Left Arm, Patient Position: Sitting, Cuff Size: Large)   Pulse 94   Temp 97.4 F (36.3 C) (Oral)   Resp 20   Wt 298 lb (135.2 kg)   SpO2 97%   BMI 58.20 kg/m  Vitals:   08/18/16 1103  BP: 128/82  Pulse: 94  Resp: 20  Temp: 97.4 F (36.3 C)  TempSrc: Oral  SpO2: 97%  Weight: 298 lb (135.2 kg)     Physical Exam  Constitutional: She appears well-developed and well-nourished. No distress.  HENT:  Head: Normocephalic and atraumatic.  Right Ear: Hearing, external ear and ear canal normal. Tympanic membrane is bulging. A middle ear effusion (clear) is present.  Left Ear: Hearing, external ear and ear canal normal. Tympanic membrane is bulging. A middle ear effusion (clear) is present.  Nose: Nose normal. Right sinus exhibits no maxillary sinus tenderness and no frontal sinus tenderness. Left sinus exhibits no maxillary sinus tenderness and no frontal sinus tenderness.  Mouth/Throat: Uvula is midline, oropharynx is clear and moist and mucous membranes are normal. No oropharyngeal exudate, posterior oropharyngeal edema or posterior oropharyngeal erythema.  Eyes: Conjunctivae are normal. Pupils are equal, round, and reactive  to light. Right eye exhibits no discharge. Left eye exhibits no discharge. No scleral icterus.  Neck: Normal range of motion. Neck supple. No tracheal deviation present. No thyromegaly present.  Cardiovascular: Normal rate, regular rhythm and normal heart sounds.  Exam reveals no gallop and no friction rub.   No murmur heard. Pulmonary/Chest: Effort normal. No stridor. No respiratory distress. She has no decreased breath sounds. She has wheezes in the right middle field, the right lower field, the left middle field and the left lower field. She has no rhonchi. She has no rales.  Lymphadenopathy:    She has no cervical adenopathy.  Skin: Skin is warm and dry. She is not diaphoretic.  Vitals reviewed.      Assessment & Plan:     1. Bronchitis Worsening symptoms that have not responded to OTC medications. Will give zpak and prednisone as below since she has not responded to hre albuterol inhaler. Continue allergy medications. Stay well hydrated and get plenty of rest. Call if no symptom improvement or if symptoms worsen. - predniSONE (STERAPRED UNI-PAK 21 TAB) 10 MG (21) TBPK tablet; Take as directed on package directions  Dispense: 21 tablet; Refill: 0 - azithromycin (ZITHROMAX) 250 MG tablet; Take 2 tablets PO on day one, and one tablet PO daily thereafter until completed.  Dispense: 6 tablet; Refill: 1 - HYDROcodone-homatropine (HYCODAN) 5-1.5 MG/5ML syrup; Take 5 mLs by mouth every 8 (eight) hours as needed for cough.  Dispense: 120 mL; Refill: 0  2. Upper respiratory tract infection, unspecified type See above medical treatment plan. - predniSONE (STERAPRED UNI-PAK 21 TAB) 10 MG (21) TBPK tablet; Take as directed on package directions  Dispense: 21 tablet; Refill: 0 - azithromycin (ZITHROMAX) 250 MG tablet; Take 2 tablets PO on day one, and one tablet PO daily thereafter until completed.  Dispense: 6 tablet; Refill: 1  3. Body fluid retention Will increase to 2 tabs as below since she has  had more swelling. Suspect it is from sitting all day with legs in a dependent position. She is still not wearing compression stockings.  - furosemide (LASIX) 20 MG tablet; Take 1 tablet (20 mg total) by mouth 2 (two) times daily.  Dispense: 60 tablet; Refill: 3       Margaretann LovelessJennifer M Janeshia Ciliberto, [name]  [facility]

## 2016-09-02 ENCOUNTER — Encounter: Payer: Self-pay | Admitting: Physician Assistant

## 2016-09-02 ENCOUNTER — Ambulatory Visit (INDEPENDENT_AMBULATORY_CARE_PROVIDER_SITE_OTHER): Payer: Medicaid Other | Admitting: Physician Assistant

## 2016-09-02 VITALS — BP 120/80 | HR 80 | Temp 99.0°F | Resp 16 | Ht 60.0 in | Wt 287.7 lb

## 2016-09-02 DIAGNOSIS — Z713 Dietary counseling and surveillance: Secondary | ICD-10-CM

## 2016-09-02 DIAGNOSIS — Z6841 Body Mass Index (BMI) 40.0 and over, adult: Secondary | ICD-10-CM | POA: Diagnosis not present

## 2016-09-02 DIAGNOSIS — Z3042 Encounter for surveillance of injectable contraceptive: Secondary | ICD-10-CM | POA: Diagnosis not present

## 2016-09-02 MED ORDER — MEDROXYPROGESTERONE ACETATE 150 MG/ML IM SUSP
150.0000 mg | Freq: Once | INTRAMUSCULAR | Status: AC
  Administered 2016-09-02: 150 mg via INTRAMUSCULAR

## 2016-09-02 MED ORDER — PHENTERMINE HCL 37.5 MG PO TABS: 37.5000 mg | ORAL_TABLET | Freq: Every day | ORAL | 0 refills | Status: DC

## 2016-09-02 NOTE — Patient Instructions (Addendum)
Patient to return between June 27th -July 13th, 2018 for next depo injection.    Calorie Counting for Weight Loss Calories are units of energy. Your body needs a certain amount of calories from food to keep you going throughout the day. When you eat more calories than your body needs, your body stores the extra calories as fat. When you eat fewer calories than your body needs, your body burns fat to get the energy it needs. Calorie counting means keeping track of how many calories you eat and drink each day. Calorie counting can be helpful if you need to lose weight. If you make sure to eat fewer calories than your body needs, you should lose weight. Ask your health care provider what a healthy weight is for you. For calorie counting to work, you will need to eat the right number of calories in a day in order to lose a healthy amount of weight per week. A dietitian can help you determine how many calories you need in a day and will give you suggestions on how to reach your calorie goal.  A healthy amount of weight to lose per week is usually 1-2 lb (0.5-0.9 kg). This usually means that your daily calorie intake should be reduced by 500-750 calories.  Eating 1,200 - 1,500 calories per day can help most women lose weight.  Eating 1,500 - 1,800 calories per day can help most men lose weight. What is my plan? My goal is to have 1200 calories per day. If I have this many calories per day, I should lose around 1-2 pounds per week. What do I need to know about calorie counting? In order to meet your daily calorie goal, you will need to:  Find out how many calories are in each food you would like to eat. Try to do this before you eat.  Decide how much of the food you plan to eat.  Write down what you ate and how many calories it had. Doing this is called keeping a food log. To successfully lose weight, it is important to balance calorie counting with a healthy lifestyle that includes regular  activity. Aim for 150 minutes of moderate exercise (such as walking) or 75 minutes of vigorous exercise (such as running) each week. Where do I find calorie information?   The number of calories in a food can be found on a Nutrition Facts label. If a food does not have a Nutrition Facts label, try to look up the calories online or ask your dietitian for help. Remember that calories are listed per serving. If you choose to have more than one serving of a food, you will have to multiply the calories per serving by the amount of servings you plan to eat. For example, the label on a package of bread might say that a serving size is 1 slice and that there are 90 calories in a serving. If you eat 1 slice, you will have eaten 90 calories. If you eat 2 slices, you will have eaten 180 calories. How do I keep a food log? Immediately after each meal, record the following information in your food log:  What you ate. Don't forget to include toppings, sauces, and other extras on the food.  How much you ate. This can be measured in cups, ounces, or number of items.  How many calories each food and drink had.  The total number of calories in the meal. Keep your food log near you, such as in  a small notebook in your pocket, or use a mobile app or website. Some programs will calculate calories for you and show you how many calories you have left for the day to meet your goal. What are some calorie counting tips?  Use your calories on foods and drinks that will fill you up and not leave you hungry:  Some examples of foods that fill you up are nuts and nut butters, vegetables, lean proteins, and high-fiber foods like whole grains. High-fiber foods are foods with more than 5 g fiber per serving.  Drinks such as sodas, specialty coffee drinks, alcohol, and juices have a lot of calories, yet do not fill you up.  Eat nutritious foods and avoid empty calories. Empty calories are calories you get from foods or  beverages that do not have many vitamins or protein, such as candy, sweets, and soda. It is better to have a nutritious high-calorie food (such as an avocado) than a food with few nutrients (such as a bag of chips).  Know how many calories are in the foods you eat most often. This will help you calculate calorie counts faster.  Pay attention to calories in drinks. Low-calorie drinks include water and unsweetened drinks.  Pay attention to nutrition labels for "low fat" or "fat free" foods. These foods sometimes have the same amount of calories or more calories than the full fat versions. They also often have added sugar, starch, or salt, to make up for flavor that was removed with the fat.  Find a way of tracking calories that works for you. Get creative. Try different apps or programs if writing down calories does not work for you. What are some portion control tips?  Know how many calories are in a serving. This will help you know how many servings of a certain food you can have.  Use a measuring cup to measure serving sizes. You could also try weighing out portions on a kitchen scale. With time, you will be able to estimate serving sizes for some foods.  Take some time to put servings of different foods on your favorite plates, bowls, and cups so you know what a serving looks like.  Try not to eat straight from a bag or box. Doing this can lead to overeating. Put the amount you would like to eat in a cup or on a plate to make sure you are eating the right portion.  Use smaller plates, glasses, and bowls to prevent overeating.  Try not to multitask (for example, watch TV or use your computer) while eating. If it is time to eat, sit down at a table and enjoy your food. This will help you to know when you are full. It will also help you to be aware of what you are eating and how much you are eating. What are tips for following this plan? Reading food labels   Check the calorie count compared  to the serving size. The serving size may be smaller than what you are used to eating.  Check the source of the calories. Make sure the food you are eating is high in vitamins and protein and low in saturated and trans fats. Shopping   Read nutrition labels while you shop. This will help you make healthy decisions before you decide to purchase your food.  Make a grocery list and stick to it. Cooking   Try to cook your favorite foods in a healthier way. For example, try baking instead of frying.  Use low-fat dairy products. Meal planning   Use more fruits and vegetables. Half of your plate should be fruits and vegetables.  Include lean proteins like poultry and fish. How do I count calories when eating out?  Ask for smaller portion sizes.  Consider sharing an entree and sides instead of getting your own entree.  If you get your own entree, eat only half. Ask for a box at the beginning of your meal and put the rest of your entree in it so you are not tempted to eat it.  If calories are listed on the menu, choose the lower calorie options.  Choose dishes that include vegetables, fruits, whole grains, low-fat dairy products, and lean protein.  Choose items that are boiled, broiled, grilled, or steamed. Stay away from items that are buttered, battered, fried, or served with cream sauce. Items labeled "crispy" are usually fried, unless stated otherwise.  Choose water, low-fat milk, unsweetened iced tea, or other drinks without added sugar. If you want an alcoholic beverage, choose a lower calorie option such as a glass of wine or light beer.  Ask for dressings, sauces, and syrups on the side. These are usually high in calories, so you should limit the amount you eat.  If you want a salad, choose a garden salad and ask for grilled meats. Avoid extra toppings like bacon, cheese, or fried items. Ask for the dressing on the side, or ask for olive oil and vinegar or lemon to use as  dressing.  Estimate how many servings of a food you are given. For example, a serving of cooked rice is  cup or about the size of half a baseball. Knowing serving sizes will help you be aware of how much food you are eating at restaurants. The list below tells you how big or small some common portion sizes are based on everyday objects:  1 oz-4 stacked dice.  3 oz-1 deck of cards.  1 tsp-1 die.  1 Tbsp- a ping-pong ball.  2 Tbsp-1 ping-pong ball.   cup- baseball.  1 cup-1 baseball. Summary  Calorie counting means keeping track of how many calories you eat and drink each day. If you eat fewer calories than your body needs, you should lose weight.  A healthy amount of weight to lose per week is usually 1-2 lb (0.5-0.9 kg). This usually means reducing your daily calorie intake by 500-750 calories.  The number of calories in a food can be found on a Nutrition Facts label. If a food does not have a Nutrition Facts label, try to look up the calories online or ask your dietitian for help.  Use your calories on foods and drinks that will fill you up, and not on foods and drinks that will leave you hungry.  Use smaller plates, glasses, and bowls to prevent overeating. This information is not intended to replace advice given to you by your health care provider. Make sure you discuss any questions you have with your health care provider. Document Released: 05/09/2005 Document Revised: 04/08/2016 Document Reviewed: 04/08/2016 Elsevier Interactive Patient Education  2017 [employer].

## 2016-09-02 NOTE — Progress Notes (Signed)
Patient: [name] Female    DOB: Apr 13, 1986   31 y.o.   MRN: [id number] Visit Date: 09/02/2016  Today's Provider: Margaretann Loveless, [name]   Chief Complaint  Patient presents with  . Contraception  . Obesity   Subjective:    HPI Patient here today to get MedroxyProgesterone injection for birth control. Patient denies any side effects.   Patient is requesting Phentermine for weight loss. Patient reports that last year she took the medication and lost about 100 lbs. Patient reports that the [facility]. She has been 360 pounds at her heaviest and lost down to 246 pounds with phentermine. She stopped the medication, lifestyle changes and exercise when she started school because she "did not have time." She has gained back up to 287 pounds at current weight. She was 298 pounds at last visit and has been watching her diet and portion size and lost 11 pounds over the last month.  Current Exercise Habits: The patient does not participate in regular exercise at present   Wt Readings from Last 3 Encounters:  09/02/16 287 lb 11.2 oz (130.5 kg)  08/18/16 298 lb (135.2 kg)  07/22/16 283 lb 3.2 oz (128.5 kg)       Allergies  Allergen Reactions  . Lactose Intolerance (Gi) Diarrhea  . Penicillins Hives    Has patient had a PCN reaction causing immediate rash, facial/tongue/throat swelling, SOB or lightheadedness with hypotension: Yes Has patient had a PCN reaction causing severe rash involving mucus membranes or skin necrosis: No Has patient had a PCN reaction that required hospitalization No Has patient had a PCN reaction occurring within the last 10 years: Yes If all of the above answers are "NO", then may proceed with Cephalosporin use.  Marland Kitchen Pineapple Other (See Comments)    Patient states this cuts my mouth and makes it bleed.     Current Outpatient Prescriptions:  .  albuterol (PROAIR HFA) 108 (90 Base) MCG/ACT inhaler, Inhale 1-2  puffs into the lungs every 6 (six) hours as needed for wheezing or shortness of breath., Disp: 18 g, Rfl: 3 .  furosemide (LASIX) 20 MG tablet, Take 1 tablet (20 mg total) by mouth 2 (two) times daily., Disp: 60 tablet, Rfl: 3 .  montelukast (SINGULAIR) 10 MG tablet, Take 1 tablet (10 mg total) by mouth at bedtime., Disp: 30 tablet, Rfl: 3 .  potassium chloride (K-DUR) 10 MEQ tablet, Take one tablet PO with furosemide, Disp: 30 tablet, Rfl: 3 .  Vitamin D, Ergocalciferol, (DRISDOL) 50000 units CAPS capsule, Take 1 capsule (50,000 Units total) by mouth every 7 (seven) days., Disp: 12 capsule, Rfl: 1  Review of Systems  Constitutional: Negative.   Respiratory: Negative.   Cardiovascular: Negative.   Gastrointestinal: Negative.   Musculoskeletal: Negative.   Neurological: Negative.     Social History  Substance Use Topics  . Smoking status: Current Some Day Smoker    Types: Cigars  . Smokeless tobacco: Never Used     Comment: smokes cigars  . Alcohol use 0.0 oz/week     Comment: ocassional alcohol use; Mixed drinks once every 2-3 months   Objective:   BP 120/80 (BP Location: Left Arm, Patient Position: Sitting, Cuff Size: Large)   Pulse 80   Temp 99 F (37.2 C) (Oral)   Resp 16   Ht 5' (1.524 m)   Wt 287 lb 11.2 oz (130.5 kg)   BMI 56.19 kg/m  Vitals:  09/02/16 1000  BP: 120/80  Pulse: 80  Resp: 16  Temp: 99 F (37.2 C)  TempSrc: Oral  Weight: 287 lb 11.2 oz (130.5 kg)  Height: 5' (1.524 m)     Physical Exam  Constitutional: She appears well-developed and well-nourished. No distress.  Neck: Normal range of motion. Neck supple. No tracheal deviation present. No thyromegaly present.  Cardiovascular: Normal rate, regular rhythm and normal heart sounds.  Exam reveals no gallop and no friction rub.   No murmur heard. Pulmonary/Chest: Effort normal and breath sounds normal. No respiratory distress. She has no wheezes. She has no rales.  Lymphadenopathy:    She has no  cervical adenopathy.  Skin: She is not diaphoretic.  Psychiatric: She has a normal mood and affect. Her behavior is normal. Judgment and thought content normal.  Vitals reviewed.     Assessment & Plan:     1. Encounter for weight loss counseling Discussed adding a food diary and limiting to 1200-1400 calories daily. Discussed slowly adding exercise in as tolerated. Will add phentermine as below. I weill see her back in 4 weeks to recheck progress.  - phentermine (ADIPEX-P) 37.5 MG tablet; Take 1 tablet (37.5 mg total) by mouth daily before breakfast.  Dispense: 30 tablet; Refill: 0  2. Morbidly obese (HCC) See above medical treatment plan. - phentermine (ADIPEX-P) 37.5 MG tablet; Take 1 tablet (37.5 mg total) by mouth daily before breakfast.  Dispense: 30 tablet; Refill: 0  3. BMI 50.0-59.9, adult [facility]) See above medical treatment plan. - phentermine (ADIPEX-P) 37.5 MG tablet; Take 1 tablet (37.5 mg total) by mouth daily before breakfast.  Dispense: 30 tablet; Refill: 0  4. Surveillance for Depo-Provera contraception Injection given today and tolerated well.  - medroxyPROGESTERone (DEPO-PROVERA) injection 150 mg; Inject 1 mL (150 mg total) into the muscle once.       Margaretann Loveless, [name]  [facility]

## 2016-09-13 ENCOUNTER — Encounter: Payer: Medicaid Other | Admitting: Physician Assistant

## 2016-10-03 ENCOUNTER — Ambulatory Visit (INDEPENDENT_AMBULATORY_CARE_PROVIDER_SITE_OTHER): Payer: Managed Care, Other (non HMO) | Admitting: Physician Assistant

## 2016-10-03 ENCOUNTER — Encounter: Payer: Self-pay | Admitting: Physician Assistant

## 2016-10-03 DIAGNOSIS — Z6841 Body Mass Index (BMI) 40.0 and over, adult: Secondary | ICD-10-CM | POA: Diagnosis not present

## 2016-10-03 DIAGNOSIS — Z713 Dietary counseling and surveillance: Secondary | ICD-10-CM

## 2016-10-03 MED ORDER — PHENTERMINE HCL 37.5 MG PO TABS: 37.5000 mg | ORAL_TABLET | Freq: Every day | ORAL | 0 refills | Status: DC

## 2016-10-03 NOTE — Progress Notes (Signed)
Patient: [name] Female    DOB: 1985/06/09   31 y.o.   MRN: [id number] Visit Date: 10/03/2016  Today's Provider: Margaretann Loveless, [name]   Chief Complaint  Patient presents with  . Follow-up    Weight loss counseling   Subjective:    HPI Patient is here for weight loss counseling. She was advised to limit caloric intake to 1200-1400 Kcal/day. Slowly add exercise as tolerated and Phentermine. Per patient she is counting calories and writing them at home.she reports keeping her calories under 1500 kcal/day. She reports she has not been able to start exercise.  Wt Readings from Last 3 Encounters:  10/03/16 288 lb 3.2 oz (130.7 kg)  09/02/16 287 lb 11.2 oz (130.5 kg)  08/18/16 298 lb (135.2 kg)      Allergies  Allergen Reactions  . Lactose Intolerance (Gi) Diarrhea  . Penicillins Hives    Has patient had a PCN reaction causing immediate rash, facial/tongue/throat swelling, SOB or lightheadedness with hypotension: Yes Has patient had a PCN reaction causing severe rash involving mucus membranes or skin necrosis: No Has patient had a PCN reaction that required hospitalization No Has patient had a PCN reaction occurring within the last 10 years: Yes If all of the above answers are "NO", then may proceed with Cephalosporin use.  Marland Kitchen Pineapple Other (See Comments)    Patient states this cuts my mouth and makes it bleed.     Current Outpatient Prescriptions:  .  albuterol (PROAIR HFA) 108 (90 Base) MCG/ACT inhaler, Inhale 1-2 puffs into the lungs every 6 (six) hours as needed for wheezing or shortness of breath., Disp: 18 g, Rfl: 3 .  furosemide (LASIX) 20 MG tablet, Take 1 tablet (20 mg total) by mouth 2 (two) times daily., Disp: 60 tablet, Rfl: 3 .  montelukast (SINGULAIR) 10 MG tablet, Take 1 tablet (10 mg total) by mouth at bedtime., Disp: 30 tablet, Rfl: 3 .  phentermine (ADIPEX-P) 37.5 MG tablet, Take 1 tablet (37.5 mg total) by mouth daily before breakfast., Disp:  30 tablet, Rfl: 0 .  potassium chloride (K-DUR) 10 MEQ tablet, Take one tablet PO with furosemide, Disp: 30 tablet, Rfl: 3 .  Vitamin D, Ergocalciferol, (DRISDOL) 50000 units CAPS capsule, Take 1 capsule (50,000 Units total) by mouth every 7 (seven) days., Disp: 12 capsule, Rfl: 1  Review of Systems  Constitutional: Negative.   Respiratory: Negative.   Cardiovascular: Negative.   Gastrointestinal: Negative.   Neurological: Negative.     Social History  Substance Use Topics  . Smoking status: Current Some Day Smoker    Types: Cigars  . Smokeless tobacco: Never Used     Comment: smokes cigars  . Alcohol use 0.0 oz/week     Comment: ocassional alcohol use; Mixed drinks once every 2-3 months   Objective:   BP 122/78 (BP Location: Right Wrist, Patient Position: Sitting, Cuff Size: Normal)   Pulse (!) 108   Temp 99 F (37.2 C) (Oral)   Resp 16   Wt 288 lb 3.2 oz (130.7 kg)   BMI 56.29 kg/m    Physical Exam  Constitutional: She appears well-developed and well-nourished. No distress.  Morbidly obese  Neck: Normal range of motion. Neck supple. No tracheal deviation present. No thyromegaly present.  Cardiovascular: Normal rate, regular rhythm and normal heart sounds.  Exam reveals no gallop and no friction rub.   No murmur heard. Pulmonary/Chest: Effort normal and breath sounds normal. No respiratory distress. She  has no wheezes. She has no rales.  Lymphadenopathy:    She has no cervical adenopathy.  Skin: She is not diaphoretic.  Psychiatric: She has a normal mood and affect. Her behavior is normal. Judgment and thought content normal.  Vitals reviewed.       Assessment & Plan:     1. Morbidly obese (HCC) Not as good a change this month. Reports snacking more because she is working long hours. Discussed ways to snack healthy. She agrees to try these changes. Will see her back in one month for weight recheck.  - phentermine (ADIPEX-P) 37.5 MG tablet; Take 1 tablet (37.5 mg  total) by mouth daily before breakfast.  Dispense: 30 tablet; Refill: 0  2. BMI 50.0-59.9, adult [facility](HCC) See above medical treatment plan. - phentermine (ADIPEX-P) 37.5 MG tablet; Take 1 tablet (37.5 mg total) by mouth daily before breakfast.  Dispense: 30 tablet; Refill: 0  3. Encounter for weight loss counseling - phentermine (ADIPEX-P) 37.5 MG tablet; Take 1 tablet (37.5 mg total) by mouth daily before breakfast.  Dispense: 30 tablet; Refill: 0       Margaretann LovelessJennifer M Aamna Mallozzi, [name]  [facility]

## 2016-10-03 NOTE — Patient Instructions (Signed)
Mediterranean Diet A Mediterranean diet refers to food and lifestyle choices that are based on the traditions of countries located on the [place]. This way of eating has been shown to help prevent certain conditions and improve outcomes for people who have chronic diseases, like kidney disease and heart disease. What are tips for following this plan? Lifestyle  Cook and eat meals together with your family, when possible.  Drink enough fluid to keep your urine clear or pale yellow.  Be physically active every day. This includes:  Aerobic exercise like running or swimming.  Leisure activities like gardening, walking, or housework.  Get 7-8 hours of sleep each night.  If recommended by your health care provider, drink red wine in moderation. This means 1 glass a day for nonpregnant women and 2 glasses a day for men. A glass of wine equals 5 oz (150 mL). Reading food labels  Check the serving size of packaged foods. For foods such as rice and pasta, the serving size refers to the amount of cooked product, not dry.  Check the total fat in packaged foods. Avoid foods that have saturated fat or trans fats.  Check the ingredients list for added sugars, such as corn syrup. Shopping  At the grocery store, buy most of your food from the areas near the walls of the store. This includes:  Fresh fruits and vegetables (produce).  Grains, beans, nuts, and seeds. Some of these may be available in unpackaged forms or large amounts (in bulk).  Fresh seafood.  Poultry and eggs.  Low-fat dairy products.  Buy whole ingredients instead of prepackaged foods.  Buy fresh fruits and vegetables in-season from local farmers markets.  Buy frozen fruits and vegetables in resealable bags.  If you do not have access to quality fresh seafood, buy precooked frozen shrimp or canned fish, such as tuna, salmon, or sardines.  Buy small amounts of raw or cooked vegetables, salads, or olives from the  deli or salad bar at your store.  Stock your pantry so you always have certain foods on hand, such as olive oil, canned tuna, canned tomatoes, rice, pasta, and beans. Cooking  Cook foods with extra-virgin olive oil instead of using butter or other vegetable oils.  Have meat as a side dish, and have vegetables or grains as your main dish. This means having meat in small portions or adding small amounts of meat to foods like pasta or stew.  Use beans or vegetables instead of meat in common dishes like chili or lasagna.  Experiment with different cooking methods. Try roasting or broiling vegetables instead of steaming or sauteing them.  Add frozen vegetables to soups, stews, pasta, or rice.  Add nuts or seeds for added healthy fat at each meal. You can add these to yogurt, salads, or vegetable dishes.  Marinate fish or vegetables using olive oil, lemon juice, garlic, and fresh herbs. Meal planning  Plan to eat 1 vegetarian meal one day each week. Try to work up to 2 vegetarian meals, if possible.  Eat seafood 2 or more times a week.  Have healthy snacks readily available, such as:  Vegetable sticks with hummus.  Greek yogurt.  Fruit and nut trail mix.  Eat balanced meals throughout the week. This includes:  Fruit: 2-3 servings a day  Vegetables: 4-5 servings a day  Low-fat dairy: 2 servings a day  Fish, poultry, or lean meat: 1 serving a day  Beans and legumes: 2 or more servings a week  Nuts   and seeds: 1-2 servings a day  Whole grains: 6-8 servings a day  Extra-virgin olive oil: 3-4 servings a day  Limit red meat and sweets to only a few servings a month What are my food choices?  Mediterranean diet  Recommended  Grains: Whole-grain pasta. Brown rice. Bulgar wheat. Polenta. Couscous. Whole-wheat bread. Oatmeal. Quinoa.  Vegetables: Artichokes. Beets. Broccoli. Cabbage. Carrots. Eggplant. Green beans. Chard. Kale. Spinach. Onions. Leeks. Peas. Squash.  Tomatoes. Peppers. Radishes.  Fruits: Apples. Apricots. Avocado. Berries. Bananas. Cherries. Dates. Figs. Grapes. Lemons. Melon. Oranges. Peaches. Plums. Pomegranate.  Meats and other protein foods: Beans. Almonds. Sunflower seeds. Pine nuts. Peanuts. Cod. Salmon. Scallops. Shrimp. Tuna. Tilapia. Clams. Oysters. Eggs.  Dairy: Low-fat milk. Cheese. Greek yogurt.  Beverages: Water. Red wine. Herbal tea.  Fats and oils: Extra virgin olive oil. Avocado oil. Grape seed oil.  Sweets and desserts: Greek yogurt with honey. Baked apples. Poached pears. Trail mix.  Seasoning and other foods: Basil. Cilantro. Coriander. Cumin. Mint. Parsley. Sage. Rosemary. Tarragon. Garlic. Oregano. Thyme. Pepper. Balsalmic vinegar. Tahini. Hummus. Tomato sauce. Olives. Mushrooms.  Limit these  Grains: Prepackaged pasta or rice dishes. Prepackaged cereal with added sugar.  Vegetables: Deep fried potatoes (french fries).  Fruits: Fruit canned in syrup.  Meats and other protein foods: Beef. Pork. Lamb. Poultry with skin. Hot dogs. Bacon.  Dairy: Ice cream. Sour cream. Whole milk.  Beverages: Juice. Sugar-sweetened soft drinks. Beer. Liquor and spirits.  Fats and oils: Butter. Canola oil. Vegetable oil. Beef fat (tallow). Lard.  Sweets and desserts: Cookies. Cakes. Pies. Candy.  Seasoning and other foods: Mayonnaise. Premade sauces and marinades.  The items listed may not be a complete list. Talk with your dietitian about what dietary choices are right for you. Summary  The Mediterranean diet includes both food and lifestyle choices.  Eat a variety of fresh fruits and vegetables, beans, nuts, seeds, and whole grains.  Limit the amount of red meat and sweets that you eat.  Talk with your health care provider about whether it is safe for you to drink red wine in moderation. This means 1 glass a day for nonpregnant women and 2 glasses a day for men. A glass of wine equals 5 oz (150 mL). This information  is not intended to replace advice given to you by your health care provider. Make sure you discuss any questions you have with your health care provider. Document Released: 12/31/2015 Document Revised: 02/02/2016 Document Reviewed: 12/31/2015 Elsevier Interactive Patient Education  2017 Elsevier Inc.  

## 2016-10-19 ENCOUNTER — Telehealth: Payer: Self-pay | Admitting: Physician Assistant

## 2016-10-19 NOTE — Telephone Encounter (Signed)
Patient advised that the paper work was faxed to [employer]. Informed her that I had the originals if she needed them.  Thanks,  -Gayatri Teasdale

## 2016-10-19 NOTE — Telephone Encounter (Signed)
Pt wants to know if we have received a fax from last week.  It was an accomodation form for work.  Her call back is [phone] leave message if she does not answer.  Thanks Fortune Brandsteri

## 2016-10-19 NOTE — Telephone Encounter (Signed)
I have it but it has not been completed yet. I will complete during lunch. I was out of the office last week so unable to do it then.

## 2016-11-03 ENCOUNTER — Ambulatory Visit (INDEPENDENT_AMBULATORY_CARE_PROVIDER_SITE_OTHER): Payer: Managed Care, Other (non HMO) | Admitting: Physician Assistant

## 2016-11-03 ENCOUNTER — Encounter: Payer: Self-pay | Admitting: Physician Assistant

## 2016-11-03 VITALS — BP 128/82 | HR 115 | Temp 98.7°F | Wt 284.0 lb

## 2016-11-03 DIAGNOSIS — R609 Edema, unspecified: Secondary | ICD-10-CM

## 2016-11-03 DIAGNOSIS — J302 Other seasonal allergic rhinitis: Secondary | ICD-10-CM

## 2016-11-03 DIAGNOSIS — Z6841 Body Mass Index (BMI) 40.0 and over, adult: Secondary | ICD-10-CM

## 2016-11-03 DIAGNOSIS — E559 Vitamin D deficiency, unspecified: Secondary | ICD-10-CM

## 2016-11-03 DIAGNOSIS — N6320 Unspecified lump in the left breast, unspecified quadrant: Secondary | ICD-10-CM | POA: Diagnosis not present

## 2016-11-03 DIAGNOSIS — J4520 Mild intermittent asthma, uncomplicated: Secondary | ICD-10-CM | POA: Diagnosis not present

## 2016-11-03 DIAGNOSIS — Z713 Dietary counseling and surveillance: Secondary | ICD-10-CM

## 2016-11-03 MED ORDER — VITAMIN D (ERGOCALCIFEROL) 1.25 MG (50000 UNIT) PO CAPS: 50000.0000 [IU] | ORAL_CAPSULE | ORAL | 1 refills | Status: DC

## 2016-11-03 MED ORDER — FUROSEMIDE 20 MG PO TABS: 20.0000 mg | ORAL_TABLET | Freq: Two times a day (BID) | ORAL | 11 refills | Status: DC

## 2016-11-03 MED ORDER — MONTELUKAST SODIUM 10 MG PO TABS: 10.0000 mg | ORAL_TABLET | Freq: Every day | ORAL | 11 refills | Status: DC

## 2016-11-03 MED ORDER — ALBUTEROL SULFATE HFA 108 (90 BASE) MCG/ACT IN AERS: 1.0000 | INHALATION_SPRAY | Freq: Four times a day (QID) | RESPIRATORY_TRACT | 11 refills | Status: DC | PRN

## 2016-11-03 MED ORDER — POTASSIUM CHLORIDE ER 10 MEQ PO TBCR: EXTENDED_RELEASE_TABLET | ORAL | 11 refills | Status: DC

## 2016-11-03 MED ORDER — PHENTERMINE HCL 37.5 MG PO TABS: 37.5000 mg | ORAL_TABLET | Freq: Every day | ORAL | 2 refills | Status: DC

## 2016-11-03 NOTE — Progress Notes (Signed)
Patient: [name] Female    DOB: 11/20/1985   31 y.o.   MRN: [id number] Visit Date: 11/03/2016  Today's Provider: Margaretann LovelessJennifer M Clementina Mareno, [name]   Chief Complaint  Patient presents with  . Weight Check  . Breast Mass   Subjective:    HPI Patient is here today for 4 week follow-up weight loss counseling. Last weight was 288 lb. Today weight is 284 lbs. She reports exercising 2 times a week walking. Patient is requesting a refill on Phentermine 37.5 mg and all other maintenance medications in chart.  Patient would also like to discuss a left breast mass she noticed a couple weeks ago. Patient denies breast pain, nipple discharge, or skin changes.  Wt Readings from Last 3 Encounters:  11/03/16 284 lb (128.8 kg)  10/03/16 288 lb 3.2 oz (130.7 kg)  09/02/16 287 lb 11.2 oz (130.5 kg)     Previous Medications   No medications on file    Review of Systems  Constitutional: Negative.   Respiratory: Negative.   Cardiovascular: Negative.   Gastrointestinal: Negative.   Neurological: Negative.     Social History  Substance Use Topics  . Smoking status: Current Some Day Smoker    Types: Cigars  . Smokeless tobacco: Never Used     Comment: smokes cigars  . Alcohol use 0.0 oz/week     Comment: ocassional alcohol use; Mixed drinks once every 2-3 months   Objective:   BP 128/82 (BP Location: Right Arm, Patient Position: Sitting, Cuff Size: Large)   Pulse (!) 115   Temp 98.7 F (37.1 C) (Oral)   Wt 284 lb (128.8 kg)   SpO2 98%   BMI 55.46 kg/m   Physical Exam  Constitutional: She appears well-developed and well-nourished. No distress.  Morbidly obese  Neck: Normal range of motion. Neck supple.  Cardiovascular: Regular rhythm, normal heart sounds and normal pulses.  Tachycardia present.  Exam reveals no gallop and no friction rub.   No murmur heard. Pulmonary/Chest: Effort normal and breath sounds normal. No respiratory distress. She has no wheezes. She has no rales. Right  breast exhibits no inverted nipple, no mass, no nipple discharge, no skin change and no tenderness. Left breast exhibits mass. Left breast exhibits no inverted nipple, no nipple discharge, no skin change and no tenderness. Breasts are symmetrical.  Vitals reviewed.       Assessment & Plan:     1. Left breast mass Left breast mass noted as above. I suspect it to be a vein or possibly an inflamed duct. Will get imaging as below to further evaluate. She does have distant relative positive for breast cancer (maternal grandmother's sister passed from breast cancer). I will f/u pending results.  - US BREAST COMPLETE UNI LEFT INC AXILLA; Future  2. Encounter for weight loss counseling She has done better and making slow changes. Will continue phentermine as below for 3 more months. I will see her back in 3 months to recheck her progress. At that time we will consider doing a medication holiday for 1 month vs changing to Contrave or Saxenda which are safer alternatives for long term use for appetite suppression.  - phentermine (ADIPEX-P) 37.5 MG tablet; Take 1 tablet (37.5 mg total) by mouth daily before breakfast.  Dispense: 30 tablet; Refill: 2  3. Morbidly obese (HCC) See above medical treatment plan. - phentermine (ADIPEX-P) 37.5 MG tablet; Take 1 tablet (37.5 mg total) by mouth daily before breakfast.  Dispense: 30 tablet; Refill: 2  4. BMI 50.0-59.9, adult [facility]) See above medical treatment plan. - phentermine (ADIPEX-P) 37.5 MG tablet; Take 1 tablet (37.5 mg total) by mouth daily before breakfast.  Dispense: 30 tablet; Refill: 2  5. Body fluid retention Stable. Diagnosis pulled for medication refill. Continue current medical treatment plan. - potassium chloride (K-DUR) 10 MEQ tablet; Take one tablet PO with furosemide  Dispense: 30 tablet; Refill: 11 - furosemide (LASIX) 20 MG tablet; Take 1 tablet (20 mg total) by mouth 2 (two) times daily.  Dispense: 60 tablet; Refill: 11  6. Other  seasonal allergic rhinitis Stable. Diagnosis pulled for medication refill. Continue current medical treatment plan. - montelukast (SINGULAIR) 10 MG tablet; Take 1 tablet (10 mg total) by mouth at bedtime.  Dispense: 30 tablet; Refill: 11  7. RAD (reactive airway disease), mild intermittent, uncomplicated Stable. Diagnosis pulled for medication refill. Continue current medical treatment plan. - montelukast (SINGULAIR) 10 MG tablet; Take 1 tablet (10 mg total) by mouth at bedtime.  Dispense: 30 tablet; Refill: 11 - albuterol (PROAIR HFA) 108 (90 Base) MCG/ACT inhaler; Inhale 1-2 puffs into the lungs every 6 (six) hours as needed for wheezing or shortness of breath.  Dispense: 18 g; Refill: 11  8. Vitamin D deficiency Stable. Diagnosis pulled for medication refill. Continue current medical treatment plan. - Vitamin D, Ergocalciferol, (DRISDOL) 50000 units CAPS capsule; Take 1 capsule (50,000 Units total) by mouth every 7 (seven) days.  Dispense: 12 capsule; Refill: 1   Follow up: Return in about 3 months (around 02/03/2017) for weight .

## 2016-11-03 NOTE — Patient Instructions (Signed)

## 2016-11-03 NOTE — Addendum Note (Signed)
Addended by: Margaretann LovelessBURNETTE, Jeiry Birnbaum M on: 11/03/2016 09:22 AM   Modules accepted: Orders

## 2016-11-10 ENCOUNTER — Ambulatory Visit
Admission: RE | Admit: 2016-11-10 | Discharge: 2016-11-10 | Disposition: A | Discharge status: Home / Self Care | Payer: Managed Care, Other (non HMO) | Source: Ambulatory Visit | Attending: Physician Assistant | Admitting: Physician Assistant

## 2016-11-10 ENCOUNTER — Telehealth: Payer: Self-pay

## 2016-11-10 ENCOUNTER — Other Ambulatory Visit: Payer: Self-pay | Admitting: Physician Assistant

## 2016-11-10 DIAGNOSIS — N6320 Unspecified lump in the left breast, unspecified quadrant: Secondary | ICD-10-CM

## 2016-11-10 DIAGNOSIS — J302 Other seasonal allergic rhinitis: Secondary | ICD-10-CM

## 2016-11-10 DIAGNOSIS — J4520 Mild intermittent asthma, uncomplicated: Secondary | ICD-10-CM

## 2016-11-10 DIAGNOSIS — E876 Hypokalemia: Secondary | ICD-10-CM

## 2016-11-10 DIAGNOSIS — E559 Vitamin D deficiency, unspecified: Secondary | ICD-10-CM

## 2016-11-10 MED ORDER — VITAMIN D (ERGOCALCIFEROL) 1.25 MG (50000 UNIT) PO CAPS: 50000.0000 [IU] | ORAL_CAPSULE | ORAL | 1 refills | Status: DC

## 2016-11-10 MED ORDER — ALBUTEROL SULFATE HFA 108 (90 BASE) MCG/ACT IN AERS: 1.0000 | INHALATION_SPRAY | Freq: Four times a day (QID) | RESPIRATORY_TRACT | 11 refills | Status: DC | PRN

## 2016-11-10 MED ORDER — MONTELUKAST SODIUM 10 MG PO TABS: 10.0000 mg | ORAL_TABLET | Freq: Every day | ORAL | 1 refills | Status: DC

## 2016-11-10 MED ORDER — POTASSIUM CHLORIDE ER 10 MEQ PO TBCR: 10.0000 meq | EXTENDED_RELEASE_TABLET | Freq: Every day | ORAL | 1 refills | Status: DC

## 2016-11-10 NOTE — Telephone Encounter (Signed)
Patient advised as below. Patient verbalizes understanding and is in agreement with treatment plan.  

## 2016-11-10 NOTE — Telephone Encounter (Signed)
Last ov 11/03/16 Last filled 11/03/16 sent to [facility] Please review. Thank you. sd  

## 2016-11-10 NOTE — Progress Notes (Signed)
Patient has been advised but is still concerned b/c the radiologist said he felt a lump.  Patient would like to discuss it with you.

## 2016-11-10 NOTE — Telephone Encounter (Signed)
Pt called concerned about her Mammogram and U/S.  She just had it completed, and the radiologist said it was okay.  She is still concerned about the mass and is not sure what to do.  She does say it is tender to touch.    Contact number:  [phone]  Thanks,   -Vernona RiegerLaura

## 2016-11-10 NOTE — Telephone Encounter (Addendum)
[facility] faxed a request on the following medications. Thanks CC  Vitamin D, Ergocalciferol, (DRISDOL) 50000 units CAPS capsule   montelukast (SINGULAIR) 10 MG tablet  potassium chloride (K-DUR) 10 MEQ tablet   albuterol (PROAIR HFA) 108 (90 Base) MCG/ACT inhaler

## 2016-11-10 NOTE — Telephone Encounter (Signed)
There was nothing noted on mammogram or US. I am not sure what it could be because there was not even a cyst noted on the US. It is most likely muscular. She can come in for further evaluation if she wishes.

## 2016-11-14 ENCOUNTER — Other Ambulatory Visit: Payer: Self-pay | Admitting: Physician Assistant

## 2016-11-14 DIAGNOSIS — R609 Edema, unspecified: Secondary | ICD-10-CM

## 2016-11-14 MED ORDER — FUROSEMIDE 20 MG PO TABS: 20.0000 mg | ORAL_TABLET | Freq: Two times a day (BID) | ORAL | 1 refills | Status: DC

## 2016-11-14 NOTE — Telephone Encounter (Signed)
Last ov 11/03/16 Last filled 11/03/16 sent to [facility] Please review. Thank you. sd

## 2016-11-14 NOTE — Telephone Encounter (Signed)
[facility] faxed a request for the following medication. Thanks CC ° °furosemide (LASIX) 20 MG tablet  ° °

## 2016-12-02 ENCOUNTER — Encounter: Payer: Self-pay | Admitting: Physician Assistant

## 2016-12-02 ENCOUNTER — Ambulatory Visit (INDEPENDENT_AMBULATORY_CARE_PROVIDER_SITE_OTHER): Payer: Managed Care, Other (non HMO) | Admitting: Physician Assistant

## 2016-12-02 VITALS — BP 122/80 | HR 72 | Temp 98.2°F | Resp 16 | Wt 275.8 lb

## 2016-12-02 DIAGNOSIS — Z3042 Encounter for surveillance of injectable contraceptive: Secondary | ICD-10-CM | POA: Diagnosis not present

## 2016-12-02 DIAGNOSIS — Z713 Dietary counseling and surveillance: Secondary | ICD-10-CM

## 2016-12-02 MED ORDER — MEDROXYPROGESTERONE ACETATE 150 MG/ML IM SUSP
150.0000 mg | Freq: Once | INTRAMUSCULAR | Status: AC
  Administered 2016-12-02: 150 mg via INTRAMUSCULAR

## 2016-12-02 NOTE — Patient Instructions (Signed)
Exercising to Lose Weight Exercising can help you to lose weight. In order to lose weight through exercise, you need to do vigorous-intensity exercise. You can tell that you are exercising with vigorous intensity if you are breathing very hard and fast and cannot hold a conversation while exercising. Moderate-intensity exercise helps to maintain your current weight. You can tell that you are exercising at a moderate level if you have a higher heart rate and faster breathing, but you are still able to hold a conversation. How often should I exercise? Choose an activity that you enjoy and set realistic goals. Your health care provider can help you to make an activity plan that works for you. Exercise regularly as directed by your health care provider. This may include:  Doing resistance training twice each week, such as: ? Push-ups. ? Sit-ups. ? Lifting weights. ? Using resistance bands.  Doing a given intensity of exercise for a given amount of time. Choose from these options: ? 150 minutes of moderate-intensity exercise every week. ? 75 minutes of vigorous-intensity exercise every week. ? A mix of moderate-intensity and vigorous-intensity exercise every week.  Children, pregnant women, people who are out of shape, people who are overweight, and older adults may need to consult a health care provider for individual recommendations. If you have any sort of medical condition, be sure to consult your health care provider before starting a new exercise program. What are some activities that can help me to lose weight?  Walking at a rate of at least 4.5 miles an hour.  Jogging or running at a rate of 5 miles per hour.  Biking at a rate of at least 10 miles per hour.  Lap swimming.  Roller-skating or in-line skating.  Cross-country skiing.  Vigorous competitive sports, such as football, basketball, and soccer.  Jumping rope.  Aerobic dancing. How can I be more active in my day-to-day  activities?  Use the stairs instead of the elevator.  Take a walk during your lunch break.  If you drive, park your car farther away from work or school.  If you take public transportation, get off one stop early and walk the rest of the way.  Make all of your phone calls while standing up and walking around.  Get up, stretch, and walk around every 30 minutes throughout the day. What guidelines should I follow while exercising?  Do not exercise so much that you hurt yourself, feel dizzy, or get very short of breath.  Consult your health care provider prior to starting a new exercise program.  Wear comfortable clothes and shoes with good support.  Drink plenty of water while you exercise to prevent dehydration or heat stroke. Body water is lost during exercise and must be replaced.  Work out until you breathe faster and your heart beats faster. This information is not intended to replace advice given to you by your health care provider. Make sure you discuss any questions you have with your health care provider. Document Released: 06/11/2010 Document Revised: 10/15/2015 Document Reviewed: 10/10/2013 Elsevier Interactive Patient Education  2018 Elsevier Inc.  

## 2016-12-02 NOTE — Progress Notes (Signed)
Patient: [name] Female    DOB: 09/26/1985   31 y.o.   MRN: [id number] Visit Date: 12/02/2016  Today's Provider: Margaretann LovelessJennifer M Burnette, [name]   Chief Complaint  Patient presents with  . Follow-up  . Contraception   Subjective:    HPI  Follow up for weight  The patient was last seen for this 4 weeks ago. Changes made at last visit include no change. She reports she is now cooking suppers at home, not frying foods any longer, and trying to cut back portions. She is also exercising 2 days per week.  She reports excellent compliance with treatment. She feels that condition is Improved. She is not having side effects.   Current Exercise Habits: Home exercise routine;Structured exercise class, Type of exercise: strength training/weights;treadmill, Time (Minutes): 45, Frequency (Times/Week): 2, Weekly Exercise (Minutes/Week): 90, Intensity: Mild    Wt Readings from Last 3 Encounters:  12/02/16 275 lb 12.8 oz (125.1 kg)  11/03/16 284 lb (128.8 kg)  10/03/16 288 lb 3.2 oz (130.7 kg)   ------------------------------------------------------------------------------------  Patient here for contraception last depo injection was given on 09/02/16. Patient denies any symptoms reports tolerating injections well.     Allergies  Allergen Reactions  . Lactose Intolerance (Gi) Diarrhea  . Penicillins Hives    Has patient had a PCN reaction causing immediate rash, facial/tongue/throat swelling, SOB or lightheadedness with hypotension: Yes Has patient had a PCN reaction causing severe rash involving mucus membranes or skin necrosis: No Has patient had a PCN reaction that required hospitalization No Has patient had a PCN reaction occurring within the last 10 years: Yes If all of the above answers are "NO", then may proceed with Cephalosporin use.  Marland Kitchen. Pineapple Other (See Comments)    Patient states this cuts my mouth and makes it bleed.     Current Outpatient Prescriptions:  .   albuterol (PROAIR HFA) 108 (90 Base) MCG/ACT inhaler, Inhale 1-2 puffs into the lungs every 6 (six) hours as needed for wheezing or shortness of breath., Disp: 18 g, Rfl: 11 .  furosemide (LASIX) 20 MG tablet, Take 1 tablet (20 mg total) by mouth 2 (two) times daily., Disp: 180 tablet, Rfl: 1 .  montelukast (SINGULAIR) 10 MG tablet, Take 1 tablet (10 mg total) by mouth at bedtime., Disp: 90 tablet, Rfl: 1 .  phentermine (ADIPEX-P) 37.5 MG tablet, Take 1 tablet (37.5 mg total) by mouth daily before breakfast., Disp: 30 tablet, Rfl: 2 .  potassium chloride (K-DUR) 10 MEQ tablet, Take 1 tablet (10 mEq total) by mouth daily., Disp: 90 tablet, Rfl: 1 .  Vitamin D, Ergocalciferol, (DRISDOL) 50000 units CAPS capsule, Take 1 capsule (50,000 Units total) by mouth every 7 (seven) days., Disp: 12 capsule, Rfl: 1  Review of Systems  Constitutional: Negative.   Respiratory: Negative.   Cardiovascular: Negative.   Gastrointestinal: Negative.   Psychiatric/Behavioral: Negative.     Social History  Substance Use Topics  . Smoking status: Current Some Day Smoker    Types: Cigars  . Smokeless tobacco: Never Used     Comment: smokes cigars  . Alcohol use 0.0 oz/week     Comment: ocassional alcohol use; Mixed drinks once every 2-3 months   Objective:   BP 122/80 (BP Location: Left Arm, Patient Position: Sitting, Cuff Size: Large)   Pulse 72   Temp 98.2 F (36.8 C) (Oral)   Resp 16   Wt 275 lb 12.8 oz (125.1 kg)   SpO2  96%   BMI 53.86 kg/m  Vitals:   12/02/16 0815  BP: 122/80  Pulse: 72  Resp: 16  Temp: 98.2 F (36.8 C)  TempSrc: Oral  SpO2: 96%  Weight: 275 lb 12.8 oz (125.1 kg)     Physical Exam  Constitutional: She appears well-developed and well-nourished. No distress.  Neck: Normal range of motion. Neck supple.  Cardiovascular: Normal rate, regular rhythm and normal heart sounds.  Exam reveals no gallop and no friction rub.   No murmur heard. Pulmonary/Chest: Effort normal and  breath sounds normal. No respiratory distress. She has no wheezes. She has no rales.  Skin: She is not diaphoretic.  Vitals reviewed.       Assessment & Plan:     1. Encounter for weight loss counseling Patient continues to do well and has lost a total of 14 pounds since starting phentermine in 08/2016. Will continue phentermine and I will see her back in 3 months.   2. Surveillance for Depo-Provera contraception Depo given without complication. She is to return between 9/28-10/12/18 for her next injection. - medroxyPROGESTERone (DEPO-PROVERA) injection 150 mg; Inject 1 mL (150 mg total) into the muscle once.       Margaretann Loveless, [name]  [facility]

## 2016-12-14 ENCOUNTER — Ambulatory Visit (INDEPENDENT_AMBULATORY_CARE_PROVIDER_SITE_OTHER): Payer: Managed Care, Other (non HMO) | Admitting: Physician Assistant

## 2016-12-14 ENCOUNTER — Encounter: Payer: Self-pay | Admitting: Physician Assistant

## 2016-12-14 VITALS — BP 126/62 | Temp 98.2°F | Resp 16 | Wt 277.0 lb

## 2016-12-14 DIAGNOSIS — B379 Candidiasis, unspecified: Secondary | ICD-10-CM

## 2016-12-14 DIAGNOSIS — N3000 Acute cystitis without hematuria: Secondary | ICD-10-CM

## 2016-12-14 DIAGNOSIS — R300 Dysuria: Secondary | ICD-10-CM

## 2016-12-14 DIAGNOSIS — T3695XA Adverse effect of unspecified systemic antibiotic, initial encounter: Secondary | ICD-10-CM

## 2016-12-14 LAB — POCT URINALYSIS DIPSTICK
BILIRUBIN UA: NEGATIVE
Glucose, UA: NEGATIVE
KETONES UA: NEGATIVE
NITRITE UA: NEGATIVE
PH UA: 6 (ref 5.0–8.0)
PROTEIN UA: NEGATIVE
Spec Grav, UA: 1.02 (ref 1.010–1.025)
Urobilinogen, UA: 0.2 E.U./dL

## 2016-12-14 MED ORDER — SULFAMETHOXAZOLE-TRIMETHOPRIM 800-160 MG PO TABS: 1.0000 | ORAL_TABLET | Freq: Two times a day (BID) | ORAL | 0 refills | Status: DC

## 2016-12-14 MED ORDER — FLUCONAZOLE 150 MG PO TABS: 150.0000 mg | ORAL_TABLET | Freq: Once | ORAL | 0 refills | Status: AC

## 2016-12-14 NOTE — Patient Instructions (Signed)

## 2016-12-14 NOTE — Progress Notes (Signed)
Patient: [name] Female    DOB: 02/21/1986   31 y.o.   MRN: [id number] Visit Date: 12/14/2016  Today's Provider: Margaretann LovelessJennifer M Burnette, [name]   Chief Complaint  Patient presents with  . Vaginitis   Subjective:    HPI Patient comes in today c/o yeast infection. She reports that she has had symptoms for at least 3 days. She has not been taking anything OTC for symptoms. She reports that she has tried monistat in the past, but it seem to make the symptoms worse. She reports symptoms started after having intercourse without post-coital voiding. Then the next day she ate fish and did a vaginal douche following to lessen the "fishy" smell that comes through the urine after eating fish (reports this is an old wives tale that she was told to do by her grandmother after eating fish).    Allergies  Allergen Reactions  . Lactose Intolerance (Gi) Diarrhea  . Penicillins Hives    Has patient had a PCN reaction causing immediate rash, facial/tongue/throat swelling, SOB or lightheadedness with hypotension: Yes Has patient had a PCN reaction causing severe rash involving mucus membranes or skin necrosis: No Has patient had a PCN reaction that required hospitalization No Has patient had a PCN reaction occurring within the last 10 years: Yes If all of the above answers are "NO", then may proceed with Cephalosporin use.  Marland Kitchen. Pineapple Other (See Comments)    Patient states this cuts my mouth and makes it bleed.     Current Outpatient Prescriptions:  .  albuterol (PROAIR HFA) 108 (90 Base) MCG/ACT inhaler, Inhale 1-2 puffs into the lungs every 6 (six) hours as needed for wheezing or shortness of breath., Disp: 18 g, Rfl: 11 .  furosemide (LASIX) 20 MG tablet, Take 1 tablet (20 mg total) by mouth 2 (two) times daily., Disp: 180 tablet, Rfl: 1 .  montelukast (SINGULAIR) 10 MG tablet, Take 1 tablet (10 mg total) by mouth at bedtime., Disp: 90 tablet, Rfl: 1 .  phentermine (ADIPEX-P) 37.5 MG  tablet, Take 1 tablet (37.5 mg total) by mouth daily before breakfast., Disp: 30 tablet, Rfl: 2 .  potassium chloride (K-DUR) 10 MEQ tablet, Take 1 tablet (10 mEq total) by mouth daily., Disp: 90 tablet, Rfl: 1 .  Vitamin D, Ergocalciferol, (DRISDOL) 50000 units CAPS capsule, Take 1 capsule (50,000 Units total) by mouth every 7 (seven) days., Disp: 12 capsule, Rfl: 1  Review of Systems  Constitutional: Negative.   Respiratory: Negative.   Cardiovascular: Negative.   Gastrointestinal: Negative for abdominal pain.  Genitourinary: Positive for dysuria and frequency. Negative for flank pain, hematuria, pelvic pain, urgency, vaginal bleeding, vaginal discharge and vaginal pain.       Vaginal itching  Musculoskeletal: Negative for back pain.  Skin: Negative.     Social History  Substance Use Topics  . Smoking status: Current Some Day Smoker    Types: Cigars  . Smokeless tobacco: Never Used     Comment: smokes cigars  . Alcohol use 0.0 oz/week     Comment: ocassional alcohol use; Mixed drinks once every 2-3 months   Objective:   BP 126/62 (BP Location: Right Arm, Patient Position: Sitting, Cuff Size: Large)   Temp 98.2 F (36.8 C)   Resp 16   Wt 277 lb (125.6 kg)   BMI 54.10 kg/m  Vitals:   12/14/16 0813  BP: 126/62  Resp: 16  Temp: 98.2 F (36.8 C)  Weight: 277 lb (  125.6 kg)     Physical Exam  Constitutional: She is oriented to person, place, and time. She appears well-developed and well-nourished. No distress.  Cardiovascular: Normal rate, regular rhythm and normal heart sounds.  Exam reveals no gallop and no friction rub.   No murmur heard. Pulmonary/Chest: Effort normal and breath sounds normal. No respiratory distress. She has no wheezes. She has no rales.  Abdominal: Soft. Normal appearance and bowel sounds are normal. She exhibits no distension and no mass. There is no hepatosplenomegaly. There is no tenderness. There is no rebound, no guarding and no CVA tenderness.    Neurological: She is alert and oriented to person, place, and time.  Skin: Skin is warm and dry. She is not diaphoretic.  Vitals reviewed.       Assessment & Plan:     1. Acute cystitis without hematuria Worsening symptoms. UA positive. Will treat empirically with Bactrim as below. Continue to push fluids. Urine sent for culture. Will follow up pending C&S results. She is to call if symptoms do not improve or if they worsen.  - Urine Culture - sulfamethoxazole-trimethoprim (BACTRIM DS,SEPTRA DS) 800-160 MG tablet; Take 1 tablet by mouth 2 (two) times daily.  Dispense: 14 tablet; Refill: 0  2. Dysuria UA was positive. - POCT Urinalysis Dipstick  3. Antibiotic-induced yeast infection Suspect yeast infection from douching. Discussed cleansing habits and to not douche. One extra pill given for antibiotic induced yeast infection.  - fluconazole (DIFLUCAN) 150 MG tablet; Take 1 tablet (150 mg total) by mouth once. May repeat in 48-72 hrs if needed  Dispense: 2 tablet; Refill: 0       Margaretann LovelessJennifer M Burnette, [name]  [facility]

## 2016-12-15 ENCOUNTER — Encounter: Payer: Self-pay | Admitting: Emergency Medicine

## 2016-12-15 ENCOUNTER — Emergency Department
Admission: EM | Admit: 2016-12-15 | Discharge: 2016-12-15 | Disposition: A | Discharge status: Home / Self Care | Payer: Managed Care, Other (non HMO) | Attending: Student in an Organized Health Care Education/Training Program | Admitting: Student in an Organized Health Care Education/Training Program

## 2016-12-15 ENCOUNTER — Emergency Department: Payer: Managed Care, Other (non HMO)

## 2016-12-15 DIAGNOSIS — F17290 Nicotine dependence, other tobacco product, uncomplicated: Secondary | ICD-10-CM | POA: Diagnosis not present

## 2016-12-15 DIAGNOSIS — Z79899 Other long term (current) drug therapy: Secondary | ICD-10-CM | POA: Diagnosis not present

## 2016-12-15 DIAGNOSIS — R110 Nausea: Secondary | ICD-10-CM | POA: Insufficient documentation

## 2016-12-15 DIAGNOSIS — R079 Chest pain, unspecified: Secondary | ICD-10-CM | POA: Diagnosis present

## 2016-12-15 DIAGNOSIS — R0602 Shortness of breath: Secondary | ICD-10-CM | POA: Diagnosis not present

## 2016-12-15 DIAGNOSIS — Z87440 Personal history of urinary (tract) infections: Secondary | ICD-10-CM | POA: Insufficient documentation

## 2016-12-15 DIAGNOSIS — I10 Essential (primary) hypertension: Secondary | ICD-10-CM | POA: Diagnosis not present

## 2016-12-15 LAB — CBC
HCT: 37.8 % (ref 35.0–47.0)
Hemoglobin: 13.4 g/dL (ref 12.0–16.0)
MCH: 32.3 pg (ref 26.0–34.0)
MCHC: 35.3 g/dL (ref 32.0–36.0)
MCV: 91.3 fL (ref 80.0–100.0)
PLATELETS: 236 10*3/uL (ref 150–440)
RBC: 4.14 MIL/uL (ref 3.80–5.20)
RDW: 13.2 % (ref 11.5–14.5)
WBC: 7.5 10*3/uL (ref 3.6–11.0)

## 2016-12-15 LAB — BASIC METABOLIC PANEL
Anion gap: 10 (ref 5–15)
BUN: 10 mg/dL (ref 6–20)
CHLORIDE: 106 mmol/L (ref 101–111)
CO2: 24 mmol/L (ref 22–32)
CREATININE: 1.03 mg/dL — AB (ref 0.44–1.00)
Calcium: 9.8 mg/dL (ref 8.9–10.3)
GFR calc Af Amer: >60 mL/min (ref >60)
GFR calc non Af Amer: >60 mL/min (ref >60)
Glucose, Bld: 88 mg/dL (ref 65–99)
Potassium: 4.2 mmol/L (ref 3.5–5.1)
SODIUM: 140 mmol/L (ref 135–145)

## 2016-12-15 LAB — FIBRIN DERIVATIVES D-DIMER (ARMC ONLY): Fibrin derivatives D-dimer (ARMC): 316.36 (ref 0.00–499.00)

## 2016-12-15 LAB — TROPONIN I
Troponin I: <0.03 ng/mL (ref <0.03)
Troponin I: <0.03 ng/mL (ref <0.03)

## 2016-12-15 LAB — HCG, QUANTITATIVE, PREGNANCY: hCG, Beta Chain, Quant, S: <1 m[IU]/mL (ref <5)

## 2016-12-15 MED ORDER — TRAMADOL HCL 50 MG PO TABS: 50.0000 mg | ORAL_TABLET | Freq: Four times a day (QID) | ORAL | 0 refills | Status: DC | PRN

## 2016-12-15 MED ORDER — KETOROLAC TROMETHAMINE 30 MG/ML IJ SOLN
15.0000 mg | Freq: Once | INTRAMUSCULAR | Status: AC
Start: 2016-12-15 — End: 2016-12-15
  Administered 2016-12-15: 15 mg via INTRAVENOUS
  Filled 2016-12-15: qty 1

## 2016-12-15 MED ORDER — IPRATROPIUM-ALBUTEROL 0.5-2.5 (3) MG/3ML IN SOLN
3.0000 mL | Freq: Once | RESPIRATORY_TRACT | Status: AC
  Administered 2016-12-15: 3 mL via RESPIRATORY_TRACT

## 2016-12-15 MED ORDER — HYDROCODONE-ACETAMINOPHEN 5-325 MG PO TABS
1.0000 | ORAL_TABLET | Freq: Once | ORAL | Status: DC
  Filled 2016-12-15: qty 1

## 2016-12-15 MED ORDER — TRAMADOL HCL 50 MG PO TABS
50.0000 mg | ORAL_TABLET | Freq: Once | ORAL | Status: AC
  Administered 2016-12-15: 50 mg via ORAL
  Filled 2016-12-15: qty 1

## 2016-12-15 NOTE — ED Triage Notes (Signed)
Pt reports left side non radiating chest pain that began yesterday with nausea. Pt in no apparent distress in triage.

## 2016-12-15 NOTE — ED Provider Notes (Signed)
[facility] Emergency Department Provider Note    First MD Initiated Contact with Patient 12/15/16 1649     (approximate)  I have reviewed the triage vital signs and the nursing notes.   HISTORY  Chief Complaint Chest Pain    HPI [name] is a 31 y.o. female history of anxiety depression and hypertension and GERD presents with chest pain and pleuritic discomfort since her shortness of breath that started last night. Patient was also recently started on antibiotic and Diflucan for yeast infection and a urinary tract infection. States that she's never had this type of shortness of breath before. She is on Depo shots.  Denies any nausea or vomiting. No diarrhea. No previous history of DVT or PE. She does smoke and denies any productive cough.   Past Medical History:  Diagnosis Date  . Anxiety   . Depression   . Elevated blood pressure   . GERD (gastroesophageal reflux disease)   . Sleep apnea    Family History  Problem Relation Age of Onset  . Hypertension Maternal Grandmother    Past Surgical History:  Procedure Laterality Date  . CESAREAN SECTION     at 28 weeks for pre-eclampsia, pulmonary edema, gestational DM.  Marland Kitchen dilationand curettage of Uterus  2007   SAB  . Headache:2009     Headache wellness consult. during pregnancy   Patient Active Problem List   Diagnosis Date Noted  . Benign essential HTN 05/04/2016  . Acute frontal sinusitis 05/14/2015  . Pre-eclampsia, delivered 02/12/2015  . Abnormal vaginal Pap smear 02/05/2015  . Abortion, spontaneous 02/05/2015  . Anxiety 02/05/2015  . LBP (low back pain) 02/05/2015  . BMI 50.0-59.9, adult (HCC) 02/05/2015  . Fatigue 02/05/2015  . Fibrositis 02/05/2015  . Folliculitis 02/05/2015  . Acid reflux 02/05/2015  . Diabetes mellitus arising in pregnancy 02/05/2015  . High risk sexual behavior 02/05/2015  . 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/2015  . Morbidly obese (HCC) 02/05/2015  . Obstructive  sleep apnea 02/05/2015  . Vitamin D deficiency 02/05/2015  . Depression 02/05/2015  . Fibromyalgia 02/05/2015  . Abnormal C-reactive protein 10/24/2012  . Chronic LBP 08/13/2012  . Absence of menstruation 09/22/2009  . Migraine without status migrainosus 07/22/2008      Prior to Admission medications   Medication Sig Start Date End Date Taking? Authorizing Provider  albuterol (PROAIR HFA) 108 (90 Base) MCG/ACT inhaler Inhale 1-2 puffs into the lungs every 6 (six) hours as needed for wheezing or shortness of breath. 11/10/16   Burnette, Alessandra Bevels, [name]  furosemide (LASIX) 20 MG tablet Take 1 tablet (20 mg total) by mouth 2 (two) times daily. 11/14/16   Margaretann Loveless, [name]  montelukast (SINGULAIR) 10 MG tablet Take 1 tablet (10 mg total) by mouth at bedtime. 11/10/16   Margaretann Loveless, [name]  phentermine (ADIPEX-P) 37.5 MG tablet Take 1 tablet (37.5 mg total) by mouth daily before breakfast. 11/03/16   Margaretann Loveless, [name]  potassium chloride (K-DUR) 10 MEQ tablet Take 1 tablet (10 mEq total) by mouth daily. 11/10/16   Margaretann Loveless, [name]  sulfamethoxazole-trimethoprim (BACTRIM DS,SEPTRA DS) 800-160 MG tablet Take 1 tablet by mouth 2 (two) times daily. 12/14/16   Margaretann Loveless, [name]  traMADol (ULTRAM) 50 MG tablet Take 1 tablet (50 mg total) by mouth every 6 (six) hours as needed. 12/15/16 12/15/17  Willy Eddy, MD  Vitamin D, Ergocalciferol, (DRISDOL) 50000 units CAPS capsule Take 1 capsule (50,000 Units total) by mouth every  7 (seven) days. 11/10/16   Margaretann LovelessBurnette, Jennifer M, [name]    Allergies Lactose intolerance (gi); Penicillins; and Pineapple    Social History Social History  Substance Use Topics  . Smoking status: Current Some Day Smoker    Types: Cigars  . Smokeless tobacco: Never Used     Comment: smokes cigars  . Alcohol use 0.0 oz/week     Comment: ocassional alcohol use; Mixed drinks once every 2-3 months    Review of Systems Patient denies  headaches, rhinorrhea, blurry vision, numbness, shortness of breath, chest pain, edema, cough, abdominal pain, nausea, vomiting, diarrhea, dysuria, fevers, rashes or hallucinations unless otherwise stated above in HPI. ____________________________________________   PHYSICAL EXAM:  VITAL SIGNS: Vitals:   12/15/16 1710 12/15/16 1811  BP: (!) 133/100 140/89  Pulse: 90 97  Resp: 17 19  Temp:      Constitutional: Alert and oriented. Well appearing and in no acute distress. Eyes: Conjunctivae are normal.  Head: Atraumatic. Nose: No congestion/rhinnorhea. Mouth/Throat: Mucous membranes are moist.   Neck: No stridor. Painless ROM.  Cardiovascular: mildly tachycardic, regular rhythm. Grossly normal heart sounds.  Good peripheral circulation. Respiratory: Normal respiratory effort.  No retractions. Lungs with coarse breathsounds throughout Gastrointestinal: Soft and nontender. No distention. No abdominal bruits. No CVA tenderness. Musculoskeletal: No lower extremity tenderness nor edema.  No joint effusions. Neurologic:  Normal speech and language. No gross focal neurologic deficits are appreciated. No facial droop Skin:  Skin is warm, dry and intact. No rash noted. Psychiatric: Mood and affect are normal. Speech and behavior are normal.  ____________________________________________   LABS (all labs ordered are listed, but only abnormal results are displayed)  Results for orders placed or performed during the hospital encounter of 12/15/16 (from the past 24 hour(s))  Basic metabolic panel     Status: Abnormal   Collection Time: 12/15/16  3:16 PM  Result Value Ref Range   Sodium 140 135 - 145 mmol/L   Potassium 4.2 3.5 - 5.1 mmol/L   Chloride 106 101 - 111 mmol/L   CO2 24 22 - 32 mmol/L   Glucose, Bld 88 65 - 99 mg/dL   BUN 10 6 - 20 mg/dL   Creatinine, Ser 9.601.03 (H) 0.44 - 1.00 mg/dL   Calcium 9.8 8.9 - 45.410.3 mg/dL   GFR calc non Af Amer >60 >60 mL/min   GFR calc Af Amer >60 >60  mL/min   Anion gap 10 5 - 15  CBC     Status: None   Collection Time: 12/15/16  3:16 PM  Result Value Ref Range   WBC 7.5 3.6 - 11.0 K/uL   RBC 4.14 3.80 - 5.20 MIL/uL   Hemoglobin 13.4 12.0 - 16.0 g/dL   HCT 09.837.8 11.935.0 - 14.747.0 %   MCV 91.3 80.0 - 100.0 fL   MCH 32.3 26.0 - 34.0 pg   MCHC 35.3 32.0 - 36.0 g/dL   RDW 82.913.2 56.211.5 - 13.014.5 %   Platelets 236 150 - 440 K/uL  Troponin I     Status: None   Collection Time: 12/15/16  3:16 PM  Result Value Ref Range   Troponin I <0.03 <0.03 ng/mL  hCG, quantitative, pregnancy     Status: None   Collection Time: 12/15/16  3:16 PM  Result Value Ref Range   hCG, Beta Chain, Quant, S <1 <5 mIU/mL  Fibrin derivatives D-Dimer ([facility] only)     Status: None   Collection Time: 12/15/16  6:00 PM  Result Value Ref  Range   Fibrin derivatives D-dimer ([facility]) 316.36 0.00 - 499.00  Troponin I     Status: None   Collection Time: 12/15/16  7:34 PM  Result Value Ref Range   Troponin I <0.03 <0.03 ng/mL   ____________________________________________  EKG My review and personal interpretation at Time: 15:11   Indication: chest pain  Rate: 110  Rhythm: sinus Axis: normal Other: non specific st changes,  ____________________________________________  RADIOLOGY  I personally reviewed all radiographic images ordered to evaluate for the above acute complaints and reviewed radiology reports and findings.  These findings were personally discussed with the patient.  Please see medical record for radiology report.  ____________________________________________   PROCEDURES  Procedure(s) performed:  Procedures    Critical Care performed: no ____________________________________________   INITIAL IMPRESSION / ASSESSMENT AND PLAN / ED COURSE  Pertinent labs & imaging results that were available during my care of the patient were reviewed by me and considered in my medical decision making (see chart for details).  DDX: ACS, pericarditis, esophagitis,  boerhaaves, pe, dissection, pna, bronchitis, costochondritis   [name] is a 31 y.o. who presents to the ED with Chest pain as described above. Patient well-appearing. Does have some coarse breath sounds and pleuritic pain in nature therefore I am considering process such as bronchitis. Chest x-ray shows no evidence of heart failure or lobar pneumonia. EKG was nonspecific changes but possibly rate dependent given her sinus tachycardia. Her abdominal exam is soft and benign. Could have a component of reflux or gastritis. Do not suspect any component of cholecystitis or cholelithiasis. Blood work ordered for the above differential is reassuring. Patient is not pregnant. Renal function is appropriate. Given her tachycardia that she is on Depakote will further risk stratify for pulmonary embolism with a d-dimer. The patient is low risk by heart score will further risk stratify for any component of ACS with serial troponins.  Clinical Course as of Dec 15 2024  Thu Dec 15, 2016  1910 D-dimer is negative. Will further risk stratify for ACS with repeat troponin.  [PR]    Clinical Course User Index [PR] Willy Eddyobinson, Takela Varden, MD   ----------------------------------------- 8:26 PM on 12/15/2016 -----------------------------------------  Tachycardia improved. Repeat troponin is negative. Do not feel this clinically consistent with ACS. Probable component of bronchitis. Possible musculoskeletal pain. This point I do feel the patient is appropriate for discharge home. She has follow-up with cardiology and is appropriate for follow-up with primary care physician.  Have discussed with the patient and available family all diagnostics and treatments performed thus far and all questions were answered to the best of my ability. The patient demonstrates understanding and agreement with plan.   ____________________________________________   FINAL CLINICAL IMPRESSION(S) / ED DIAGNOSES  Final diagnoses:  Chest  pain, unspecified type      NEW MEDICATIONS STARTED DURING THIS VISIT:  New Prescriptions   TRAMADOL (ULTRAM) 50 MG TABLET    Take 1 tablet (50 mg total) by mouth every 6 (six) hours as needed.     Note:  This document was prepared using Dragon voice recognition software and may include unintentional dictation errors.    Willy Eddyobinson, Daleigh Pollinger, MD 12/15/16 2026

## 2016-12-16 LAB — URINE CULTURE

## 2017-01-09 ENCOUNTER — Telehealth: Payer: Self-pay

## 2017-01-09 NOTE — Telephone Encounter (Signed)
She should be able to get refill from pharmacy

## 2017-01-09 NOTE — Telephone Encounter (Signed)
Patient

## 2017-01-09 NOTE — Telephone Encounter (Signed)
Patient called saying that her boyfriend accidentally waste some of her phentermine pills and she has 25 pills left. She is requesting another refill. Will she be able to have this refilled being that you gave her a 3 month supply? She reports that the pharmacy already has the other Rx's on file. Please advise. Thanks!   She is also having insomnia due to back spasms. She scheduled an appt this week with you to discuss this.

## 2017-01-09 NOTE — Telephone Encounter (Signed)
Called patient and she will discuss this with Antony Contras when she comes into the office in 2 days.

## 2017-01-11 ENCOUNTER — Ambulatory Visit: Payer: Self-pay | Admitting: Physician Assistant

## 2017-01-11 ENCOUNTER — Ambulatory Visit (INDEPENDENT_AMBULATORY_CARE_PROVIDER_SITE_OTHER): Payer: Managed Care, Other (non HMO) | Admitting: Physician Assistant

## 2017-01-11 ENCOUNTER — Encounter: Payer: Self-pay | Admitting: Physician Assistant

## 2017-01-11 VITALS — BP 130/80 | HR 88 | Temp 98.3°F | Resp 16 | Wt 276.8 lb

## 2017-01-11 DIAGNOSIS — M62830 Muscle spasm of back: Secondary | ICD-10-CM | POA: Diagnosis not present

## 2017-01-11 MED ORDER — METHOCARBAMOL 500 MG PO TABS: 500.0000 mg | ORAL_TABLET | Freq: Three times a day (TID) | ORAL | 0 refills | Status: DC | PRN

## 2017-01-11 NOTE — Patient Instructions (Signed)
Muscle Cramps and Spasms Muscle cramps and spasms occur when a muscle or muscles tighten and you have no control over this tightening (involuntary muscle contraction). They are a common problem and can develop in any muscle. The most common place is in the calf muscles of the leg. Muscle cramps and muscle spasms are both involuntary muscle contractions, but there are some differences between the two:  Muscle cramps are painful. They come and go and may last a few seconds to 15 minutes. Muscle cramps are often more forceful and last longer than muscle spasms.  Muscle spasms may or may not be painful. They may also last just a few seconds or much longer.  Certain medical conditions, such as diabetes or Parkinson disease, can make it more likely to develop cramps or spasms. However, cramps or spasms are usually not caused by a serious underlying problem. Common causes include:  Overexertion.  Overuse from repetitive motions, or doing the same thing over and over.  Remaining in a certain position for a long period of time.  Improper preparation, form, or technique while playing a sport or doing an activity.  Dehydration.  Injury.  Side effects of some medicines.  Abnormally low levels of the salts and ions in your blood (electrolytes), especially potassium and calcium. This could happen if you are taking water pills (diuretics) or if you are pregnant.  In many cases, the cause of muscle cramps or spasms is unknown. Follow these instructions at home:  Stay well hydrated. Drink enough fluid to keep your urine clear or pale yellow.  Try massaging, stretching, and relaxing the affected muscle.  If directed, apply heat to tight or tense muscles as often as told by your health care provider. Use the heat source that your health care provider recommends, such as a moist heat pack or a heating pad. ? Place a towel between your skin and the heat source. ? Leave the heat on for 20-30  minutes. ? Remove the heat if your skin turns bright red. This is especially important if you are unable to feel pain, heat, or cold. You may have a greater risk of getting burned.  If directed, put ice on the affected area. This may help if you are sore or have pain after a cramp or spasm. ? Put ice in a plastic bag. ? Place a towel between your skin and the bag. ? Leavethe ice on for 20 minutes, 2-3 times a day.  Take over-the-counter and prescription medicines only as told by your health care provider.  Pay attention to any changes in your symptoms. Contact a health care provider if:  Your cramps or spasms get more severe or happen more often.  Your cramps or spasms do not improve over time. This information is not intended to replace advice given to you by your health care provider. Make sure you discuss any questions you have with your health care provider. Document Released: 10/29/2001 Document Revised: 06/10/2015 Document Reviewed: 02/10/2015 Elsevier Interactive Patient Education  2018 [employer] Methocarbamol tablets What is this medicine? METHOCARBAMOL (meth oh KAR ba mole) helps to relieve pain and stiffness in muscles caused by strains, sprains, or other injury to your muscles. This medicine may be used for other purposes; ask your health care provider or pharmacist if you have questions. COMMON BRAND NAME(S): Robaxin What should I tell my health care provider before I take this medicine? They need to know if you have any of these conditions: -kidney disease -seizures -an  unusual or allergic reaction to methocarbamol, other medicines, foods, dyes, or preservatives -pregnant or trying to get pregnant -breast-feeding How should I use this medicine? Take this medicine by mouth with a full glass of water. Follow the directions on the prescription label. Take your medicine at regular intervals. Do not take your medicine more often than directed. Talk to your pediatrician  regarding the use of this medicine in children. Special care may be needed. Overdosage: If you think you have taken too much of this medicine contact a poison control center or emergency room at once. NOTE: This medicine is only for you. Do not share this medicine with others. What if I miss a dose? If you miss a dose, take it as soon as you can. If it is almost time for your next dose, take only the next dose. Do not take double or extra doses. What may interact with this medicine? Do not take this medication with any of the following medicines: -narcotic medicines for cough This medicine may also interact with the following medications: -alcohol -antihistamines for allergy, cough and cold -certain medicines for anxiety or sleep -certain medicines for depression like amitriptyline, fluoxetine, sertraline -certain medicines for seizures like phenobarbital, primidone -cholinesterase inhibitors like neostigmine, ambenonium, and pyridostigmine bromide -general anesthetics like halothane, isoflurane, methoxyflurane, propofol -local anesthetics like lidocaine, pramoxine, tetracaine -medicines that relax muscles for surgery -narcotic medicines for pain -phenothiazines like chlorpromazine, mesoridazine, prochlorperazine, thioridazine This list may not describe all possible interactions. Give your health care provider a list of all the medicines, herbs, non-prescription drugs, or dietary supplements you use. Also tell them if you smoke, drink alcohol, or use illegal drugs. Some items may interact with your medicine. What should I watch for while using this medicine? Tell your doctor or health care professional if your symptoms do not start to get better or if they get worse. You may get drowsy or dizzy. Do not drive, use machinery, or do anything that needs mental alertness until you know how this medicine affects you. Do not stand or sit up quickly, especially if you are an older patient. This  reduces the risk of dizzy or fainting spells. Alcohol may interfere with the effect of this medicine. Avoid alcoholic drinks. If you are taking another medicine that also causes drowsiness, you may have more side effects. Give your health care provider a list of all medicines you use. Your doctor will tell you how much medicine to take. Do not take more medicine than directed. Call emergency for help if you have problems breathing or unusual sleepiness. What side effects may I notice from receiving this medicine? Side effects that you should report to your doctor or health care professional as soon as possible: -allergic reactions like skin rash, itching or hives, swelling of the face, lips, or tongue -breathing problems -confusion -seizures -unusually weak or tired Side effects that usually do not require medical attention (report to your doctor or health care professional if they continue or are bothersome): -dizziness -headache -metallic taste -tiredness -upset stomach This list may not describe all possible side effects. Call your doctor for medical advice about side effects. You may report side effects to FDA at 1-800-FDA-1088. Where should I keep my medicine? Keep out of the reach of children. Store at room temperature between 20 and 25 degrees C (68 and 77 degrees F). Keep container tightly closed. Throw away any unused medicine after the expiration date. NOTE: This sheet is a summary. It may not cover all  possible information. If you have questions about this medicine, talk to your doctor, pharmacist, or health care provider.  2018 Elsevier/Gold Standard (2015-02-17 13:11:54)

## 2017-01-11 NOTE — Progress Notes (Signed)
Patient: [name] Female    DOB: July 17, 1985   31 y.o.   MRN: [id number] Visit Date: 01/11/2017  Today's Provider: Margaretann Loveless, [name]   Chief Complaint  Patient presents with  . Back Pain   Subjective:    HPI Patient is here today with c/o back spasms. She does chronic back pain. She reports that the spasms are more frequent starting this past Thursday on her lower back. Aggravated by movement. Treatment tried: Tylenol Extra strength, back rub, heating pad.  She reports she has had chronic back pain for many years. She reports she has been told she has DDD. She has an xray of lumbar spine from 2012 in [facility] that was normal. Two years ago she had similar symptoms and reports Dr. Elease Hashimoto referred her to a chiropractor. The chiropractor took full spinal xrays and told her that she had DDD in lumbar and cervical spine, as well as loss of lordotic curvature in both. She denies any true radiculopathy, loss of bowel or bladder control, or weakness.     Allergies  Allergen Reactions  . Lactose Intolerance (Gi) Diarrhea  . Penicillins Hives    Has patient had a PCN reaction causing immediate rash, facial/tongue/throat swelling, SOB or lightheadedness with hypotension: Yes Has patient had a PCN reaction causing severe rash involving mucus membranes or skin necrosis: No Has patient had a PCN reaction that required hospitalization No Has patient had a PCN reaction occurring within the last 10 years: Yes If all of the above answers are "NO", then may proceed with Cephalosporin use.  Marland Kitchen Pineapple Other (See Comments)    Patient states this cuts my mouth and makes it bleed.     Current Outpatient Prescriptions:  .  albuterol (PROAIR HFA) 108 (90 Base) MCG/ACT inhaler, Inhale 1-2 puffs into the lungs every 6 (six) hours as needed for wheezing or shortness of breath., Disp: 18 g, Rfl: 11 .  furosemide (LASIX) 20 MG tablet, Take 1 tablet (20 mg total) by mouth 2 (two) times daily.,  Disp: 180 tablet, Rfl: 1 .  montelukast (SINGULAIR) 10 MG tablet, Take 1 tablet (10 mg total) by mouth at bedtime., Disp: 90 tablet, Rfl: 1 .  phentermine (ADIPEX-P) 37.5 MG tablet, Take 1 tablet (37.5 mg total) by mouth daily before breakfast., Disp: 30 tablet, Rfl: 2 .  potassium chloride (K-DUR) 10 MEQ tablet, Take 1 tablet (10 mEq total) by mouth daily., Disp: 90 tablet, Rfl: 1 .  Vitamin D, Ergocalciferol, (DRISDOL) 50000 units CAPS capsule, Take 1 capsule (50,000 Units total) by mouth every 7 (seven) days., Disp: 12 capsule, Rfl: 1 .  sulfamethoxazole-trimethoprim (BACTRIM DS,SEPTRA DS) 800-160 MG tablet, Take 1 tablet by mouth 2 (two) times daily. (Patient not taking: Reported on 01/11/2017), Disp: 14 tablet, Rfl: 0 .  traMADol (ULTRAM) 50 MG tablet, Take 1 tablet (50 mg total) by mouth every 6 (six) hours as needed. (Patient not taking: Reported on 01/11/2017), Disp: 20 tablet, Rfl: 0  Review of Systems  Constitutional: Negative.   Respiratory: Negative.   Cardiovascular: Positive for leg swelling (this is chronic). Negative for chest pain and palpitations.  Gastrointestinal: Negative.   Musculoskeletal: Positive for back pain. Negative for arthralgias, gait problem, joint swelling, myalgias, neck pain and neck stiffness.  Neurological: Negative.     Social History  Substance Use Topics  . Smoking status: Current Some Day Smoker    Types: Cigars  . Smokeless tobacco: Never Used  Comment: smokes cigars  . Alcohol use 0.0 oz/week     Comment: ocassional alcohol use; Mixed drinks once every 2-3 months   Objective:   BP 130/80 (BP Location: Left Wrist, Patient Position: Sitting, Cuff Size: Normal)   Pulse 88   Temp 98.3 F (36.8 C) (Oral)   Resp 16   Wt 276 lb 12.8 oz (125.6 kg)   BMI 52.30 kg/m     Physical Exam  Constitutional: She appears well-developed and well-nourished. No distress.  Morbidly obese  Neck: Normal range of motion. Neck supple.  Cardiovascular:  Normal rate, regular rhythm and normal heart sounds.  Exam reveals no gallop and no friction rub.   No murmur heard. Pulmonary/Chest: Effort normal and breath sounds normal. No respiratory distress. She has no wheezes. She has no rales.  Musculoskeletal:       Thoracic back: Normal.       Lumbar back: She exhibits tenderness (left sided) and spasm (left paraspinal). She exhibits normal range of motion and no bony tenderness.  Skin: She is not diaphoretic.  Vitals reviewed.     Assessment & Plan:     1. Muscle spasm of back Will give robaxin as below for muscle spasms. Advised patient to continue heating pad, massages. Add epsom salt soaks. Continue weight loss. She is to call if symptoms worsen. Patient has scheduled f/u in October.  - methocarbamol (ROBAXIN) 500 MG tablet; Take 1 tablet (500 mg total) by mouth every 8 (eight) hours as needed for muscle spasms.  Dispense: 30 tablet; Refill: 0       Margaretann Loveless, [name]  [facility]

## 2017-02-27 ENCOUNTER — Ambulatory Visit (INDEPENDENT_AMBULATORY_CARE_PROVIDER_SITE_OTHER): Payer: Managed Care, Other (non HMO) | Admitting: Physician Assistant

## 2017-02-27 ENCOUNTER — Encounter: Payer: Self-pay | Admitting: Physician Assistant

## 2017-02-27 VITALS — BP 110/80 | HR 60 | Temp 98.4°F | Resp 16 | Ht 61.0 in | Wt 279.0 lb

## 2017-02-27 DIAGNOSIS — Z23 Encounter for immunization: Secondary | ICD-10-CM

## 2017-02-27 DIAGNOSIS — Z713 Dietary counseling and surveillance: Secondary | ICD-10-CM

## 2017-02-27 DIAGNOSIS — Z3042 Encounter for surveillance of injectable contraceptive: Secondary | ICD-10-CM | POA: Diagnosis not present

## 2017-02-27 DIAGNOSIS — Z6841 Body Mass Index (BMI) 40.0 and over, adult: Secondary | ICD-10-CM | POA: Diagnosis not present

## 2017-02-27 MED ORDER — PHENTERMINE HCL 37.5 MG PO TABS: 37.5000 mg | ORAL_TABLET | Freq: Every day | ORAL | 2 refills | Status: DC

## 2017-02-27 MED ORDER — MEDROXYPROGESTERONE ACETATE 150 MG/ML IM SUSP
150.0000 mg | Freq: Once | INTRAMUSCULAR | Status: AC
  Administered 2017-02-27: 150 mg via INTRAMUSCULAR

## 2017-02-27 NOTE — Patient Instructions (Signed)

## 2017-02-27 NOTE — Progress Notes (Signed)
Patient: [name] Female    DOB: [birth year]   31 y.o.   MRN: [id number] Visit Date: 02/27/2017  Today's Provider: Margaretann Loveless, [name]   Chief Complaint  Patient presents with  . Follow-up  . Contraception  . Immunizations   Subjective:    HPI  Follow up for weight loss  The patient was last seen for this 3 months ago. Changes made at last visit include continue phentermine.  She reports fair compliance with treatment. Patient reports she has been off medication since August.  She feels that condition is Worse. She is not having side effects.  Patient reports she will restart Keto Diet.   Wt Readings from Last 3 Encounters:  02/27/17 279 lb (126.6 kg)  01/11/17 276 lb 12.8 oz (125.6 kg)  12/15/16 272 lb (123.4 kg)   ------------------------------------------------------------------------------------   Follow up for contraception  The patient was last seen for this 3 months ago. Changes made at last visit include no chagnes.  She reports excellent compliance with treatment. She is not having side effects.  ------------------------------------------------------------------------------------ Patient reports she has cut back on smoking and is trying to quit.     Allergies  Allergen Reactions  . Lactose Intolerance (Gi) Diarrhea  . Penicillins Hives    Has patient had a PCN reaction causing immediate rash, facial/tongue/throat swelling, SOB or lightheadedness with hypotension: Yes Has patient had a PCN reaction causing severe rash involving mucus membranes or skin necrosis: No Has patient had a PCN reaction that required hospitalization No Has patient had a PCN reaction occurring within the last 10 years: Yes If all of the above answers are "NO", then may proceed with Cephalosporin use.  Marland Kitchen Pineapple Other (See Comments)    Patient states this cuts my mouth and makes it bleed.     Current Outpatient Prescriptions:  .  albuterol (PROAIR HFA)  108 (90 Base) MCG/ACT inhaler, Inhale 1-2 puffs into the lungs every 6 (six) hours as needed for wheezing or shortness of breath., Disp: 18 g, Rfl: 11 .  furosemide (LASIX) 20 MG tablet, Take 1 tablet (20 mg total) by mouth 2 (two) times daily., Disp: 180 tablet, Rfl: 1 .  methocarbamol (ROBAXIN) 500 MG tablet, Take 1 tablet (500 mg total) by mouth every 8 (eight) hours as needed for muscle spasms., Disp: 30 tablet, Rfl: 0 .  montelukast (SINGULAIR) 10 MG tablet, Take 1 tablet (10 mg total) by mouth at bedtime., Disp: 90 tablet, Rfl: 1 .  phentermine (ADIPEX-P) 37.5 MG tablet, Take 1 tablet (37.5 mg total) by mouth daily before breakfast., Disp: 30 tablet, Rfl: 2 .  potassium chloride (K-DUR) 10 MEQ tablet, Take 1 tablet (10 mEq total) by mouth daily., Disp: 90 tablet, Rfl: 1 .  Vitamin D, Ergocalciferol, (DRISDOL) 50000 units CAPS capsule, Take 1 capsule (50,000 Units total) by mouth every 7 (seven) days., Disp: 12 capsule, Rfl: 1  Current Facility-Administered Medications:  .  medroxyPROGESTERone (DEPO-PROVERA) injection 150 mg, 150 mg, Intramuscular, Once, Margaretann Loveless, [name]  Review of Systems  Constitutional: Negative.   Respiratory: Negative.   Cardiovascular: Negative.   Gastrointestinal: Negative.   Neurological: Negative.   Psychiatric/Behavioral: Negative.     Social History  Substance Use Topics  . Smoking status: Current Some Day Smoker    Types: Cigars  . Smokeless tobacco: Never Used     Comment: smokes cigars  . Alcohol use 0.0 oz/week     Comment: ocassional alcohol use; Mixed  drinks once every 2-3 months   Objective:   BP 110/80 (BP Location: Left Arm, Patient Position: Sitting, Cuff Size: Large)   Pulse 60   Temp 98.4 F (36.9 C) (Oral)   Resp 16   Ht  (1.549 m)   Wt 279 lb (126.6 kg)   SpO2 97%   BMI 52.72 kg/m  Vitals:   02/27/17 0831  BP: 110/80  Pulse: 60  Resp: 16  Temp: 98.4 F (36.9 C)  TempSrc: Oral  SpO2: 97%  Weight: 279 lb  (126.6 kg)  Height:  (1.549 m)     Physical Exam  Constitutional: She appears well-developed and well-nourished. No distress.  Morbidly obese  Neck: Normal range of motion. Neck supple. No JVD present. No tracheal deviation present. No thyromegaly present.  Cardiovascular: Normal rate, regular rhythm and normal heart sounds.  Exam reveals no gallop and no friction rub.   No murmur heard. Pulmonary/Chest: Effort normal and breath sounds normal. No respiratory distress. She has no wheezes. She has no rales.  Lymphadenopathy:    She has no cervical adenopathy.  Skin: She is not diaphoretic.  Vitals reviewed.       Assessment & Plan:     1. Encounter for weight loss counseling Will restart phentermine as below. I will see her back in 3 months for weight recheck. Discussed in detail starting a food diary and trying to stay between 1000-1200 calories daily. Slowly increase physical activity as tolerated.  - phentermine (ADIPEX-P) 37.5 MG tablet; Take 1 tablet (37.5 mg total) by mouth daily before breakfast.  Dispense: 30 tablet; Refill: 2  2. Surveillance for Depo-Provera contraception Depo-provera injection given without issue. Return between 12/24 - 05/29/17 for next injection.  - medroxyPROGESTERone (DEPO-PROVERA) injection 150 mg; Inject 1 mL (150 mg total) into the muscle once.  3. Need for Tdap vaccination Tdap Vaccine given to patient without complications. Patient sat for 15 minutes after administration and was tolerated well without adverse effects. - Tdap vaccine greater than or equal to 7yo IM  4. Morbidly obese (HCC) See above medical treatment plan for #1. - phentermine (ADIPEX-P) 37.5 MG tablet; Take 1 tablet (37.5 mg total) by mouth daily before breakfast.  Dispense: 30 tablet; Refill: 2  5. BMI 50.0-59.9, adult [facility]) See above medical treatment plan for #1.  - phentermine (ADIPEX-P) 37.5 MG tablet; Take 1 tablet (37.5 mg total) by mouth daily before breakfast.   Dispense: 30 tablet; Refill: 2       Margaretann Loveless, [name]  [facility]

## 2017-03-29 ENCOUNTER — Encounter: Payer: Self-pay | Admitting: Physician Assistant

## 2017-03-29 ENCOUNTER — Ambulatory Visit: Payer: Managed Care, Other (non HMO) | Admitting: Physician Assistant

## 2017-03-29 VITALS — BP 120/70 | HR 60 | Temp 98.6°F | Resp 16 | Ht 61.0 in | Wt 273.0 lb

## 2017-03-29 DIAGNOSIS — K219 Gastro-esophageal reflux disease without esophagitis: Secondary | ICD-10-CM

## 2017-03-29 DIAGNOSIS — N6009 Solitary cyst of unspecified breast: Secondary | ICD-10-CM

## 2017-03-29 MED ORDER — OMEPRAZOLE 40 MG PO CPDR: 40.0000 mg | DELAYED_RELEASE_CAPSULE | Freq: Every day | ORAL | 3 refills | Status: DC

## 2017-03-29 NOTE — Progress Notes (Signed)
Patient: [name] Female    DOB: 01/03/1986   31 y.o.   MRN: [id number] Visit Date: 03/29/2017  Today's Provider: Margaretann LovelessJennifer M May Ozment, [name]   Chief Complaint  Patient presents with  . Breast Pain   Subjective:    HPI Patient here today C/O possible lumps in breast. Patient reports reports tenderness over right breast denies any redness, swelling or discharge. Has had diagnostic mammogram in June 2018 that was normal.  Patient reports nausea x's 2 weeks, pt reports taking Zantac prn for symptoms. Patient reports mild symptom improvement. Patient denies any vomiting or diarrhea. Patient reports symptoms are worse after eating.     Allergies  Allergen Reactions  . Lactose Intolerance (Gi) Diarrhea  . Penicillins Hives    Has patient had a PCN reaction causing immediate rash, facial/tongue/throat swelling, SOB or lightheadedness with hypotension: Yes Has patient had a PCN reaction causing severe rash involving mucus membranes or skin necrosis: No Has patient had a PCN reaction that required hospitalization No Has patient had a PCN reaction occurring within the last 10 years: Yes If all of the above answers are "NO", then may proceed with Cephalosporin use.  Marland Kitchen. Pineapple Other (See Comments)    Patient states this cuts my mouth and makes it bleed.     Current Outpatient Medications:  .  albuterol (PROAIR HFA) 108 (90 Base) MCG/ACT inhaler, Inhale 1-2 puffs into the lungs every 6 (six) hours as needed for wheezing or shortness of breath., Disp: 18 g, Rfl: 11 .  furosemide (LASIX) 20 MG tablet, Take 1 tablet (20 mg total) by mouth 2 (two) times daily., Disp: 180 tablet, Rfl: 1 .  montelukast (SINGULAIR) 10 MG tablet, Take 1 tablet (10 mg total) by mouth at bedtime., Disp: 90 tablet, Rfl: 1 .  phentermine (ADIPEX-P) 37.5 MG tablet, Take 1 tablet (37.5 mg total) by mouth daily before breakfast., Disp: 30 tablet, Rfl: 2 .  potassium chloride (K-DUR) 10 MEQ tablet, Take 1  tablet (10 mEq total) by mouth daily., Disp: 90 tablet, Rfl: 1 .  Vitamin D, Ergocalciferol, (DRISDOL) 50000 units CAPS capsule, Take 1 capsule (50,000 Units total) by mouth every 7 (seven) days., Disp: 12 capsule, Rfl: 1  Review of Systems  Constitutional: Negative.   Respiratory: Negative.   Cardiovascular: Negative.   Gastrointestinal: Positive for nausea. Negative for abdominal pain.  Genitourinary: Negative.   Musculoskeletal: Negative.   Neurological: Negative.     Social History   Tobacco Use  . Smoking status: Current Some Day Smoker    Types: Cigars  . Smokeless tobacco: Never Used  . Tobacco comment: smokes cigars  Substance Use Topics  . Alcohol use: Yes    Alcohol/week: 0.0 oz    Comment: ocassional alcohol use; Mixed drinks once every 2-3 months   Objective:   BP 120/70 (BP Location: Left Arm, Patient Position: Sitting, Cuff Size: Large)   Pulse 60   Temp 98.6 F (37 C) (Oral)   Resp 16   Ht 5\' 1"  (1.549 m)   Wt 273 lb (123.8 kg)   BMI 51.58 kg/m  Vitals:   03/29/17 1005  BP: 120/70  Pulse: 60  Resp: 16  Temp: 98.6 F (37 C)  TempSrc: Oral  Weight: 273 lb (123.8 kg)  Height: 5\' 1"  (1.549 m)     Physical Exam  Constitutional: She appears well-developed and well-nourished. No distress.  Neck: Normal range of motion. Neck supple. No JVD present. No tracheal  deviation present. No thyromegaly present.  Cardiovascular: Normal rate, regular rhythm and normal heart sounds. Exam reveals no gallop and no friction rub.  No murmur heard. Pulmonary/Chest: Effort normal and breath sounds normal. No respiratory distress. She has no wheezes. She has no rales. Right breast exhibits mass. Right breast exhibits no inverted nipple, no nipple discharge, no skin change and no tenderness. Left breast exhibits mass. Left breast exhibits no inverted nipple, no nipple discharge, no skin change and no tenderness. Breasts are asymmetrical (right breast larger; normal).     Abdominal: Soft. Bowel sounds are normal. She exhibits no distension. There is tenderness in the epigastric area.  Lymphadenopathy:    She has no cervical adenopathy.  Skin: She is not diaphoretic.  Vitals reviewed.      Assessment & Plan:     1. Gastroesophageal reflux disease, esophagitis presence not specified Worsening symptoms despite zantac. Will change to omeprazole as below. Trial x 2 weeks. If no improvement she is to call the office.  - omeprazole (PRILOSEC) 40 MG capsule; Take 1 capsule (40 mg total) daily by mouth.  Dispense: 30 capsule; Refill: 3  2. Cyst of breast, unspecified laterality Bilateral masses noted. Suspect cyst. Recommend monitoring and call if areas of concern due not improve. Patient did have negative mammogram in Jun 2018.       Margaretann LovelessJennifer M Juniper Cobey, [name]  [facility]

## 2017-03-29 NOTE — Patient Instructions (Signed)
Omeprazole tablets (OTC) What is this medicine? OMEPRAZOLE (oh ME pray zol) prevents the production of acid in the stomach. It is used to treat the symptoms of heartburn. You can buy this medicine without a prescription. This product is not for long-term use, unless otherwise directed by your doctor or health care professional. This medicine may be used for other purposes; ask your health care provider or pharmacist if you have questions. COMMON BRAND NAME(S): Prilosec OTC What should I tell my health care provider before I take this medicine? They need to know if you have any of these conditions: -black or bloody stools -chest pain -difficulty swallowing -have had heartburn for over 3 months -have heartburn with dizziness, lightheadedness or sweating -liver disease -lupus -stomach pain -unexplained weight loss -vomiting with blood -wheezing -an unusual or allergic reaction to omeprazole, other medicines, foods, dyes, or preservatives -pregnant or trying to get pregnant -breast-feeding How should I use this medicine? Take this medicine by mouth. Follow the directions on the product label. If you are taking this medicine without a prescription, take one tablet every day. Do not use for longer than 14 days or repeat a course of treatment more often than every 4 months unless directed by a doctor or healthcare professional. Take your dose at regular intervals every 24 hours. Swallow the tablet whole with a drink of water. Do not crush, break or chew. This medicine works best if taken on an empty stomach 30 minutes before breakfast. If you are using this medicine with the prescription of your doctor or healthcare professional, follow the directions you were given. Do not take your medicine more often than directed. Talk to your pediatrician regarding the use of this medicine in children. Special care may be needed. Overdosage: If you think you have taken too much of this medicine contact a poison  control center or emergency room at once. NOTE: This medicine is only for you. Do not share this medicine with others. What if I miss a dose? If you miss a dose, take it as soon as you can. If it is almost time for your next dose, take only that dose. Do not take double or extra doses. What may interact with this medicine? Do not take this medicine with any of the following medications: -atazanavir -clopidogrel -nelfinavir This medicine may also interact with the following medications: -ampicillin -certain medicines for anxiety or sleep -certain medicines that treat or prevent blood clots like warfarin -cyclosporine -diazepam -digoxin -disulfiram -iron salts -methotrexate -mycophenolate mofetil -phenytoin -prescription medicine for fungal or yeast infection like itraconazole, ketoconazole, voriconazole -saquinavir -tacrolimus This list may not describe all possible interactions. Give your health care provider a list of all the medicines, herbs, non-prescription drugs, or dietary supplements you use. Also tell them if you smoke, drink alcohol, or use illegal drugs. Some items may interact with your medicine. What should I watch for while using this medicine? It can take several days before your heartburn gets better. Check with your doctor or health care professional if your condition does not start to get better, or if it gets worse. Do not treat diarrhea with over the counter products. Contact your doctor if you have diarrhea that lasts more than 2 days or if it is severe and watery. Do not treat yourself for heartburn with this medicine for more than 14 days in a row. You should only use this medicine for a 2-week treatment period once every 4 months. If your symptoms return shortly after your   therapy is complete, or within the 4 month time frame, call your doctor or health care professional. What side effects may I notice from receiving this medicine? Side effects that you should  report to your doctor or health care professional as soon as possible: -allergic reactions like skin rash, itching or hives, swelling of the face, lips, or tongue -bone, muscle or joint pain -breathing problems -chest pain or chest tightness -dark yellow or brown urine -diarrhea -dizziness -fast, irregular heartbeat -feeling faint or lightheaded -fever or sore throat -muscle spasm -palpitations -rash on cheeks or arms that gets worse in the sun -redness, blistering, peeling or loosening of the skin, including inside the mouth -seizures -tremors -unusual bleeding or bruising -unusually weak or tired -yellowing of the eyes or skin Side effects that usually do not require medical attention (report to your doctor or health care professional if they continue or are bothersome): -constipation -dry mouth -headache -loose stools -nausea This list may not describe all possible side effects. Call your doctor for medical advice about side effects. You may report side effects to FDA at 1-800-FDA-1088. Where should I keep my medicine? Keep out of the reach of children. Store at room temperature between 20 and 25 degrees C (68 and 77 degrees F). Protect from light and moisture. Throw away any unused medicine after the expiration date. NOTE: This sheet is a summary. It may not cover all possible information. If you have questions about this medicine, talk to your doctor, pharmacist, or health care provider.  2018 Elsevier/Gold Standard (2015-06-11 13:06:31) Breast Tenderness Breast tenderness is a common problem for women of all ages. Breast tenderness may cause mild discomfort to severe pain. The pain usually comes and goes in association with your menstrual cycle, but it can be constant. Breast tenderness has many possible causes, including hormone changes and some medicines. Your health care provider may order tests, such as a mammogram or an ultrasound, to check for any unusual findings.  Having breast tenderness usually does not mean that you have breast cancer. Follow these instructions at home: Sometimes, reassurance that you do not have breast cancer is all that is needed. In general, follow these home care instructions: Managing pain and discomfort  If directed, apply ice to the area: ? Put ice in a plastic bag. ? Place a towel between your skin and the bag. ? Leave the ice on for 20 minutes, 2-3 times a day.  Make sure you are wearing a supportive bra, especially during exercise. You may also want to wear a supportive bra while sleeping if your breasts are very tender. Medicines  Take over-the-counter and prescription medicines only as told by your health care provider. If the cause of your pain is infection, you may be prescribed an antibiotic medicine.  If you were prescribed an antibiotic, take it as told by your health care provider. Do not stop taking the antibiotic even if you start to feel better. General instructions  Your health care provider may recommend that you reduce the amount of fat in your diet. You can do this by: ? Limiting fried foods. ? Cooking foods using methods, such as baking, boiling, grilling, and broiling.  Decrease the amount of caffeine in your diet. You can do this by drinking more water and choosing caffeine-free options.  Keep a log of the days and times when your breasts are most tender.  Ask your health care provider how to do breast exams at home. This will help you notice if  you have an unusual growth or lump. Contact a health care provider if:  Any part of your breast is hard, red, and hot to the touch. This may be a sign of infection.  You are not breastfeeding and you have fluid, especially blood or pus, coming out of your nipples.  You have a fever.  You have a new or painful lump in your breast that remains after your menstrual period ends.  Your pain does not improve or it gets worse.  Your pain is interfering  with your daily activities. This information is not intended to replace advice given to you by your health care provider. Make sure you discuss any questions you have with your health care provider. Document Released: 04/21/2008 Document Revised: 02/05/2016 Document Reviewed: 02/05/2016 Elsevier Interactive Patient Education  [employer]2018 Elsevier Inc.

## 2017-04-10 ENCOUNTER — Telehealth: Payer: Self-pay | Admitting: Physician Assistant

## 2017-04-10 NOTE — Telephone Encounter (Signed)
Please review. Thanks!  

## 2017-04-10 NOTE — Telephone Encounter (Signed)
Pt is requesting a letter stating she can work overtime.  Pt states the [employer] received info stating she could not work overtime.  WG#[phone]/HQCB#[phone]/MW

## 2017-04-11 ENCOUNTER — Encounter: Payer: Self-pay | Admitting: Physician Assistant

## 2017-04-11 NOTE — Telephone Encounter (Signed)
Letter has been printed  ?

## 2017-05-19 ENCOUNTER — Ambulatory Visit (INDEPENDENT_AMBULATORY_CARE_PROVIDER_SITE_OTHER): Payer: Managed Care, Other (non HMO) | Admitting: Physician Assistant

## 2017-05-19 ENCOUNTER — Encounter: Payer: Self-pay | Admitting: Physician Assistant

## 2017-05-19 VITALS — BP 126/72 | Temp 98.5°F | Resp 16 | Ht 61.0 in | Wt 279.0 lb

## 2017-05-19 DIAGNOSIS — Z6841 Body Mass Index (BMI) 40.0 and over, adult: Secondary | ICD-10-CM

## 2017-05-19 DIAGNOSIS — Z713 Dietary counseling and surveillance: Secondary | ICD-10-CM | POA: Diagnosis not present

## 2017-05-19 DIAGNOSIS — Z3042 Encounter for surveillance of injectable contraceptive: Secondary | ICD-10-CM | POA: Diagnosis not present

## 2017-05-19 MED ORDER — MEDROXYPROGESTERONE ACETATE 150 MG/ML IM SUSP: 150.0000 mg | Freq: Once | INTRAMUSCULAR | 0 refills | Status: DC

## 2017-05-19 MED ORDER — PHENTERMINE HCL 37.5 MG PO TABS: 37.5000 mg | ORAL_TABLET | Freq: Every day | ORAL | 0 refills | Status: DC

## 2017-05-19 NOTE — Progress Notes (Signed)
Patient: [name] Female    DOB: 02/05/1986   31 y.o.   MRN: [id number] Visit Date: 05/19/2017  Today's Provider: Margaretann LovelessJennifer M Lailani Tool, [name]   Chief Complaint  Patient presents with  . Weight Check   Subjective:    HPI  Patient is here for Weight loss counseling follow-up and Depo.   Weight, Follow up:  The patient was last seen for weight 2 1/2 months ago. Changes made since that visit include restart Phentermine.  She reports good compliance with treatment. She is not having side effects. Patient was 279 at last weight check, then lost to 273 pounds when she came for an acute visit one month later. She is now back up to 279 pounds.      Allergies  Allergen Reactions  . Lactose Intolerance (Gi) Diarrhea  . Penicillins Hives    Has patient had a PCN reaction causing immediate rash, facial/tongue/throat swelling, SOB or lightheadedness with hypotension: Yes Has patient had a PCN reaction causing severe rash involving mucus membranes or skin necrosis: No Has patient had a PCN reaction that required hospitalization No Has patient had a PCN reaction occurring within the last 10 years: Yes If all of the above answers are "NO", then may proceed with Cephalosporin use.  Marland Kitchen. Pineapple Other (See Comments)    Patient states this cuts my mouth and makes it bleed.     Current Outpatient Medications:  .  albuterol (PROAIR HFA) 108 (90 Base) MCG/ACT inhaler, Inhale 1-2 puffs into the lungs every 6 (six) hours as needed for wheezing or shortness of breath., Disp: 18 g, Rfl: 11 .  furosemide (LASIX) 20 MG tablet, Take 1 tablet (20 mg total) by mouth 2 (two) times daily., Disp: 180 tablet, Rfl: 1 .  montelukast (SINGULAIR) 10 MG tablet, Take 1 tablet (10 mg total) by mouth at bedtime., Disp: 90 tablet, Rfl: 1 .  omeprazole (PRILOSEC) 40 MG capsule, Take 1 capsule (40 mg total) daily by mouth., Disp: 30 capsule, Rfl: 3 .  phentermine (ADIPEX-P) 37.5 MG tablet, Take 1 tablet  (37.5 mg total) by mouth daily before breakfast., Disp: 30 tablet, Rfl: 2 .  potassium chloride (K-DUR) 10 MEQ tablet, Take 1 tablet (10 mEq total) by mouth daily., Disp: 90 tablet, Rfl: 1 .  Vitamin D, Ergocalciferol, (DRISDOL) 50000 units CAPS capsule, Take 1 capsule (50,000 Units total) by mouth every 7 (seven) days., Disp: 12 capsule, Rfl: 1  Review of Systems  Constitutional: Negative.   Respiratory: Negative.   Cardiovascular: Negative.   Gastrointestinal: Negative.   Psychiatric/Behavioral: Negative.     Social History   Tobacco Use  . Smoking status: Current Some Day Smoker    Types: Cigars  . Smokeless tobacco: Never Used  . Tobacco comment: smokes cigars  Substance Use Topics  . Alcohol use: Yes    Alcohol/week: 0.0 oz    Comment: ocassional alcohol use; Mixed drinks once every 2-3 months   Objective:   BP 126/72 (BP Location: Left Wrist, Patient Position: Sitting, Cuff Size: Large)   Temp 98.5 F (36.9 C)   Resp 16   Ht 5\' 1"  (1.549 m)   Wt 279 lb (126.6 kg)   BMI 52.72 kg/m    Physical Exam  Constitutional: She appears well-developed and well-nourished. No distress.  Neck: Normal range of motion. Neck supple.  Cardiovascular: Normal rate, regular rhythm and normal heart sounds. Exam reveals no gallop and no friction rub.  No murmur heard.  Pulmonary/Chest: Effort normal and breath sounds normal. No respiratory distress. She has no wheezes. She has no rales.  Skin: She is not diaphoretic.  Vitals reviewed.      Assessment & Plan:     1. Encounter for surveillance of injectable contraceptive Unfortunately we were out of depo-provera stock in the office today and unable to give this to her. I have sent in a one time Rx to her pharmacy that she will pick up this over the weekend and return Monday morning for her injection.  - medroxyPROGESTERone (DEPO-PROVERA) 150 MG/ML injection; Inject 1 mL (150 mg total) into the muscle once for 1 dose.  Dispense: 1 mL;  Refill: 0  2. Morbidly obese (HCC) Given for one more month and advised to start a food diary. She has been told this and has failed to do so. If she gains weight again next month she will have to try on her own. If she is able to lose weight on her own I will restart phentermine for a short while. She will also need her CPE in March 2019.  - phentermine (ADIPEX-P) 37.5 MG tablet; Take 1 tablet (37.5 mg total) by mouth daily before breakfast.  Dispense: 30 tablet; Refill: 0  3. BMI 50.0-59.9, adult [facility](HCC) See above medical treatment plan.  - phentermine (ADIPEX-P) 37.5 MG tablet; Take 1 tablet (37.5 mg total) by mouth daily before breakfast.  Dispense: 30 tablet; Refill: 0  4. Encounter for weight loss counseling See above medical treatment plan. - phentermine (ADIPEX-P) 37.5 MG tablet; Take 1 tablet (37.5 mg total) by mouth daily before breakfast.  Dispense: 30 tablet; Refill: 0       Margaretann LovelessJennifer M Dvontae Ruan, [name]  [facility]

## 2017-05-19 NOTE — Patient Instructions (Signed)

## 2017-05-22 ENCOUNTER — Ambulatory Visit (INDEPENDENT_AMBULATORY_CARE_PROVIDER_SITE_OTHER): Payer: Managed Care, Other (non HMO) | Admitting: Physician Assistant

## 2017-05-22 DIAGNOSIS — Z3042 Encounter for surveillance of injectable contraceptive: Secondary | ICD-10-CM

## 2017-05-22 MED ORDER — MEDROXYPROGESTERONE ACETATE 150 MG/ML IM SUSP
150.0000 mg | Freq: Once | INTRAMUSCULAR | Status: AC
Start: 2017-05-22 — End: 2017-05-22
  Administered 2017-05-22: 150 mg via INTRAMUSCULAR

## 2017-05-22 NOTE — Progress Notes (Signed)
Patient here for depo provera injection for contraception only. She tolerated well. She is to return in 3 months (between 3/18 -08/21/17).

## 2017-06-07 ENCOUNTER — Other Ambulatory Visit: Payer: Self-pay | Admitting: Physician Assistant

## 2017-06-07 DIAGNOSIS — J4520 Mild intermittent asthma, uncomplicated: Secondary | ICD-10-CM

## 2017-06-07 DIAGNOSIS — J302 Other seasonal allergic rhinitis: Secondary | ICD-10-CM

## 2017-06-07 DIAGNOSIS — R609 Edema, unspecified: Secondary | ICD-10-CM

## 2017-06-16 ENCOUNTER — Encounter: Payer: Self-pay | Admitting: Physician Assistant

## 2017-06-16 ENCOUNTER — Ambulatory Visit (INDEPENDENT_AMBULATORY_CARE_PROVIDER_SITE_OTHER): Payer: Managed Care, Other (non HMO) | Admitting: Physician Assistant

## 2017-06-16 VITALS — BP 128/76 | HR 70 | Temp 98.6°F | Resp 16 | Wt 278.0 lb

## 2017-06-16 DIAGNOSIS — B379 Candidiasis, unspecified: Secondary | ICD-10-CM

## 2017-06-16 DIAGNOSIS — Z713 Dietary counseling and surveillance: Secondary | ICD-10-CM

## 2017-06-16 MED ORDER — NYSTATIN 100000 UNIT/GM EX CREA: 1.0000 "application " | TOPICAL_CREAM | Freq: Two times a day (BID) | CUTANEOUS | 0 refills | Status: DC

## 2017-06-16 NOTE — Progress Notes (Signed)
Patient: [name] Female    DOB: 12/07/1985   32 y.o.   MRN: [id number] Visit Date: 06/16/2017  Today's Provider: Margaretann LovelessJennifer M Burnette, [name]   Chief Complaint  Patient presents with  . Weight Check   Subjective:    HPI  Patient comes in today for a 4 week follow up on her weight. No medication changes were made since her last visit. She has lost 1lb since she was seen last. Patient has been without phentermine through the month of December. This is the 1st time she has been able to go without phentermine and keep the weight off.   Patient also has a place on her bottom that is very uncomfortable. She reports that her symptoms are worse depending on the type of underwear she has on, when she wears a thong. She has noticed this for a little over 1 week.     Allergies  Allergen Reactions  . Lactose Intolerance (Gi) Diarrhea  . Penicillins Hives    Has patient had a PCN reaction causing immediate rash, facial/tongue/throat swelling, SOB or lightheadedness with hypotension: Yes Has patient had a PCN reaction causing severe rash involving mucus membranes or skin necrosis: No Has patient had a PCN reaction that required hospitalization No Has patient had a PCN reaction occurring within the last 10 years: Yes If all of the above answers are "NO", then may proceed with Cephalosporin use.  Marland Kitchen. Pineapple Other (See Comments)    Patient states this cuts my mouth and makes it bleed.     Current Outpatient Medications:  .  albuterol (PROAIR HFA) 108 (90 Base) MCG/ACT inhaler, Inhale 1-2 puffs into the lungs every 6 (six) hours as needed for wheezing or shortness of breath., Disp: 18 g, Rfl: 11 .  furosemide (LASIX) 20 MG tablet, TAKE 1 TABLET BY MOUTH TWO  TIMES DAILY, Disp: 180 tablet, Rfl: 1 .  montelukast (SINGULAIR) 10 MG tablet, TAKE 1 TABLET BY MOUTH AT  BEDTIME, Disp: 90 tablet, Rfl: 1 .  omeprazole (PRILOSEC) 40 MG capsule, Take 1 capsule (40 mg total) daily by mouth., Disp:  30 capsule, Rfl: 3 .  phentermine (ADIPEX-P) 37.5 MG tablet, Take 1 tablet (37.5 mg total) by mouth daily before breakfast., Disp: 30 tablet, Rfl: 0 .  potassium chloride (K-DUR) 10 MEQ tablet, Take 1 tablet (10 mEq total) by mouth daily., Disp: 90 tablet, Rfl: 1 .  Vitamin D, Ergocalciferol, (DRISDOL) 50000 units CAPS capsule, Take 1 capsule (50,000 Units total) by mouth every 7 (seven) days., Disp: 12 capsule, Rfl: 1 .  medroxyPROGESTERone (DEPO-PROVERA) 150 MG/ML injection, Inject 1 mL (150 mg total) into the muscle once for 1 dose., Disp: 1 mL, Rfl: 0  Review of Systems  Constitutional: Negative for activity change, appetite change, chills, diaphoresis, fatigue, fever and unexpected weight change.  Respiratory: Negative.   Cardiovascular: Negative.   Gastrointestinal: Negative.   Skin: Positive for rash.  Neurological: Negative for dizziness, weakness, light-headedness, numbness and headaches.  Psychiatric/Behavioral: Negative.     Social History   Tobacco Use  . Smoking status: Current Some Day Smoker    Types: Cigars  . Smokeless tobacco: Never Used  . Tobacco comment: smokes cigars  Substance Use Topics  . Alcohol use: Yes    Alcohol/week: 0.0 oz    Comment: ocassional alcohol use; Mixed drinks once every 2-3 months   Objective:   BP 128/76 (BP Location: Left Wrist, Patient Position: Sitting, Cuff Size: Large)  Pulse 70   Temp 98.6 F (37 C)   Resp 16   Wt 278 lb (126.1 kg)   BMI 52.53 kg/m  Vitals:   06/16/17 0910  BP: 128/76  Pulse: 70  Resp: 16  Temp: 98.6 F (37 C)  Weight: 278 lb (126.1 kg)     Physical Exam  Constitutional: She appears well-developed and well-nourished. No distress.  Neck: Normal range of motion. Neck supple.  Cardiovascular: Normal rate, regular rhythm and normal heart sounds. Exam reveals no gallop and no friction rub.  No murmur heard. Pulmonary/Chest: Effort normal and breath sounds normal. No respiratory distress. She has no  wheezes. She has no rales.  Genitourinary:     Skin: She is not diaphoretic.  Psychiatric: She has a normal mood and affect. Her behavior is normal. Judgment and thought content normal.  Vitals reviewed.       Assessment & Plan:     1. Weight loss counseling, encounter for Will do 2 more months on her own with calorie counting and exercise. When she returns on 07/24/17 for her CPE we will restart phentermine at that time since she will have had a 3 month holiday. We will then do phentermine for 3 more months and discontinue again for 3 months. Patient is in agreement.  2. Yeast infection Noted on gluteal cleft. Nystatin cream given as below. Advised to not wear constrictive underwear. - nystatin cream (MYCOSTATIN); Apply 1 application topically 2 (two) times daily.  Dispense: 30 g; Refill: 0       Margaretann Loveless, [name]  [facility]

## 2017-06-19 ENCOUNTER — Ambulatory Visit: Payer: Managed Care, Other (non HMO) | Admitting: Family Medicine

## 2017-06-19 ENCOUNTER — Encounter: Payer: Self-pay | Admitting: Family Medicine

## 2017-06-19 VITALS — BP 116/74 | HR 108 | Temp 98.5°F | Resp 16 | Wt 278.0 lb

## 2017-06-19 DIAGNOSIS — R6889 Other general symptoms and signs: Secondary | ICD-10-CM

## 2017-06-19 DIAGNOSIS — H6693 Otitis media, unspecified, bilateral: Secondary | ICD-10-CM | POA: Diagnosis not present

## 2017-06-19 LAB — POCT INFLUENZA A/B
INFLUENZA B, POC: NEGATIVE
Influenza A, POC: NEGATIVE

## 2017-06-19 MED ORDER — DOXYCYCLINE HYCLATE 100 MG PO TABS: 100.0000 mg | ORAL_TABLET | Freq: Two times a day (BID) | ORAL | 0 refills | Status: AC

## 2017-06-19 NOTE — Patient Instructions (Signed)

## 2017-06-19 NOTE — Progress Notes (Signed)
Patient: [name] Female    DOB: 28-Jul-1985   32 y.o.   MRN: [id number] Visit Date: 06/19/2017  Today's Provider: Shirlee Latch, MD   I, Joslyn Hy, CMA, am acting as scribe for Shirlee Latch, MD.  Chief Complaint  Patient presents with  . Fever   Subjective:    Fever   This is a new problem. The current episode started yesterday. The problem occurs daily. Progression since onset: afebrile in office; took APAP this morning. The maximum temperature noted was 100 to 100.9 F. The temperature was taken using an oral thermometer. Associated symptoms include congestion, ear pain (bilateral), headaches (and neck pain), muscle aches, nausea, sleepiness and a sore throat. Pertinent negatives include no abdominal pain, chest pain, coughing, diarrhea, rash, urinary pain, vomiting or wheezing. She has tried acetaminophen for the symptoms. The treatment provided moderate relief.  Risk factors: sick contacts (daughter had viral ear infection)       Allergies  Allergen Reactions  . Lactose Intolerance (Gi) Diarrhea  . Penicillins Hives    Has patient had a PCN reaction causing immediate rash, facial/tongue/throat swelling, SOB or lightheadedness with hypotension: Yes Has patient had a PCN reaction causing severe rash involving mucus membranes or skin necrosis: No Has patient had a PCN reaction that required hospitalization No Has patient had a PCN reaction occurring within the last 10 years: Yes If all of the above answers are "NO", then may proceed with Cephalosporin use.  Marland Kitchen Pineapple Other (See Comments)    Patient states this cuts my mouth and makes it bleed.     Current Outpatient Medications:  .  albuterol (PROAIR HFA) 108 (90 Base) MCG/ACT inhaler, Inhale 1-2 puffs into the lungs every 6 (six) hours as needed for wheezing or shortness of breath., Disp: 18 g, Rfl: 11 .  doxycycline (VIBRA-TABS) 100 MG tablet, Take 1 tablet (100 mg total) by mouth 2 (two) times  daily for 7 days., Disp: 14 tablet, Rfl: 0 .  furosemide (LASIX) 20 MG tablet, TAKE 1 TABLET BY MOUTH TWO  TIMES DAILY, Disp: 180 tablet, Rfl: 1 .  medroxyPROGESTERone (DEPO-PROVERA) 150 MG/ML injection, Inject 1 mL (150 mg total) into the muscle once for 1 dose., Disp: 1 mL, Rfl: 0 .  montelukast (SINGULAIR) 10 MG tablet, TAKE 1 TABLET BY MOUTH AT  BEDTIME, Disp: 90 tablet, Rfl: 1 .  nystatin cream (MYCOSTATIN), Apply 1 application topically 2 (two) times daily., Disp: 30 g, Rfl: 0 .  omeprazole (PRILOSEC) 40 MG capsule, Take 1 capsule (40 mg total) daily by mouth., Disp: 30 capsule, Rfl: 3 .  phentermine (ADIPEX-P) 37.5 MG tablet, Take 1 tablet (37.5 mg total) by mouth daily before breakfast., Disp: 30 tablet, Rfl: 0 .  potassium chloride (K-DUR) 10 MEQ tablet, Take 1 tablet (10 mEq total) by mouth daily., Disp: 90 tablet, Rfl: 1 .  Vitamin D, Ergocalciferol, (DRISDOL) 50000 units CAPS capsule, Take 1 capsule (50,000 Units total) by mouth every 7 (seven) days., Disp: 12 capsule, Rfl: 1  Review of Systems  Constitutional: Positive for fever.  HENT: Positive for congestion, ear pain (bilateral) and sore throat.   Respiratory: Negative for cough and wheezing.   Cardiovascular: Negative for chest pain.  Gastrointestinal: Positive for nausea. Negative for abdominal pain, diarrhea and vomiting.  Genitourinary: Negative for dysuria.  Skin: Negative for rash.  Neurological: Positive for headaches (and neck pain).    Social History   Tobacco Use  . Smoking status: Current  Some Day Smoker    Types: Cigars  . Smokeless tobacco: Never Used  . Tobacco comment: smokes cigars  Substance Use Topics  . Alcohol use: Yes    Alcohol/week: 0.0 oz    Comment: ocassional alcohol use; Mixed drinks once every 2-3 months   Objective:   BP 116/74 (BP Location: Left Wrist, Patient Position: Sitting, Cuff Size: Large)   Pulse (!) 108   Temp 98.5 F (36.9 C) (Oral)   Resp 16   Wt 278 lb (126.1 kg)   BMI  52.53 kg/m  Vitals:   06/19/17 1535  BP: 116/74  Pulse: (!) 108  Resp: 16  Temp: 98.5 F (36.9 C)  TempSrc: Oral  Weight: 278 lb (126.1 kg)     Physical Exam  Constitutional: She is oriented to person, place, and time. She appears well-developed and well-nourished. No distress.  HENT:  Head: Normocephalic and atraumatic.  Right Ear: External ear and ear canal normal. Tympanic membrane is erythematous and bulging.  Left Ear: External ear and ear canal normal. Tympanic membrane is erythematous and bulging.  Nose: Mucosal edema present.  Mouth/Throat: Uvula is midline and mucous membranes are normal. Posterior oropharyngeal erythema present. No oropharyngeal exudate or posterior oropharyngeal edema.  Eyes: Conjunctivae and EOM are normal. Pupils are equal, round, and reactive to light. No scleral icterus.  Neck: Neck supple. No thyromegaly present.  Cardiovascular: Normal rate, regular rhythm, normal heart sounds and intact distal pulses.  No murmur heard. Pulmonary/Chest: Effort normal and breath sounds normal. No respiratory distress. She has no wheezes. She has no rales.  Musculoskeletal: She exhibits no edema or deformity.  Lymphadenopathy:    She has no cervical adenopathy.  Neurological: She is alert and oriented to person, place, and time.  Psychiatric: She has a normal mood and affect. Her behavior is normal.  Vitals reviewed.   Results for orders placed or performed in visit on 06/19/17  POCT Influenza A/B  Result Value Ref Range   Influenza A, POC Negative Negative   Influenza B, POC Negative Negative       Assessment & Plan:     1. Bilateral otitis media, unspecified otitis media type - exam with bilateral otitis media - could be part of viral illness, but could also contribute to symptoms and fevers - given penicillin allergy patient does not want to take cephalosporins either, will treat with doxycycline  2. Flu-like symptoms - flu test negative -  discussed natural course, symptomatic management, and return precautions    Meds ordered this encounter  Medications  . doxycycline (VIBRA-TABS) 100 MG tablet    Sig: Take 1 tablet (100 mg total) by mouth 2 (two) times daily for 7 days.    Dispense:  14 tablet    Refill:  0     Return if symptoms worsen or fail to improve.   The entirety of the information documented in the History of Present Illness, Review of Systems and Physical Exam were personally obtained by me. Portions of this information were initially documented by Irving BurtonEmily Ratchford, CMA and reviewed by me for thoroughness and accuracy.    Erasmo DownerBacigalupo, Aimar Borghi M, MD, MPH [facility] 06/19/2017 4:57 PM

## 2017-07-24 ENCOUNTER — Encounter: Payer: Self-pay | Admitting: Physician Assistant

## 2017-07-24 ENCOUNTER — Ambulatory Visit (INDEPENDENT_AMBULATORY_CARE_PROVIDER_SITE_OTHER): Payer: Managed Care, Other (non HMO) | Admitting: Physician Assistant

## 2017-07-24 VITALS — BP 110/80 | HR 60 | Temp 98.7°F | Resp 16 | Ht 61.0 in | Wt 285.0 lb

## 2017-07-24 DIAGNOSIS — Z124 Encounter for screening for malignant neoplasm of cervix: Secondary | ICD-10-CM | POA: Diagnosis not present

## 2017-07-24 DIAGNOSIS — Z113 Encounter for screening for infections with a predominantly sexual mode of transmission: Secondary | ICD-10-CM

## 2017-07-24 DIAGNOSIS — Z0000 Encounter for general adult medical examination without abnormal findings: Secondary | ICD-10-CM

## 2017-07-24 DIAGNOSIS — F32A Depression, unspecified: Secondary | ICD-10-CM

## 2017-07-24 DIAGNOSIS — I10 Essential (primary) hypertension: Secondary | ICD-10-CM | POA: Diagnosis not present

## 2017-07-24 DIAGNOSIS — Z713 Dietary counseling and surveillance: Secondary | ICD-10-CM | POA: Diagnosis not present

## 2017-07-24 DIAGNOSIS — E559 Vitamin D deficiency, unspecified: Secondary | ICD-10-CM | POA: Diagnosis not present

## 2017-07-24 DIAGNOSIS — Z0001 Encounter for general adult medical examination with abnormal findings: Secondary | ICD-10-CM | POA: Diagnosis not present

## 2017-07-24 DIAGNOSIS — Z6841 Body Mass Index (BMI) 40.0 and over, adult: Secondary | ICD-10-CM | POA: Diagnosis not present

## 2017-07-24 DIAGNOSIS — F329 Major depressive disorder, single episode, unspecified: Secondary | ICD-10-CM | POA: Diagnosis not present

## 2017-07-24 DIAGNOSIS — F419 Anxiety disorder, unspecified: Secondary | ICD-10-CM

## 2017-07-24 DIAGNOSIS — R7309 Other abnormal glucose: Secondary | ICD-10-CM

## 2017-07-24 MED ORDER — PHENTERMINE HCL 37.5 MG PO TABS: 37.5000 mg | ORAL_TABLET | Freq: Every day | ORAL | 2 refills | Status: DC

## 2017-07-24 NOTE — Patient Instructions (Signed)

## 2017-07-24 NOTE — Progress Notes (Signed)
Patient: [name], Female    DOB: [birth year], 32 y.o.   MRN: [id number] Visit Date: 07/24/2017  Today's Provider: Margaretann Loveless, [name]   Chief Complaint  Patient presents with  . Annual Exam   Subjective:    Annual physical exam [name] is a 32 y.o. female who presents today for health maintenance and complete physical. She feels well. She reports exercising 3 days a week. She reports she is sleeping well.  07/22/16 CPE 07/22/16 Pap-neg ----------------------------------------------------------------- Patient reports she wants to restart phentermine, pt reports she has been off of medication since December, 2018. Patient reports she has gained weight and reports appetite has increased. Patient reports she has started exercising 3 days a week.   Review of Systems  Constitutional: Negative.   HENT: Negative.   Eyes: Negative.   Respiratory: Negative.   Cardiovascular: Positive for leg swelling.  Gastrointestinal: Positive for constipation and diarrhea.  Endocrine: Positive for polyphagia.  Genitourinary: Negative.   Musculoskeletal: Positive for back pain, gait problem, myalgias, neck pain and neck stiffness.  Skin: Negative.   Allergic/Immunologic: Positive for environmental allergies and food allergies.  Neurological: Positive for numbness and headaches.  Hematological: Negative.   Psychiatric/Behavioral: Negative.     Social History      She  reports that she has been smoking cigars.  she has never used smokeless tobacco. She reports that she drinks alcohol. She reports that she does not use drugs.       Social History   Socioeconomic History  . Marital status: Single    Spouse name: None  . Number of children: 1  . Years of education: high schoo  . Highest education level: None  Social Needs  . Financial resource strain: None  . Food insecurity - worry: None  . Food insecurity - inability: None  . Transportation needs - medical: None    . Transportation needs - non-medical: None  Occupational History  . None  Tobacco Use  . Smoking status: Current Some Day Smoker    Types: Cigars  . Smokeless tobacco: Never Used  . Tobacco comment: smokes cigars  Substance and Sexual Activity  . Alcohol use: Yes    Alcohol/week: 0.0 oz    Comment: ocassional alcohol use; Mixed drinks once every 2-3 months  . Drug use: No  . Sexual activity: Yes  Other Topics Concern  . None  Social History Narrative  . None    Past Medical History:  Diagnosis Date  . Anxiety   . Depression   . Elevated blood pressure   . GERD (gastroesophageal reflux disease)   . Sleep apnea      Patient Active Problem List   Diagnosis Date Noted  . Benign essential HTN 05/04/2016  . Acute frontal sinusitis 05/14/2015  . Pre-eclampsia, delivered 02/12/2015  . Abnormal vaginal Pap smear 02/05/2015  . Abortion, spontaneous 02/05/2015  . Anxiety 02/05/2015  . LBP (low back pain) 02/05/2015  . BMI 50.0-59.9, adult (HCC) 02/05/2015  . Fatigue 02/05/2015  . Fibrositis 02/05/2015  . Folliculitis 02/05/2015  . Acid reflux 02/05/2015  . History of diabetes mellitus arising in pregnancy 02/05/2015  . High risk sexual behavior 02/05/2015  . 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/2015  . Morbidly obese (HCC) 02/05/2015  . Obstructive sleep apnea 02/05/2015  . Vitamin D deficiency 02/05/2015  . Depression 02/05/2015  . Fibromyalgia 02/05/2015  . Abnormal C-reactive protein 10/24/2012  . Chronic LBP 08/13/2012  . Absence of menstruation 09/22/2009  .  Migraine without status migrainosus 07/22/2008    Past Surgical History:  Procedure Laterality Date  . CESAREAN SECTION     at 28 weeks for pre-eclampsia, pulmonary edema, gestational DM.  Marland Kitchen dilationand curettage of Uterus  2007   SAB  . Headache:2009     Headache wellness consult. during pregnancy    Family History        Family Status  Relation Name Status  . Mother  Alive       PUD'S  . Father  Alive        not part of life  . Daughter  Alive  . MGM  Alive       CAD, Glaucoma  . MGF  Deceased at age 59       died from prostate cancer; Alzheimer's  . PGM  Deceased at age 92       died of unknown causes  . PGF  Deceased       died of unknown causes        Her family history includes Hypertension in her maternal grandmother.      Allergies  Allergen Reactions  . Lactose Intolerance (Gi) Diarrhea  . Penicillins Hives    Has patient had a PCN reaction causing immediate rash, facial/tongue/throat swelling, SOB or lightheadedness with hypotension: Yes Has patient had a PCN reaction causing severe rash involving mucus membranes or skin necrosis: No Has patient had a PCN reaction that required hospitalization No Has patient had a PCN reaction occurring within the last 10 years: Yes If all of the above answers are "NO", then may proceed with Cephalosporin use.  Marland Kitchen Pineapple Other (See Comments)    Patient states this cuts my mouth and makes it bleed.     Current Outpatient Medications:  .  albuterol (PROAIR HFA) 108 (90 Base) MCG/ACT inhaler, Inhale 1-2 puffs into the lungs every 6 (six) hours as needed for wheezing or shortness of breath., Disp: 18 g, Rfl: 11 .  furosemide (LASIX) 20 MG tablet, TAKE 1 TABLET BY MOUTH TWO  TIMES DAILY, Disp: 180 tablet, Rfl: 1 .  montelukast (SINGULAIR) 10 MG tablet, TAKE 1 TABLET BY MOUTH AT  BEDTIME, Disp: 90 tablet, Rfl: 1 .  nystatin cream (MYCOSTATIN), Apply 1 application topically 2 (two) times daily., Disp: 30 g, Rfl: 0 .  omeprazole (PRILOSEC) 40 MG capsule, Take 1 capsule (40 mg total) daily by mouth., Disp: 30 capsule, Rfl: 3 .  potassium chloride (K-DUR) 10 MEQ tablet, Take 1 tablet (10 mEq total) by mouth daily., Disp: 90 tablet, Rfl: 1 .  Vitamin D, Ergocalciferol, (DRISDOL) 50000 units CAPS capsule, Take 1 capsule (50,000 Units total) by mouth every 7 (seven) days., Disp: 12 capsule, Rfl: 1 .  medroxyPROGESTERone (DEPO-PROVERA) 150 MG/ML  injection, Inject 1 mL (150 mg total) into the muscle once for 1 dose., Disp: 1 mL, Rfl: 0 .  phentermine (ADIPEX-P) 37.5 MG tablet, Take 1 tablet (37.5 mg total) by mouth daily before breakfast. (Patient not taking: Reported on 07/24/2017), Disp: 30 tablet, Rfl: 0   Patient Care Team: Margaretann Loveless, [name] as PCP - General (Family Medicine)      Objective:   Vitals: BP 110/80 (BP Location: Right Wrist, Patient Position: Sitting, Cuff Size: Large)   Pulse 60   Temp 98.7 F (37.1 C) (Oral)   Resp 16   Ht 5\' 1"  (1.549 m)   Wt 285 lb (129.3 kg)   BMI 53.85 kg/m    Vitals:  07/24/17 0914  BP: 110/80  Pulse: 60  Resp: 16  Temp: 98.7 F (37.1 C)  TempSrc: Oral  Weight: 285 lb (129.3 kg)  Height: 5\' 1"  (1.549 m)     Physical Exam  Constitutional: She is oriented to person, place, and time. She appears well-developed and well-nourished. No distress.  HENT:  Head: Normocephalic and atraumatic.  Right Ear: Hearing, tympanic membrane, external ear and ear canal normal.  Left Ear: Hearing, tympanic membrane, external ear and ear canal normal.  Nose: Nose normal.  Mouth/Throat: Uvula is midline, oropharynx is clear and moist and mucous membranes are normal. No oropharyngeal exudate.  Eyes: Conjunctivae and EOM are normal. Pupils are equal, round, and reactive to light. Right eye exhibits no discharge. Left eye exhibits no discharge. No scleral icterus.  Neck: Normal range of motion. Neck supple. No JVD present. Carotid bruit is not present. No tracheal deviation present. No thyromegaly present.  Cardiovascular: Normal rate, regular rhythm, normal heart sounds and intact distal pulses. Exam reveals no gallop and no friction rub.  No murmur heard. Pulmonary/Chest: Effort normal and breath sounds normal. No respiratory distress. She has no wheezes. She has no rales. She exhibits no tenderness. Right breast exhibits no inverted nipple, no mass, no nipple discharge, no skin change and no  tenderness. Left breast exhibits no inverted nipple, no mass, no nipple discharge, no skin change and no tenderness. Breasts are symmetrical.  Abdominal: Soft. Bowel sounds are normal. She exhibits no distension and no mass. There is no tenderness. There is no rebound and no guarding. Hernia confirmed negative in the right inguinal area and confirmed negative in the left inguinal area.  Genitourinary: Rectum normal, vagina normal and uterus normal. No breast swelling, tenderness, discharge or bleeding. Pelvic exam was performed with patient supine. There is no rash, tenderness, lesion or injury on the right labia. There is no rash, tenderness, lesion or injury on the left labia. Cervix exhibits no motion tenderness, no discharge and no friability. Right adnexum displays no mass, no tenderness and no fullness. Left adnexum displays no mass, no tenderness and no fullness. No erythema, tenderness or bleeding in the vagina. No signs of injury around the vagina. No vaginal discharge found.  Musculoskeletal: Normal range of motion. She exhibits no edema or tenderness.  Lymphadenopathy:    She has no cervical adenopathy.       Right: No inguinal adenopathy present.       Left: No inguinal adenopathy present.  Neurological: She is alert and oriented to person, place, and time. She has normal reflexes. No cranial nerve deficit. Coordination normal.  Skin: Skin is warm and dry. No rash noted. She is not diaphoretic.  Psychiatric: She has a normal mood and affect. Her behavior is normal. Judgment and thought content normal.  Vitals reviewed.    Depression Screen PHQ 2/9 Scores 07/24/2017 12/02/2016 07/22/2016 09/10/2015  PHQ - 2 Score 2 0 2 2  PHQ- 9 Score 6 1 2 7       Assessment & Plan:     Routine Health Maintenance and Physical Exam  Exercise Activities and Dietary recommendations Goals    None      Immunization History  Administered Date(s) Administered  . DTaP 03/07/1989  . HPV Quadrivalent  04/20/2011  . Hepatitis B 02/19/1996, 03/25/1996, 08/26/1996  . HiB (PRP-OMP) 12/29/1987  . IPV 03/07/1989  . Tdap 02/27/2017    Health Maintenance  Topic Date Due  . PNEUMOCOCCAL POLYSACCHARIDE VACCINE (1) 08/30/1986  . FOOT  EXAM  08/30/1994  . OPHTHALMOLOGY EXAM  08/30/1994  . URINE MICROALBUMIN  08/30/1994  . HEMOGLOBIN A1C  12/22/2016  . INFLUENZA VACCINE  01/28/2018 (Originally 12/21/2016)  . PAP SMEAR  07/23/2019  . TETANUS/TDAP  02/28/2027  . HIV Screening  Completed     Discussed health benefits of physical activity, and encouraged her to engage in regular exercise appropriate for her age and condition.    1. Annual physical exam Normal physical exam today. Will check labs as below and f/u pending lab results. If labs are stable and WNL she will not need to have these rechecked for one year at her next annual physical exam. She is to call the office in the meantime if she has any acute issue, questions or concerns. - Lipid Panel With LDL/HDL Ratio  2. Benign essential HTN Stable. Continue current medical treatment plan. Will check labs as below and f/u pending results. - CBC with Differential/Platelet - Comprehensive metabolic panel  3. Anxiety Stable. Will check labs as below and f/u pending results. - TSH  4. Morbidly obese (HCC) Stable. Diagnosis pulled for medication refill. Continue current medical treatment plan. - Hemoglobin A1c - phentermine (ADIPEX-P) 37.5 MG tablet; Take 1 tablet (37.5 mg total) by mouth daily before breakfast.  Dispense: 30 tablet; Refill: 2  5. Vitamin D deficiency Will check labs as below and f/u pending results. - VITAMIN D 25 Hydroxy (Vit-D Deficiency, Fractures)  6. Depression, unspecified depression type Stable.  7. Cervical cancer screening Pap collected today. Will send as below and f/u pending results. - Pap IG, CT/NG w/ reflex HPV when ASC-U  8. Screen for STD (sexually transmitted disease) Will check labs as below and  f/u pending results. - Pap IG, CT/NG w/ reflex HPV when ASC-U - HIV antibody (with reflex)  9. Elevated glucose Will check labs as below and f/u pending results. - Hemoglobin A1c  10. BMI 50.0-59.9, adult (HCC) Will restart phentermine as below as she has had 3 month holiday off medication. I will see her back in 3 months.  - phentermine (ADIPEX-P) 37.5 MG tablet; Take 1 tablet (37.5 mg total) by mouth daily before breakfast.  Dispense: 30 tablet; Refill: 2  11. Encounter for weight loss counseling See above medical treatment plan. - phentermine (ADIPEX-P) 37.5 MG tablet; Take 1 tablet (37.5 mg total) by mouth daily before breakfast.  Dispense: 30 tablet; Refill: 2  --------------------------------------------------------------------    Margaretann LovelessJennifer M Jermine Bibbee, [name]  [facility]

## 2017-07-25 ENCOUNTER — Telehealth: Payer: Self-pay | Admitting: Physician Assistant

## 2017-07-25 LAB — HEMOGLOBIN A1C
ESTIMATED AVERAGE GLUCOSE: 94 mg/dL
Hgb A1c MFr Bld: 4.9 % (ref 4.8–5.6)

## 2017-07-25 LAB — CBC WITH DIFFERENTIAL/PLATELET
Basophils Absolute: 0 10*3/uL (ref 0.0–0.2)
Basos: 1 %
EOS (ABSOLUTE): 0.1 10*3/uL (ref 0.0–0.4)
EOS: 2 %
HEMATOCRIT: 37.5 % (ref 34.0–46.6)
Hemoglobin: 12.5 g/dL (ref 11.1–15.9)
IMMATURE GRANS (ABS): 0 10*3/uL (ref 0.0–0.1)
IMMATURE GRANULOCYTES: 0 %
LYMPHS: 35 %
Lymphocytes Absolute: 2.2 10*3/uL (ref 0.7–3.1)
MCH: 30.9 pg (ref 26.6–33.0)
MCHC: 33.3 g/dL (ref 31.5–35.7)
MCV: 93 fL (ref 79–97)
MONOCYTES: 5 %
MONOS ABS: 0.3 10*3/uL (ref 0.1–0.9)
NEUTROS PCT: 57 %
Neutrophils Absolute: 3.6 10*3/uL (ref 1.4–7.0)
Platelets: 275 10*3/uL (ref 150–379)
RBC: 4.04 x10E6/uL (ref 3.77–5.28)
RDW: 13.6 % (ref 12.3–15.4)
WBC: 6.2 10*3/uL (ref 3.4–10.8)

## 2017-07-25 LAB — COMPREHENSIVE METABOLIC PANEL
A/G RATIO: 1.4 (ref 1.2–2.2)
ALT: 15 IU/L (ref 0–32)
AST: 16 IU/L (ref 0–40)
Albumin: 4.3 g/dL (ref 3.5–5.5)
Alkaline Phosphatase: 96 IU/L (ref 39–117)
BILIRUBIN TOTAL: 0.3 mg/dL (ref 0.0–1.2)
BUN/Creatinine Ratio: 16 (ref 9–23)
BUN: 12 mg/dL (ref 6–20)
CALCIUM: 9.2 mg/dL (ref 8.7–10.2)
CHLORIDE: 103 mmol/L (ref 96–106)
CO2: 20 mmol/L (ref 20–29)
Creatinine, Ser: 0.76 mg/dL (ref 0.57–1.00)
GFR, EST AFRICAN AMERICAN: 120 mL/min/{1.73_m2} (ref >59)
GFR, EST NON AFRICAN AMERICAN: 104 mL/min/{1.73_m2} (ref >59)
GLOBULIN, TOTAL: 3.1 g/dL (ref 1.5–4.5)
Glucose: 76 mg/dL (ref 65–99)
POTASSIUM: 4.1 mmol/L (ref 3.5–5.2)
SODIUM: 140 mmol/L (ref 134–144)
TOTAL PROTEIN: 7.4 g/dL (ref 6.0–8.5)

## 2017-07-25 LAB — TSH: TSH: 2.69 u[IU]/mL (ref 0.450–4.500)

## 2017-07-25 LAB — LIPID PANEL WITH LDL/HDL RATIO
Cholesterol, Total: 150 mg/dL (ref 100–199)
HDL: 62 mg/dL (ref >39)
LDL Calculated: 78 mg/dL (ref 0–99)
LDl/HDL Ratio: 1.3 ratio (ref 0.0–3.2)
Triglycerides: 51 mg/dL (ref 0–149)
VLDL CHOLESTEROL CAL: 10 mg/dL (ref 5–40)

## 2017-07-25 LAB — HIV ANTIBODY (ROUTINE TESTING W REFLEX): HIV Screen 4th Generation wRfx: NONREACTIVE

## 2017-07-25 LAB — VITAMIN D 25 HYDROXY (VIT D DEFICIENCY, FRACTURES): Vit D, 25-Hydroxy: 23.8 ng/mL — ABNORMAL LOW (ref 30.0–100.0)

## 2017-07-25 NOTE — Telephone Encounter (Signed)
Pt requesting when labs come back you may leave a voicemail with results.  States she is at work.  Confirmed her cell number .

## 2017-07-25 NOTE — Telephone Encounter (Signed)
Pt is requesting call back about her lab results. Pt was advised that Antony ContrasJenni was out of the office this afternoon. Please advise. Thanks TNP

## 2017-07-26 ENCOUNTER — Telehealth: Payer: Self-pay

## 2017-07-26 LAB — PAP IG, CT-NG, RFX HPV ASCU
Chlamydia, Nuc. Acid Amp: NEGATIVE
GONOCOCCUS BY NUCLEIC ACID AMP: NEGATIVE
PAP SMEAR COMMENT: 0

## 2017-07-26 NOTE — Telephone Encounter (Signed)
-----   Message from Margaretann LovelessJennifer M Burnette, [place]PA-C sent at 07/26/2017  8:25 AM EST ----- Vit D is still low but improved from previous check. Continue Vit D supplementation. All other labs are all WNL. Pap is still pending.

## 2017-07-26 NOTE — Telephone Encounter (Signed)
Viewed by Satira AnisAshley N Coletta on 07/26/2017 8:31 AM

## 2017-07-27 ENCOUNTER — Telehealth: Payer: Self-pay

## 2017-07-27 NOTE — Telephone Encounter (Signed)
Pt advised.   Thanks,   -Laura  

## 2017-07-27 NOTE — Telephone Encounter (Signed)
-----   Message from Margaretann LovelessJennifer M Burnette, [name] sent at 07/26/2017  4:49 PM EST ----- Pap is normal, GC/chlamydia negative.  Will repeat in 3 years.

## 2017-08-01 ENCOUNTER — Telehealth: Payer: Self-pay | Admitting: Physician Assistant

## 2017-08-01 NOTE — Telephone Encounter (Signed)
Patient brought FMLA forms (2)  into office to be filled out by provider. One [employer] form for sit & stand desk for back pain, and another [employer] form for [employer] for appointments. Forms placed in providers box. Thanks CC

## 2017-08-02 NOTE — Telephone Encounter (Signed)
FYI. KW 

## 2017-08-03 NOTE — Telephone Encounter (Signed)
FMLA papers completed. 

## 2017-08-03 NOTE — Telephone Encounter (Signed)
L/M stating below.  

## 2017-08-04 ENCOUNTER — Telehealth: Payer: Self-pay | Admitting: Physician Assistant

## 2017-08-04 DIAGNOSIS — B379 Candidiasis, unspecified: Secondary | ICD-10-CM

## 2017-08-04 MED ORDER — FLUCONAZOLE 150 MG PO TABS: 150.0000 mg | ORAL_TABLET | Freq: Once | ORAL | 0 refills | Status: AC

## 2017-08-04 NOTE — Telephone Encounter (Signed)
Sent in

## 2017-08-04 NOTE — Telephone Encounter (Signed)
Patient advised.

## 2017-08-04 NOTE — Telephone Encounter (Signed)
Patient is wanting to know if you can send in a refill for fluconazole (DIFLUCAN) 150 MG tablet.  She feels like she is getting a yeast infection again.  She is itching and has a little bit of discharge.  She uses [facility] on [address] [place].

## 2017-08-04 NOTE — Telephone Encounter (Signed)
Please review. Thanks!  

## 2017-08-07 ENCOUNTER — Telehealth: Payer: Self-pay

## 2017-08-07 NOTE — Telephone Encounter (Signed)
Patient reports [name] has not received FMLA papers. Please refax.

## 2017-08-08 NOTE — Telephone Encounter (Signed)
Antony ContrasJenni, do you remember if you completed forms?

## 2017-08-08 NOTE — Telephone Encounter (Signed)
Forms were completed and faxed. Should be on [name]'s desk.

## 2017-08-10 ENCOUNTER — Ambulatory Visit: Payer: Managed Care, Other (non HMO) | Admitting: Physician Assistant

## 2017-08-10 ENCOUNTER — Encounter: Payer: Self-pay | Admitting: Physician Assistant

## 2017-08-10 VITALS — BP 108/70 | HR 78 | Temp 99.1°F | Resp 16 | Wt 282.0 lb

## 2017-08-10 DIAGNOSIS — B379 Candidiasis, unspecified: Secondary | ICD-10-CM | POA: Diagnosis not present

## 2017-08-10 DIAGNOSIS — R350 Frequency of micturition: Secondary | ICD-10-CM | POA: Diagnosis not present

## 2017-08-10 DIAGNOSIS — Z3042 Encounter for surveillance of injectable contraceptive: Secondary | ICD-10-CM | POA: Diagnosis not present

## 2017-08-10 LAB — POCT URINALYSIS DIPSTICK
Bilirubin, UA: NEGATIVE
GLUCOSE UA: NEGATIVE
KETONES UA: NEGATIVE
Nitrite, UA: NEGATIVE
Protein, UA: NEGATIVE
SPEC GRAV UA: 1.015 (ref 1.010–1.025)
UROBILINOGEN UA: 2 U/dL — AB
pH, UA: 6.5 (ref 5.0–8.0)

## 2017-08-10 MED ORDER — MEDROXYPROGESTERONE ACETATE 150 MG/ML IM SUSP
150.0000 mg | Freq: Once | INTRAMUSCULAR | Status: AC
  Administered 2017-08-10: 150 mg via INTRAMUSCULAR

## 2017-08-10 NOTE — Progress Notes (Signed)
Patient: [name] Female    DOB: 06/01/1985   32 y.o.   MRN: [id number] Visit Date: 08/10/2017  Today's Provider: Margaretann LovelessJennifer M May Ozment, [name]   No chief complaint on file.  Subjective:    HPI Patient reports that she was having like cottage cheese discharge and Diflucan was sent and she took the medication. Reports that after taking Diflucan discharge got better but it was still itching. On Monday and Tuesday she reports that she was having the urge to urinate with frequency and pressure on her bladder . Today she is not having any of the symptom. Report no pain when urinating or burning. No foul odor.No hematuria. No abdominal pain. No concerns for STD's.   Patient also has an appointment for her Depo Provero injection this coming Monday and wants to get it today if possible.Last Depo shot was 05/22/2017.       Current Outpatient Medications:  .  albuterol (PROAIR HFA) 108 (90 Base) MCG/ACT inhaler, Inhale 1-2 puffs into the lungs every 6 (six) hours as needed for wheezing or shortness of breath., Disp: 18 g, Rfl: 11 .  furosemide (LASIX) 20 MG tablet, TAKE 1 TABLET BY MOUTH TWO  TIMES DAILY, Disp: 180 tablet, Rfl: 1 .  montelukast (SINGULAIR) 10 MG tablet, TAKE 1 TABLET BY MOUTH AT  BEDTIME, Disp: 90 tablet, Rfl: 1 .  nystatin cream (MYCOSTATIN), Apply 1 application topically 2 (two) times daily., Disp: 30 g, Rfl: 0 .  omeprazole (PRILOSEC) 40 MG capsule, Take 1 capsule (40 mg total) daily by mouth., Disp: 30 capsule, Rfl: 3 .  phentermine (ADIPEX-P) 37.5 MG tablet, Take 1 tablet (37.5 mg total) by mouth daily before breakfast., Disp: 30 tablet, Rfl: 2 .  potassium chloride (K-DUR) 10 MEQ tablet, Take 1 tablet (10 mEq total) by mouth daily., Disp: 90 tablet, Rfl: 1 .  Vitamin D, Ergocalciferol, (DRISDOL) 50000 units CAPS capsule, Take 1 capsule (50,000 Units total) by mouth every 7 (seven) days., Disp: 12 capsule, Rfl: 1 .  medroxyPROGESTERone (DEPO-PROVERA) 150 MG/ML  injection, Inject 1 mL (150 mg total) into the muscle once for 1 dose., Disp: 1 mL, Rfl: 0  Review of Systems  Constitutional: Negative for fever.  Respiratory: Negative for cough, chest tightness and shortness of breath.   Cardiovascular: Negative for chest pain, palpitations and leg swelling.  Gastrointestinal: Negative for abdominal pain, constipation, diarrhea and nausea.  Genitourinary: Negative for dysuria and vaginal pain.    Social History   Tobacco Use  . Smoking status: Current Some Day Smoker    Types: Cigars  . Smokeless tobacco: Never Used  . Tobacco comment: smokes cigars  Substance Use Topics  . Alcohol use: Yes    Alcohol/week: 0.0 oz    Comment: ocassional alcohol use; Mixed drinks once every 2-3 months   Objective:   BP 108/70 (BP Location: Left Wrist, Patient Position: Sitting, Cuff Size: Normal)   Pulse 78   Temp 99.1 F (37.3 C) (Oral)   Resp 16   Wt 282 lb (127.9 kg)   BMI 53.28 kg/m    Physical Exam  Constitutional: She appears well-developed and well-nourished. No distress.  Neck: Normal range of motion. Neck supple.  Cardiovascular: Normal rate, regular rhythm and normal heart sounds. Exam reveals no gallop and no friction rub.  No murmur heard. Pulmonary/Chest: Effort normal and breath sounds normal. No respiratory distress. She has no wheezes. She has no rales.  Skin: She is not diaphoretic.  Vitals reviewed.       Assessment & Plan:     1. Frequency of urination UA improving. No symptoms.  - POCT urinalysis dipstick  2. Yeast infection Improved with diflucan.  3. Encounter for surveillance of injectable contraceptive Depo-provera injection given today. Return in 6 months. - medroxyPROGESTERone (DEPO-PROVERA) injection 150 mg       Margaretann Loveless, [name]  [facility]

## 2017-08-14 ENCOUNTER — Ambulatory Visit: Payer: Self-pay | Admitting: Physician Assistant

## 2017-08-21 ENCOUNTER — Telehealth: Payer: Self-pay | Admitting: Physician Assistant

## 2017-08-21 NOTE — Telephone Encounter (Signed)
Received FMLA form from [employer]. Form was placed in providers box. Thanks CC

## 2017-08-24 ENCOUNTER — Telehealth: Payer: Self-pay

## 2017-08-24 NOTE — Telephone Encounter (Signed)
LM regarding the FMLA paper work to ask what is this one specific for. Example: schedule for bathroom break, appointments or for the sit and desk for back pain

## 2017-08-24 NOTE — Telephone Encounter (Signed)
Got it provider working on it.  Thanks,  -Collin Rengel

## 2017-08-25 ENCOUNTER — Encounter: Payer: Self-pay | Admitting: Physician Assistant

## 2017-08-25 ENCOUNTER — Ambulatory Visit: Payer: Managed Care, Other (non HMO) | Admitting: Physician Assistant

## 2017-08-25 VITALS — BP 118/68 | Temp 98.3°F | Resp 16 | Wt 275.0 lb

## 2017-08-25 DIAGNOSIS — N898 Other specified noninflammatory disorders of vagina: Secondary | ICD-10-CM

## 2017-08-25 DIAGNOSIS — B379 Candidiasis, unspecified: Secondary | ICD-10-CM | POA: Diagnosis not present

## 2017-08-25 DIAGNOSIS — R059 Cough, unspecified: Secondary | ICD-10-CM

## 2017-08-25 DIAGNOSIS — R05 Cough: Secondary | ICD-10-CM | POA: Diagnosis not present

## 2017-08-25 DIAGNOSIS — A5901 Trichomonal vulvovaginitis: Secondary | ICD-10-CM | POA: Diagnosis not present

## 2017-08-25 DIAGNOSIS — T3695XA Adverse effect of unspecified systemic antibiotic, initial encounter: Secondary | ICD-10-CM | POA: Diagnosis not present

## 2017-08-25 DIAGNOSIS — J40 Bronchitis, not specified as acute or chronic: Secondary | ICD-10-CM

## 2017-08-25 MED ORDER — PREDNISONE 10 MG (21) PO TBPK: ORAL_TABLET | ORAL | 0 refills | Status: DC

## 2017-08-25 MED ORDER — PSEUDOEPH-BROMPHEN-DM 30-2-10 MG/5ML PO SYRP: 5.0000 mL | ORAL_SOLUTION | Freq: Three times a day (TID) | ORAL | 0 refills | Status: DC | PRN

## 2017-08-25 MED ORDER — FLUCONAZOLE 150 MG PO TABS: 150.0000 mg | ORAL_TABLET | Freq: Once | ORAL | 0 refills | Status: AC

## 2017-08-25 MED ORDER — DOXYCYCLINE HYCLATE 100 MG PO TABS: 100.0000 mg | ORAL_TABLET | Freq: Two times a day (BID) | ORAL | 0 refills | Status: DC

## 2017-08-25 NOTE — Progress Notes (Signed)
Patient: [name] Female    DOB: 04/18/1986   32 y.o.   MRN: [id number] Visit Date: 08/25/2017  Today's Provider: Margaretann LovelessJennifer M Jayda White, [name]   Chief Complaint  Patient presents with  . Cough  . Vaginal Discharge   Subjective:    Cough  This is a new problem. The cough is productive of sputum. Associated symptoms include nasal congestion, postnasal drip and a sore throat. Pertinent negatives include no fever. She has tried OTC cough suppressant and rest for the symptoms. The treatment provided mild relief. Her past medical history is significant for asthma and environmental allergies.  Vaginal Discharge  The patient's primary symptoms include genital itching and vaginal discharge. The patient's pertinent negatives include no genital odor or genital rash. This is a recurrent problem. The current episode started in the past 7 days (about 3 days). The problem has been gradually worsening. The patient is experiencing no pain. Associated symptoms include a sore throat. Pertinent negatives include no fever.       Allergies  Allergen Reactions  . Lactose Intolerance (Gi) Diarrhea  . Penicillins Hives    Has patient had a PCN reaction causing immediate rash, facial/tongue/throat swelling, SOB or lightheadedness with hypotension: Yes Has patient had a PCN reaction causing severe rash involving mucus membranes or skin necrosis: No Has patient had a PCN reaction that required hospitalization No Has patient had a PCN reaction occurring within the last 10 years: Yes If all of the above answers are "NO", then may proceed with Cephalosporin use.  Marland Kitchen. Pineapple Other (See Comments)    Patient states this cuts my mouth and makes it bleed.     Current Outpatient Medications:  .  albuterol (PROAIR HFA) 108 (90 Base) MCG/ACT inhaler, Inhale 1-2 puffs into the lungs every 6 (six) hours as needed for wheezing or shortness of breath., Disp: 18 g, Rfl: 11 .  furosemide (LASIX) 20 MG tablet,  TAKE 1 TABLET BY MOUTH TWO  TIMES DAILY, Disp: 180 tablet, Rfl: 1 .  montelukast (SINGULAIR) 10 MG tablet, TAKE 1 TABLET BY MOUTH AT  BEDTIME, Disp: 90 tablet, Rfl: 1 .  nystatin cream (MYCOSTATIN), Apply 1 application topically 2 (two) times daily., Disp: 30 g, Rfl: 0 .  omeprazole (PRILOSEC) 40 MG capsule, Take 1 capsule (40 mg total) daily by mouth., Disp: 30 capsule, Rfl: 3 .  phentermine (ADIPEX-P) 37.5 MG tablet, Take 1 tablet (37.5 mg total) by mouth daily before breakfast., Disp: 30 tablet, Rfl: 2 .  potassium chloride (K-DUR) 10 MEQ tablet, Take 1 tablet (10 mEq total) by mouth daily., Disp: 90 tablet, Rfl: 1 .  Vitamin D, Ergocalciferol, (DRISDOL) 50000 units CAPS capsule, Take 1 capsule (50,000 Units total) by mouth every 7 (seven) days., Disp: 12 capsule, Rfl: 1 .  medroxyPROGESTERone (DEPO-PROVERA) 150 MG/ML injection, Inject 1 mL (150 mg total) into the muscle once for 1 dose., Disp: 1 mL, Rfl: 0  Review of Systems  Constitutional: Negative for fever.  HENT: Positive for postnasal drip and sore throat.   Eyes: Negative.   Respiratory: Positive for cough.   Cardiovascular: Negative.   Gastrointestinal: Negative.   Endocrine: Negative.   Genitourinary: Positive for vaginal discharge.  Musculoskeletal: Negative.   Skin: Negative.   Allergic/Immunologic: Positive for environmental allergies.  Neurological: Negative.   Hematological: Negative.   Psychiatric/Behavioral: Negative.     Social History   Tobacco Use  . Smoking status: Current Some Day Smoker    Types:  Cigars  . Smokeless tobacco: Never Used  . Tobacco comment: smokes cigars  Substance Use Topics  . Alcohol use: Yes    Alcohol/week: 0.0 oz    Comment: ocassional alcohol use; Mixed drinks once every 2-3 months   Objective:   BP 118/68 (BP Location: Right Arm, Patient Position: Sitting, Cuff Size: Large)   Temp 98.3 F (36.8 C)   Resp 16   Wt 275 lb (124.7 kg)   BMI 51.96 kg/m  Vitals:   08/25/17 0851    BP: 118/68  Resp: 16  Temp: 98.3 F (36.8 C)  Weight: 275 lb (124.7 kg)     Physical Exam  Constitutional: She appears well-developed and well-nourished. No distress.  HENT:  Head: Normocephalic and atraumatic.  Right Ear: Hearing, tympanic membrane, external ear and ear canal normal.  Left Ear: Hearing, tympanic membrane, external ear and ear canal normal.  Nose: Nose normal.  Mouth/Throat: Uvula is midline, oropharynx is clear and moist and mucous membranes are normal. No oropharyngeal exudate.  Eyes: Pupils are equal, round, and reactive to light. Conjunctivae are normal. Right eye exhibits no discharge. Left eye exhibits no discharge. No scleral icterus.  Neck: Normal range of motion. Neck supple. No tracheal deviation present. No thyromegaly present.  Cardiovascular: Normal rate, regular rhythm and normal heart sounds. Exam reveals no gallop and no friction rub.  No murmur heard. Pulmonary/Chest: Effort normal. No stridor. No respiratory distress. She has wheezes (inspiratory and expiratory). She has no rales.  Lymphadenopathy:    She has no cervical adenopathy.  Skin: Skin is warm and dry. She is not diaphoretic.  Vitals reviewed.      Assessment & Plan:     1. Bronchitis Worsening symptoms that have not responded to OTC medications. Will give Doxycycline as below. Continue allergy medications. Stay well hydrated and get plenty of rest. Call if no symptom improvement or if symptoms worsen. - doxycycline (VIBRA-TABS) 100 MG tablet; Take 1 tablet (100 mg total) by mouth 2 (two) times daily.  Dispense: 20 tablet; Refill: 0 - predniSONE (STERAPRED UNI-PAK 21 TAB) 10 MG (21) TBPK tablet; 6 day taper; take as directed on package instructions  Dispense: 21 tablet; Refill: 0  2. Cough Bromfed DM for cough. Push fluids.  - brompheniramine-pseudoephedrine-DM 30-2-10 MG/5ML syrup; Take 5 mLs by mouth 3 (three) times daily as needed.  Dispense: 140 mL; Refill: 0  3.  Antibiotic-induced yeast infection  - fluconazole (DIFLUCAN) 150 MG tablet; Take 1 tablet (150 mg total) by mouth once for 1 dose. May repeat if needed 48-72 hrs after 1st  Dispense: 2 tablet; Refill: 0  4. Vaginal discharge New onset. Yellow in color. Nuswab collected today. I will f/u pending results.  - NuSwab Vaginitis Plus (VG+)       Margaretann Loveless, [name]  [facility]

## 2017-08-25 NOTE — Telephone Encounter (Signed)
Patient came for an office visit today. Rachelle talked to patient in the room and stated it was for her appointments.

## 2017-08-25 NOTE — Patient Instructions (Signed)

## 2017-08-28 ENCOUNTER — Telehealth: Payer: Self-pay

## 2017-08-28 ENCOUNTER — Encounter: Payer: Self-pay | Admitting: Physician Assistant

## 2017-08-28 MED ORDER — METRONIDAZOLE 500 MG PO TABS: 500.0000 mg | ORAL_TABLET | Freq: Two times a day (BID) | ORAL | 0 refills | Status: DC

## 2017-08-28 NOTE — Telephone Encounter (Signed)
Please see other phone note. Answer patient all patient's questions. Provider sitting next to me.  Thanks,  -Anika Shore

## 2017-08-28 NOTE — Telephone Encounter (Signed)
Patient advised as directed below. 

## 2017-08-28 NOTE — Telephone Encounter (Signed)
Patient advised as directed below. Per patient does she still needs to stay on the doxycycline or does she stops that antibiotic and starts Flagyl?

## 2017-08-28 NOTE — Telephone Encounter (Signed)
-----   Message from Margaretann LovelessJennifer M Burnette, [place]-C sent at 08/28/2017  1:58 PM EDT ----- Please notify patient NuSwab is positive for trichomonas. This is an STD and has to be [facility]. It is treated with metronidazole. Please call if symptoms do not improve. Also do not have unprotected sexual intercourse with any partners until they have also been treated to prevent reinfection.

## 2017-08-28 NOTE — Telephone Encounter (Signed)
Faxed FMLA forms to fax # [phone]. sd

## 2017-08-28 NOTE — Telephone Encounter (Signed)
Doxycycline was for her sinus infection. So she should complete that as well as take the metronidazole.

## 2017-08-28 NOTE — Addendum Note (Signed)
Addended by: Margaretann LovelessBURNETTE, JENNIFER M on: 08/28/2017 01:59 PM   Modules accepted: Orders

## 2017-08-30 ENCOUNTER — Telehealth: Payer: Self-pay

## 2017-08-30 LAB — NUSWAB VAGINITIS PLUS (VG+)
Candida albicans, NAA: POSITIVE — AB
Candida glabrata, NAA: NEGATIVE
Chlamydia trachomatis, NAA: NEGATIVE
Neisseria gonorrhoeae, NAA: NEGATIVE
Trich vag by NAA: POSITIVE — AB

## 2017-08-30 NOTE — Telephone Encounter (Signed)
-----   Message from Margaretann LovelessJennifer M Burnette, [place] sent at 08/30/2017  9:06 AM EDT ----- Melanie RenNuswab was also positive for yeast as well, negative for BV. Diflucan will treat yeast and you already have this.

## 2017-08-30 NOTE — Telephone Encounter (Signed)
Patient was advised. Patient wanted fiance to be treated as well. He is a patient of [facility]. Will send request.

## 2017-09-01 ENCOUNTER — Ambulatory Visit: Payer: Managed Care, Other (non HMO) | Admitting: Physician Assistant

## 2017-09-01 ENCOUNTER — Encounter: Payer: Self-pay | Admitting: Physician Assistant

## 2017-09-01 VITALS — BP 120/80 | HR 120 | Temp 98.9°F | Resp 16 | Wt 278.0 lb

## 2017-09-01 DIAGNOSIS — R05 Cough: Secondary | ICD-10-CM

## 2017-09-01 DIAGNOSIS — R059 Cough, unspecified: Secondary | ICD-10-CM

## 2017-09-01 DIAGNOSIS — J40 Bronchitis, not specified as acute or chronic: Secondary | ICD-10-CM | POA: Diagnosis not present

## 2017-09-01 MED ORDER — AZITHROMYCIN 250 MG PO TABS: ORAL_TABLET | ORAL | 0 refills | Status: DC

## 2017-09-01 MED ORDER — FLUTICASONE FUROATE-VILANTEROL 100-25 MCG/INH IN AEPB: 1.0000 | INHALATION_SPRAY | Freq: Every day | RESPIRATORY_TRACT | 0 refills | Status: DC

## 2017-09-01 MED ORDER — HYDROCOD POLST-CPM POLST ER 10-8 MG/5ML PO SUER: 5.0000 mL | Freq: Every evening | ORAL | 0 refills | Status: DC | PRN

## 2017-09-01 MED ORDER — PREDNISONE 10 MG PO TABS
ORAL_TABLET | ORAL | 0 refills | Status: DC
Start: 2017-09-01 — End: 2017-11-16

## 2017-09-01 NOTE — Progress Notes (Signed)
Patient: [name] Female    DOB: 08/14/1985   32 y.o.   MRN: [id number] Visit Date: 09/01/2017  Today's Provider: Margaretann LovelessJennifer M Burnette, [name]   Chief Complaint  Patient presents with  . Cough   Subjective:    HPI Patient here today C/O recurrent cough, chest congestion and wheezing. Patient reports she did finish her antibiotic and prednisone that was started on 08/25/17. Patient denies any fever. Patient reports using her Albuterol inhaler 5 times yesterday and once today. Patient reports no improvement with inhaler or cough medication.     Allergies  Allergen Reactions  . Lactose Intolerance (Gi) Diarrhea  . Penicillins Hives    Has patient had a PCN reaction causing immediate rash, facial/tongue/throat swelling, SOB or lightheadedness with hypotension: Yes Has patient had a PCN reaction causing severe rash involving mucus membranes or skin necrosis: No Has patient had a PCN reaction that required hospitalization No Has patient had a PCN reaction occurring within the last 10 years: Yes If all of the above answers are "NO", then may proceed with Cephalosporin use.  Marland Kitchen. Pineapple Other (See Comments)    Patient states this cuts my mouth and makes it bleed.     Current Outpatient Medications:  .  albuterol (PROAIR HFA) 108 (90 Base) MCG/ACT inhaler, Inhale 1-2 puffs into the lungs every 6 (six) hours as needed for wheezing or shortness of breath., Disp: 18 g, Rfl: 11 .  brompheniramine-pseudoephedrine-DM 30-2-10 MG/5ML syrup, Take 5 mLs by mouth 3 (three) times daily as needed., Disp: 140 mL, Rfl: 0 .  furosemide (LASIX) 20 MG tablet, TAKE 1 TABLET BY MOUTH TWO  TIMES DAILY, Disp: 180 tablet, Rfl: 1 .  metroNIDAZOLE (FLAGYL) 500 MG tablet, Take 1 tablet (500 mg total) by mouth 2 (two) times daily., Disp: 14 tablet, Rfl: 0 .  montelukast (SINGULAIR) 10 MG tablet, TAKE 1 TABLET BY MOUTH AT  BEDTIME, Disp: 90 tablet, Rfl: 1 .  nystatin cream (MYCOSTATIN), Apply 1  application topically 2 (two) times daily., Disp: 30 g, Rfl: 0 .  omeprazole (PRILOSEC) 40 MG capsule, Take 1 capsule (40 mg total) daily by mouth., Disp: 30 capsule, Rfl: 3 .  phentermine (ADIPEX-P) 37.5 MG tablet, Take 1 tablet (37.5 mg total) by mouth daily before breakfast., Disp: 30 tablet, Rfl: 2 .  potassium chloride (K-DUR) 10 MEQ tablet, Take 1 tablet (10 mEq total) by mouth daily., Disp: 90 tablet, Rfl: 1 .  Vitamin D, Ergocalciferol, (DRISDOL) 50000 units CAPS capsule, Take 1 capsule (50,000 Units total) by mouth every 7 (seven) days., Disp: 12 capsule, Rfl: 1 .  medroxyPROGESTERone (DEPO-PROVERA) 150 MG/ML injection, Inject 1 mL (150 mg total) into the muscle once for 1 dose., Disp: 1 mL, Rfl: 0  Review of Systems  Constitutional: Negative.   HENT: Positive for congestion, sore throat and voice change.   Respiratory: Positive for cough and wheezing.     Social History   Tobacco Use  . Smoking status: Current Some Day Smoker    Types: Cigars  . Smokeless tobacco: Never Used  . Tobacco comment: smokes cigars  Substance Use Topics  . Alcohol use: Yes    Alcohol/week: 0.0 oz    Comment: ocassional alcohol use; Mixed drinks once every 2-3 months   Objective:   BP 120/80 (BP Location: Left Arm, Patient Position: Sitting, Cuff Size: Large)   Pulse (!) 120   Temp 98.9 F (37.2 C) (Oral)   Resp 16  Wt 278 lb (126.1 kg)   SpO2 98%   BMI 52.53 kg/m  Vitals:   09/01/17 1443  BP: 120/80  Pulse: (!) 120  Resp: 16  Temp: 98.9 F (37.2 C)  TempSrc: Oral  SpO2: 98%  Weight: 278 lb (126.1 kg)     Physical Exam  Constitutional: She appears well-developed and well-nourished. No distress.  HENT:  Head: Normocephalic and atraumatic.  Right Ear: Hearing, tympanic membrane, external ear and ear canal normal.  Left Ear: Hearing, external ear and ear canal normal. A middle ear effusion is present.  Nose: Mucosal edema and rhinorrhea present. Right sinus exhibits no maxillary  sinus tenderness and no frontal sinus tenderness. Left sinus exhibits no maxillary sinus tenderness and no frontal sinus tenderness.  Mouth/Throat: Uvula is midline, oropharynx is clear and moist and mucous membranes are normal. No oropharyngeal exudate, posterior oropharyngeal edema or posterior oropharyngeal erythema.  Eyes: Pupils are equal, round, and reactive to light. Conjunctivae are normal. Right eye exhibits no discharge. Left eye exhibits no discharge. No scleral icterus.  Neck: Normal range of motion. Neck supple. No tracheal deviation present. No thyromegaly present.  Cardiovascular: Normal rate, regular rhythm and normal heart sounds. Exam reveals no gallop and no friction rub.  No murmur heard. Pulmonary/Chest: Effort normal and breath sounds normal. No stridor. No respiratory distress. She has no wheezes. She has no rales.  Lymphadenopathy:    She has no cervical adenopathy.  Skin: Skin is warm and dry. She is not diaphoretic.  Vitals reviewed.       Assessment & Plan:     1. Bronchitis Breo sent in for worsening asthmatic bronchitis. Continue albuterol prn. Zpak for infection. Push fluids. Rest. Add steroid as below by Monday if still no improvement.  - fluticasone furoate-vilanterol (BREO ELLIPTA) 100-25 MCG/INH AEPB; Inhale 1 puff into the lungs daily.  Dispense: 60 each; Refill: 0 - predniSONE (DELTASONE) 10 MG tablet; Take 6 tabs PO on day 1&2, 5 tabs PO on day 3&4, 4 tabs PO on day 5&6, 3 tabs PO on day 7&8, 2 tabs PO on day 9&10, 1 tab PO on day 11&12.  Dispense: 42 tablet; Refill: 0 - azithromycin (ZITHROMAX) 250 MG tablet; Take 2 tablets PO on day one, and one tablet PO daily thereafter until completed.  Dispense: 6 tablet; Refill: 0  2. Cough Worsening symptoms that has not responded to OTC medications. Will give Tussionex cough syrup as below for nighttime cough. Drowsiness precautions given to patient. Stay well hydrated. Use delsym, robitussin OR mucinex for  daytime cough. - chlorpheniramine-HYDROcodone (TUSSIONEX PENNKINETIC ER) 10-8 MG/5ML SUER; Take 5 mLs by mouth at bedtime as needed for cough.  Dispense: 140 mL; Refill: 0       Margaretann Loveless, [name]  [facility]

## 2017-09-22 ENCOUNTER — Encounter: Payer: Self-pay | Admitting: Physician Assistant

## 2017-09-22 ENCOUNTER — Telehealth: Payer: Self-pay | Admitting: Physician Assistant

## 2017-09-22 DIAGNOSIS — B9689 Other specified bacterial agents as the cause of diseases classified elsewhere: Secondary | ICD-10-CM

## 2017-09-22 DIAGNOSIS — N760 Acute vaginitis: Secondary | ICD-10-CM

## 2017-09-22 MED ORDER — METRONIDAZOLE 0.75 % VA GEL: 1.0000 | Freq: Two times a day (BID) | VAGINAL | 0 refills | Status: DC

## 2017-09-22 NOTE — Telephone Encounter (Signed)
Answer is in [employer]

## 2017-09-22 NOTE — Telephone Encounter (Signed)
Pt was in a couple of weeks ago with a vaginal infection.  She was given a couple of antibiotics and got better.   She is now having symptoms again.  She says they are not exactly the same.  She said she is going to send you a message through My Chart and explain more.  She could not talk at work  She would like someone to call her back or respond through My Chart.  Thanks terri

## 2017-10-25 ENCOUNTER — Telehealth: Payer: Self-pay | Admitting: Physician Assistant

## 2017-10-25 NOTE — Telephone Encounter (Signed)
Received [employer] formto be filled out by provider. Placed form in providers box. Thanks CC

## 2017-10-27 NOTE — Telephone Encounter (Signed)
Forms faxed today.  Thanks,  -Joseline

## 2017-10-27 NOTE — Telephone Encounter (Signed)
Completed and will be given to DominicaJosie or Suli.

## 2017-11-13 ENCOUNTER — Telehealth: Payer: Self-pay | Admitting: *Deleted

## 2017-11-13 NOTE — Telephone Encounter (Signed)
LMTCB  Thanks,  -[name] 

## 2017-11-13 NOTE — Telephone Encounter (Signed)
Where was she seen at on 11/10/17. She has not been seen in office here since 09/01/17. Is it breo she is using? Is she using her albuterol inhaler?

## 2017-11-13 NOTE — Telephone Encounter (Signed)
Please Review.  Thanks,  -Joseline 

## 2017-11-13 NOTE — Telephone Encounter (Signed)
Patient stated the inhaler she was given 11/10/2017 is not helping. Patient states she is still having trouble with her breathing [facility] However patient wanted to know if there is another medication she can until her appt? Patient states you can communicate with her on MyChart if you need to. Please advise?

## 2017-11-16 ENCOUNTER — Encounter: Payer: Self-pay | Admitting: Physician Assistant

## 2017-11-16 ENCOUNTER — Ambulatory Visit: Payer: Managed Care, Other (non HMO) | Admitting: Physician Assistant

## 2017-11-16 VITALS — BP 126/80 | HR 104 | Temp 98.9°F | Resp 16 | Ht 61.0 in | Wt 283.0 lb

## 2017-11-16 DIAGNOSIS — J40 Bronchitis, not specified as acute or chronic: Secondary | ICD-10-CM

## 2017-11-16 DIAGNOSIS — R609 Edema, unspecified: Secondary | ICD-10-CM | POA: Diagnosis not present

## 2017-11-16 DIAGNOSIS — Z3042 Encounter for surveillance of injectable contraceptive: Secondary | ICD-10-CM

## 2017-11-16 DIAGNOSIS — R638 Other symptoms and signs concerning food and fluid intake: Secondary | ICD-10-CM

## 2017-11-16 DIAGNOSIS — Z6841 Body Mass Index (BMI) 40.0 and over, adult: Secondary | ICD-10-CM

## 2017-11-16 DIAGNOSIS — J4520 Mild intermittent asthma, uncomplicated: Secondary | ICD-10-CM

## 2017-11-16 DIAGNOSIS — Z8679 Personal history of other diseases of the circulatory system: Secondary | ICD-10-CM | POA: Diagnosis not present

## 2017-11-16 MED ORDER — FLUTICASONE FUROATE-VILANTEROL 100-25 MCG/INH IN AEPB: 1.0000 | INHALATION_SPRAY | Freq: Every day | RESPIRATORY_TRACT | 11 refills | Status: DC

## 2017-11-16 MED ORDER — ALBUTEROL SULFATE HFA 108 (90 BASE) MCG/ACT IN AERS: 1.0000 | INHALATION_SPRAY | Freq: Four times a day (QID) | RESPIRATORY_TRACT | 11 refills | Status: DC | PRN

## 2017-11-16 MED ORDER — FUROSEMIDE 20 MG PO TABS: 20.0000 mg | ORAL_TABLET | Freq: Three times a day (TID) | ORAL | 1 refills | Status: DC

## 2017-11-16 MED ORDER — MEDROXYPROGESTERONE ACETATE 150 MG/ML IM SUSP
150.0000 mg | Freq: Once | INTRAMUSCULAR | Status: AC
  Administered 2017-11-16: 150 mg via INTRAMUSCULAR

## 2017-11-16 NOTE — Progress Notes (Signed)
Patient: [name] Female    DOB: 02/05/1986   32 y.o.   MRN: [id number] Visit Date: 11/16/2017  Today's Provider: Margaretann LovelessJennifer M Yaniyah Koors, [name]   Chief Complaint  Patient presents with  . Contraception  . Obesity  . Follow-up   Subjective:    HPI Patient here today for contraception management last depo injection was given on 08/10/17. Patient reports go tolerance and denies any abnormal side effects.   Follow up ER visit  [facility] for chest pain, blurred vision, shortness and breath, and head ache on 11/13/17 . She was treated for  Hypertension and tachycardia. Treatment for this included double up on lasix on Tuesday and Wednesday. She reports excellent compliance with treatment. She reports this condition is Unchanged. ------------------------------------------------------------------------------------    Allergies  Allergen Reactions  . Lactose Intolerance (Gi) Diarrhea  . Penicillins Hives    Has patient had a PCN reaction causing immediate rash, facial/tongue/throat swelling, SOB or lightheadedness with hypotension: Yes Has patient had a PCN reaction causing severe rash involving mucus membranes or skin necrosis: No Has patient had a PCN reaction that required hospitalization No Has patient had a PCN reaction occurring within the last 10 years: Yes If all of the above answers are "NO", then may proceed with Cephalosporin use.  Marland Kitchen. Pineapple Other (See Comments)    Patient states this cuts my mouth and makes it bleed.     Current Outpatient Medications:  .  albuterol (PROAIR HFA) 108 (90 Base) MCG/ACT inhaler, Inhale 1-2 puffs into the lungs every 6 (six) hours as needed for wheezing or shortness of breath., Disp: 18 g, Rfl: 11 .  fluticasone furoate-vilanterol (BREO ELLIPTA) 100-25 MCG/INH AEPB, Inhale 1 puff into the lungs daily., Disp: 60 each, Rfl: 0 .  furosemide (LASIX) 20 MG tablet, TAKE 1 TABLET BY MOUTH TWO  TIMES DAILY,  Disp: 180 tablet, Rfl: 1 .  medroxyPROGESTERone (DEPO-PROVERA) 150 MG/ML injection, Inject 1 mL (150 mg total) into the muscle once for 1 dose., Disp: 1 mL, Rfl: 0 .  metroNIDAZOLE (METROGEL) 0.75 % vaginal gel, Place 1 Applicatorful vaginally 2 (two) times daily., Disp: 70 g, Rfl: 0 .  montelukast (SINGULAIR) 10 MG tablet, TAKE 1 TABLET BY MOUTH AT  BEDTIME, Disp: 90 tablet, Rfl: 1 .  omeprazole (PRILOSEC) 40 MG capsule, Take 1 capsule (40 mg total) daily by mouth., Disp: 30 capsule, Rfl: 3 .  phentermine (ADIPEX-P) 37.5 MG tablet, Take 1 tablet (37.5 mg total) by mouth daily before breakfast., Disp: 30 tablet, Rfl: 2 .  potassium chloride (K-DUR) 10 MEQ tablet, Take 1 tablet (10 mEq total) by mouth daily., Disp: 90 tablet, Rfl: 1 .  Vitamin D, Ergocalciferol, (DRISDOL) 50000 units CAPS capsule, Take 1 capsule (50,000 Units total) by mouth every 7 (seven) days., Disp: 12 capsule, Rfl: 1 .  nystatin cream (MYCOSTATIN), Apply 1 application topically 2 (two) times daily. (Patient not taking: Reported on 11/16/2017), Disp: 30 g, Rfl: 0  Review of Systems  Constitutional: Positive for fatigue.  Respiratory: Positive for chest tightness and shortness of breath. Negative for cough and wheezing.   Cardiovascular: Positive for leg swelling. Negative for chest pain and palpitations.  Gastrointestinal: Negative.   Neurological: Negative.     Social History   Tobacco Use  . Smoking status: Current Some Day Smoker    Types: Cigars  . Smokeless tobacco: Never Used  . Tobacco comment: smokes cigars  Substance Use Topics  .  Alcohol use: Yes    Alcohol/week: 0.0 oz    Comment: ocassional alcohol use; Mixed drinks once every 2-3 months   Objective:   BP 126/80 (BP Location: Left Wrist, Patient Position: Sitting, Cuff Size: Normal)   Pulse (!) 104   Temp 98.9 F (37.2 C) (Oral)   Resp 16   Ht 5\' 1"  (1.549 m)   Wt 283 lb (128.4 kg)   SpO2 99%   BMI 53.47 kg/m  Vitals:   11/16/17 1557  BP:  126/80  Pulse: (!) 104  Resp: 16  Temp: 98.9 F (37.2 C)  TempSrc: Oral  SpO2: 99%  Weight: 283 lb (128.4 kg)  Height: 5\' 1"  (1.549 m)     Physical Exam  Constitutional: She appears well-developed and well-nourished. No distress.  Morbidly obese  Neck: Normal range of motion. Neck supple. No JVD present. No tracheal deviation present. No thyromegaly present.  Cardiovascular: Normal rate, regular rhythm and normal heart sounds. Exam reveals no gallop and no friction rub.  No murmur heard. Pulmonary/Chest: Effort normal and breath sounds normal. No respiratory distress. She has no wheezes. She has no rales.  Musculoskeletal: She exhibits edema (2+ non-pitting edema bilat).  Lymphadenopathy:    She has no cervical adenopathy.  Skin: She is not diaphoretic.  Vitals reviewed.      Assessment & Plan:     1. Encounter for surveillance of injectable contraceptive Depoprovera given without issue. Return in 3 months.  - medroxyPROGESTERone (DEPO-PROVERA) injection 150 mg  2. Body fluid retention Patient reports the 2 days she was on 3 lasix daily instead of 2 she noticed symptom improvement. Once she decreased back to 2 tablets she started feeling same symptoms. Does have a h/o CHF during pregnancy. Previously followed by Dr. Dwaine Deter. Not seen since 2017 due to insurance lapse. Now back with insurance and can re-establish. Symptoms suspicious of CHF exacerbation. Will keep her on the lasix 20mg  TID. Referral placed back to cardiology for further evaluation.  - furosemide (LASIX) 20 MG tablet; Take 1 tablet (20 mg total) by mouth 3 (three) times daily.  Dispense: 90 tablet; Refill: 1 - Ambulatory referral to Cardiology  3. H/O CHF See above medical treatment plan. - furosemide (LASIX) 20 MG tablet; Take 1 tablet (20 mg total) by mouth 3 (three) times daily.  Dispense: 90 tablet; Refill: 1 - Ambulatory referral to Cardiology  4. Salt craving Reports intense salt and sugar cravings  daily with inability to lose weight. Previous labs reviewed from [facility] ED. Renal function and electrolytes unremarkable. Will check cortisol for adrenal insuff as below. I will f/u pending results.  - Cortisol - Ambulatory referral to Cardiology  5. Bronchitis Stable. Diagnosis pulled for medication refill. Continue current medical treatment plan. - fluticasone furoate-vilanterol (BREO ELLIPTA) 100-25 MCG/INH AEPB; Inhale 1 puff into the lungs daily.  Dispense: 60 each; Refill: 11  6. RAD (reactive airway disease), mild intermittent, uncomplicated Stable. Diagnosis pulled for medication refill. Continue current medical treatment plan. - albuterol (PROAIR HFA) 108 (90 Base) MCG/ACT inhaler; Inhale 1-2 puffs into the lungs every 6 (six) hours as needed for wheezing or shortness of breath.  Dispense: 18 g; Refill: 11  7. BMI 50.0-59.9, adult (HCC) Counseled patient on healthy lifestyle modifications including dieting and exercise.  Failed phentermine previously. Has been off x 2 months. Back up to 283 pounds from 275 pounds.  - Ambulatory referral to Cardiology       Margaretann Loveless, [name]  [facility]  

## 2017-11-16 NOTE — Telephone Encounter (Signed)
Patient seen today for f/u and address.  Thanks,  -Joseline

## 2017-11-18 LAB — CORTISOL: Cortisol: 8.4 ug/dL

## 2017-11-20 ENCOUNTER — Telehealth: Payer: Self-pay

## 2017-11-20 NOTE — Telephone Encounter (Signed)
Viewed by Satira AnisAshley N Tomkinson on 11/18/2017 9:43 AM

## 2017-12-14 ENCOUNTER — Telehealth: Payer: Self-pay

## 2017-12-14 NOTE — Telephone Encounter (Signed)
LM that appeal form is ready for pick up.  Thanks,  -Joseline

## 2017-12-18 ENCOUNTER — Encounter: Payer: Self-pay | Admitting: Physician Assistant

## 2017-12-28 ENCOUNTER — Ambulatory Visit: Payer: Managed Care, Other (non HMO) | Admitting: Family Medicine

## 2017-12-28 ENCOUNTER — Encounter: Payer: Self-pay | Admitting: Family Medicine

## 2017-12-28 DIAGNOSIS — J45901 Unspecified asthma with (acute) exacerbation: Secondary | ICD-10-CM | POA: Insufficient documentation

## 2017-12-28 DIAGNOSIS — J4541 Moderate persistent asthma with (acute) exacerbation: Secondary | ICD-10-CM | POA: Insufficient documentation

## 2017-12-28 MED ORDER — PREDNISONE 20 MG PO TABS: 40.0000 mg | ORAL_TABLET | Freq: Every day | ORAL | 0 refills | Status: AC

## 2017-12-28 NOTE — Progress Notes (Signed)
Patient: [name] Female    DOB: 11/04/1985   32 y.o.   MRN: [id number] Visit Date: 12/28/2017  Today's Provider: Shirlee LatchAngela Mikalah Skyles, MD   Chief Complaint  Patient presents with  . URI   Subjective:    URI   This is a new problem. The current episode started in the past 7 days. The problem has been gradually worsening. There has been no fever. Associated symptoms include congestion, coughing, headaches, rhinorrhea, a sore throat and wheezing. Pertinent negatives include no ear pain, sinus pain or sneezing. She has tried acetaminophen, antihistamine and decongestant for the symptoms. The treatment provided mild relief.   Patient reports that she has had low grade fever, up to 100 about 3 days ago. Started with sore throat and fatigue until 3-4 days ago.  Then chills, myalgias and congestion started.  She stayed out of work Monday as she was feeling poorly.  Tried Alka Seltzer Cold and Flu.  She is concerned that coughing and wheezing have increased as she has asthma.  She is taking Breo consistently.  She has needed Albuterol 2-3 times daily.    Allergies  Allergen Reactions  . Lactose Intolerance (Gi) Diarrhea  . Penicillins Hives    Has patient had a PCN reaction causing immediate rash, facial/tongue/throat swelling, SOB or lightheadedness with hypotension: Yes Has patient had a PCN reaction causing severe rash involving mucus membranes or skin necrosis: No Has patient had a PCN reaction that required hospitalization No Has patient had a PCN reaction occurring within the last 10 years: Yes If all of the above answers are "NO", then may proceed with Cephalosporin use.  Marland Kitchen. Pineapple Other (See Comments)    Patient states this cuts my mouth and makes it bleed.     Current Outpatient Medications:  .  albuterol (PROAIR HFA) 108 (90 Base) MCG/ACT inhaler, Inhale 1-2 puffs into the lungs every 6 (six) hours as needed for wheezing or shortness of breath., Disp: 18 g, Rfl:  11 .  fluticasone furoate-vilanterol (BREO ELLIPTA) 100-25 MCG/INH AEPB, Inhale 1 puff into the lungs daily., Disp: 60 each, Rfl: 11 .  furosemide (LASIX) 20 MG tablet, Take 1 tablet (20 mg total) by mouth 3 (three) times daily., Disp: 90 tablet, Rfl: 1 .  montelukast (SINGULAIR) 10 MG tablet, TAKE 1 TABLET BY MOUTH AT  BEDTIME, Disp: 90 tablet, Rfl: 1 .  omeprazole (PRILOSEC) 40 MG capsule, Take 1 capsule (40 mg total) daily by mouth., Disp: 30 capsule, Rfl: 3 .  potassium chloride (K-DUR) 10 MEQ tablet, Take 1 tablet (10 mEq total) by mouth daily., Disp: 90 tablet, Rfl: 1 .  Vitamin D, Ergocalciferol, (DRISDOL) 50000 units CAPS capsule, Take 1 capsule (50,000 Units total) by mouth every 7 (seven) days., Disp: 12 capsule, Rfl: 1 .  medroxyPROGESTERone (DEPO-PROVERA) 150 MG/ML injection, Inject 1 mL (150 mg total) into the muscle once for 1 dose., Disp: 1 mL, Rfl: 0 .  metroNIDAZOLE (METROGEL) 0.75 % vaginal gel, Place 1 Applicatorful vaginally 2 (two) times daily. (Patient not taking: Reported on 12/28/2017), Disp: 70 g, Rfl: 0 .  nystatin cream (MYCOSTATIN), Apply 1 application topically 2 (two) times daily. (Patient not taking: Reported on 11/16/2017), Disp: 30 g, Rfl: 0 .  predniSONE (DELTASONE) 20 MG tablet, Take 2 tablets (40 mg total) by mouth daily with breakfast for 5 days., Disp: 10 tablet, Rfl: 0  Review of Systems  Constitutional: Negative.   HENT: Positive for congestion, rhinorrhea and sore  throat. Negative for dental problem, drooling, ear discharge, ear pain, facial swelling, hearing loss, mouth sores, nosebleeds, postnasal drip, sinus pressure, sinus pain and sneezing.   Respiratory: Positive for cough and wheezing. Negative for apnea, choking, chest tightness, shortness of breath and stridor.   Cardiovascular: Negative.   Gastrointestinal: Negative.   Genitourinary: Negative.   Neurological: Positive for headaches.  Hematological: Negative.   Psychiatric/Behavioral: Negative.      Social History   Tobacco Use  . Smoking status: Current Some Day Smoker    Types: Cigars  . Smokeless tobacco: Never Used  . Tobacco comment: smokes cigars  Substance Use Topics  . Alcohol use: Yes    Alcohol/week: 0.0 standard drinks    Comment: ocassional alcohol use; Mixed drinks once every 2-3 months   Objective:   BP 118/72 (BP Location: Right Arm, Patient Position: Sitting, Cuff Size: Large)   Pulse 84   Temp 99.1 F (37.3 C)   Resp 20   Ht 5\' 1"  (1.549 m)   Wt 286 lb (129.7 kg)   BMI 54.04 kg/m  Vitals:   12/28/17 0846  BP: 118/72  Pulse: 84  Resp: 20  Temp: 99.1 F (37.3 C)  Weight: 286 lb (129.7 kg)  Height: 5\' 1"  (1.549 m)     Physical Exam  Constitutional: She is oriented to person, place, and time. She appears well-developed and well-nourished. No distress.  HENT:  Head: Normocephalic and atraumatic.  Right Ear: External ear normal.  Left Ear: External ear normal.  Nose: Nose normal.  Mouth/Throat: Oropharynx is clear and moist. No oropharyngeal exudate.  Eyes: Pupils are equal, round, and reactive to light. Conjunctivae are normal. No scleral icterus.  Neck: Neck supple. No thyromegaly present.  Cardiovascular: Normal rate, regular rhythm, normal heart sounds and intact distal pulses.  No murmur heard. Pulmonary/Chest: Effort normal. No respiratory distress. She has wheezes (diffusely).  Abdominal: Soft. She exhibits no distension. There is no tenderness.  Musculoskeletal: She exhibits no edema.  Lymphadenopathy:    She has no cervical adenopathy.  Neurological: She is alert and oriented to person, place, and time.  Skin: Skin is warm and dry. Capillary refill takes less than 2 seconds. No rash noted.  Psychiatric: She has a normal mood and affect. Her behavior is normal.  Vitals reviewed.      Assessment & Plan:   1. Moderate persistent asthma with exacerbation - SOB, cough and wheezing for several days - suspect exacerbation started  by viral URI -No signs of bacterial infection -Treat upper respiratory symptoms symptomatically - Treat asthma exacerbation with 5-day burst of prednisone -Return precautions discussed -Can continue albuterol as needed and Breo consistently as controller medication    Meds ordered this encounter  Medications  . predniSONE (DELTASONE) 20 MG tablet    Sig: Take 2 tablets (40 mg total) by mouth daily with breakfast for 5 days.    Dispense:  10 tablet    Refill:  0     Return if symptoms worsen or fail to improve.   The entirety of the information documented in the History of Present Illness, Review of Systems and Physical Exam were personally obtained by me. Portions of this information were initially documented by Anson Oregon, CMA and reviewed by me for thoroughness and accuracy.    Erasmo Downer, MD, MPH [facility] 12/28/2017 11:15 AM

## 2017-12-28 NOTE — Patient Instructions (Signed)

## 2018-01-03 ENCOUNTER — Encounter: Payer: Self-pay | Admitting: Physician Assistant

## 2018-01-03 DIAGNOSIS — J40 Bronchitis, not specified as acute or chronic: Secondary | ICD-10-CM

## 2018-01-03 MED ORDER — AZITHROMYCIN 250 MG PO TABS: ORAL_TABLET | ORAL | 0 refills | Status: DC

## 2018-01-16 ENCOUNTER — Encounter: Payer: Self-pay | Admitting: Physician Assistant

## 2018-01-24 ENCOUNTER — Encounter

## 2018-01-24 ENCOUNTER — Ambulatory Visit: Payer: Managed Care, Other (non HMO) | Admitting: Cardiovascular Disease

## 2018-01-24 ENCOUNTER — Encounter: Payer: Self-pay | Admitting: Cardiovascular Disease

## 2018-01-24 VITALS — BP 124/82 | HR 86 | Ht 61.0 in | Wt 291.8 lb

## 2018-01-24 DIAGNOSIS — Z79899 Other long term (current) drug therapy: Secondary | ICD-10-CM

## 2018-01-24 DIAGNOSIS — R600 Localized edema: Secondary | ICD-10-CM

## 2018-01-24 DIAGNOSIS — R002 Palpitations: Secondary | ICD-10-CM | POA: Diagnosis not present

## 2018-01-24 DIAGNOSIS — I5032 Chronic diastolic (congestive) heart failure: Secondary | ICD-10-CM

## 2018-01-24 MED ORDER — TORSEMIDE 20 MG PO TABS: 40.0000 mg | ORAL_TABLET | Freq: Every day | ORAL | 6 refills | Status: DC

## 2018-01-24 NOTE — Patient Instructions (Addendum)
Medication Instructions:   Hold the lasix Start torsemide 40 mg daily  You may need 20 mg after weight loss from fluid  Labwork:  BMP in one month  Testing/Procedures:  No further testing at this time   Follow-Up: It was a pleasure seeing you in the office today. Please call us if you have new issues that need to be addressed before your next appt.  [phone]  Your physician wants you to follow-up in:  As needed  If you need a refill on your cardiac medications before your next appointment, please call your pharmacy.  For educational health videos Log in to : [email or web address] Or : [email or web address], password : [email or web address]

## 2018-01-24 NOTE — Progress Notes (Signed)
Cardiology Office Note  Date:  01/24/2018   ID:  [name], DOB Jun 20, 1985, MRN [id number]  PCP:  Margaretann Loveless, [name]   Chief Complaint  Patient presents with  . New Patient (Initial Visit)    Fluid retention Left worse than right with ocasional SOB and arms and hands with occasional tingling.Medications verbally reviewed.     HPI:  Ms. Shterna Fultz is a 32 year old woman with past medical history of Smoker Reactive airway disease/asthma Morbid obesity Anxiety OSA Referred by [name] for consultation of her fluid retention/leg swelling  Previous stress echo September 2017 Prior echocardiogram 2017 Records were requested and reviewed Stress test done by Dr. Welton Flakes January 19, 2016 showing normal stress test breast attenuation artifact  CT scan chest was performed showing calcium score of 0 normal coronary arteries  Echocardiogram January 26, 2016 showing normal LV function normal right heart pressures mild TR mildly dilated left atrium Diastolic parameters normal  Previous Holter done through [facility] showing normal sinus rhythm APCs PVCs  On Lasix 3 times a day daily for chronic leg swelling   In the emergency room [facility] 11/13/2017 with shortness of breath Chest x-ray was unremarkable Lab work normal D-dimer negative  Palpitations, does not last very long  Lab work reviewed Creatinine 0.76 BUN 12 Hemoglobin A1c 4.9 LDL 78  EKG personally reviewed by myself on todays visit Shows normal sinus rhythm rate 86 bpm no significant ST or T wave changes   PMH:   has a past medical history of Anxiety, Depression, Elevated blood pressure, GERD (gastroesophageal reflux disease), and Sleep apnea.  PSH:    Past Surgical History:  Procedure Laterality Date  . CESAREAN SECTION     at 28 weeks for pre-eclampsia, pulmonary edema, gestational DM.  Marland Kitchen dilationand curettage of Uterus  2007   SAB  . Headache:2009     Headache wellness consult. during  pregnancy    Current Outpatient Medications  Medication Sig Dispense Refill  . albuterol (PROAIR HFA) 108 (90 Base) MCG/ACT inhaler Inhale 1-2 puffs into the lungs every 6 (six) hours as needed for wheezing or shortness of breath. 18 g 11  . fluticasone furoate-vilanterol (BREO ELLIPTA) 100-25 MCG/INH AEPB Inhale 1 puff into the lungs daily. 60 each 11  . medroxyPROGESTERone (DEPO-PROVERA) 150 MG/ML injection Inject 1 mL (150 mg total) into the muscle once for 1 dose. 1 mL 0  . metroNIDAZOLE (METROGEL) 0.75 % vaginal gel Place 1 Applicatorful vaginally 2 (two) times daily. 70 g 0  . montelukast (SINGULAIR) 10 MG tablet TAKE 1 TABLET BY MOUTH AT  BEDTIME 90 tablet 1  . nystatin cream (MYCOSTATIN) Apply 1 application topically 2 (two) times daily. 30 g 0  . omeprazole (PRILOSEC) 40 MG capsule Take 1 capsule (40 mg total) daily by mouth. 30 capsule 3  . potassium chloride (K-DUR) 10 MEQ tablet Take 1 tablet (10 mEq total) by mouth daily. 90 tablet 1  . Vitamin D, Ergocalciferol, (DRISDOL) 50000 units CAPS capsule Take 1 capsule (50,000 Units total) by mouth every 7 (seven) days. 12 capsule 1  . torsemide (DEMADEX) 20 MG tablet Take 2 tablets (40 mg total) by mouth daily. 60 tablet 6   No current facility-administered medications for this visit.      Allergies:   Lactose intolerance (gi); Penicillins; and Pineapple   Social History:  The patient  reports that she has been smoking cigars. She has never used smokeless tobacco. She reports that she drinks alcohol. She reports that  she does not use drugs.   Family History:   family history includes Hypertension in her maternal grandmother.    Review of Systems: Review of Systems  Constitutional: Negative.   Respiratory: Negative.   Cardiovascular: Negative.   Gastrointestinal: Negative.   Musculoskeletal: Negative.   Neurological: Negative.   Psychiatric/Behavioral: Negative.   All other systems reviewed and are negative.    PHYSICAL  EXAM: VS:  BP 124/82 (BP Location: Right Arm, Patient Position: Sitting, Cuff Size: Large)   Pulse 86   Ht 5\' 1"  (1.549 m)   Wt 291 lb 12 oz (132.3 kg)   BMI 55.13 kg/m  , BMI Body mass index is 55.13 kg/m. GEN: Well nourished, well developed, in no acute distress , obese HEENT: normal  Neck: no JVD, carotid bruits, or masses Cardiac: RRR; no murmurs, rubs, or gallops,no edema  Respiratory:  clear to auscultation bilaterally, normal work of breathing GI: soft, nontender, nondistended, + BS MS: no deformity or atrophy  Skin: warm and dry, no rash Neuro:  Strength and sensation are intact Psych: euthymic mood, full affect   Recent Labs: 07/24/2017: ALT 15; BUN 12; Creatinine, Ser 0.76; Hemoglobin 12.5; Platelets 275; Potassium 4.1; Sodium 140; TSH 2.690    Lipid Panel Lab Results  Component Value Date   CHOL 150 07/24/2017   HDL 62 07/24/2017   LDLCALC 78 07/24/2017   TRIG 51 07/24/2017      Wt Readings from Last 3 Encounters:  01/24/18 291 lb 12 oz (132.3 kg)  12/28/17 286 lb (129.7 kg)  11/16/17 283 lb (128.4 kg)     ASSESSMENT AND PLAN:  Chronic diastolic CHF (congestive heart failure) (HCC) Fluid retention secondary to poorly controlled sleep apnea, high fluid intake, morbid obesity Recommended she change her Lasix to torsemide 20 mg up to 40 mg daily as needed for leg swelling She reports Lasix does not work as well anymore She does have very high fluid intake and we have recommended she try to curb her volume Reports drinking sometimes up to 1 gallon a day Previous echocardiogram was essentially normal, no repeat testing needed  Morbid obesity (HCC) Trying various modalities to lose weight Recommended low carbohydrate, avoid soda and sweet tea, breads and potatoes Dietary guide provided  Palpitations - Previous Holter monitor showing APCs and PVCs Minimally symptomatic If needed to take beta-blocker such as propranolol as needed for palpitations  Leg  edema Exacerbated by morbid obesity, sleep apnea, high fluid intake She prefers something stronger than Lasix We will change to torsemide as detailed above  Disposition:   F/U as needed  Patient was seen in consultation for [facility] and will be referred back to her office for ongoing care of the issues detailed above   Total encounter time more than 60 minutes  Greater than 50% was spent in counseling and coordination of care with the patient    Orders Placed This Encounter  Procedures  . Basic metabolic panel  . EKG 12-Lead     Signed, Dossie Arbour, M.D., Ph.D. 01/24/2018  [facility] [place], [place] [phone]

## 2018-01-29 ENCOUNTER — Encounter: Payer: Self-pay | Admitting: Physician Assistant

## 2018-01-31 ENCOUNTER — Telehealth: Payer: Self-pay | Admitting: Physician Assistant

## 2018-01-31 NOTE — Telephone Encounter (Signed)
Pt is requesting call back to discuss status update of her FMLA forms. Please advise. Thanks TNP

## 2018-02-19 ENCOUNTER — Ambulatory Visit: Payer: Managed Care, Other (non HMO) | Admitting: Physician Assistant

## 2018-02-19 ENCOUNTER — Encounter: Payer: Self-pay | Admitting: Physician Assistant

## 2018-02-19 VITALS — BP 122/80 | HR 78 | Temp 98.5°F | Resp 16 | Wt 293.8 lb

## 2018-02-19 DIAGNOSIS — F320 Major depressive disorder, single episode, mild: Secondary | ICD-10-CM | POA: Diagnosis not present

## 2018-02-19 DIAGNOSIS — Z3042 Encounter for surveillance of injectable contraceptive: Secondary | ICD-10-CM

## 2018-02-19 DIAGNOSIS — Z713 Dietary counseling and surveillance: Secondary | ICD-10-CM

## 2018-02-19 DIAGNOSIS — Z6841 Body Mass Index (BMI) 40.0 and over, adult: Secondary | ICD-10-CM

## 2018-02-19 LAB — POCT URINE PREGNANCY: Preg Test, Ur: NEGATIVE

## 2018-02-19 MED ORDER — MEDROXYPROGESTERONE ACETATE 150 MG/ML IM SUSP
150.0000 mg | Freq: Once | INTRAMUSCULAR | Status: AC
  Administered 2018-02-19: 150 mg via INTRAMUSCULAR

## 2018-02-19 MED ORDER — PHENTERMINE HCL 37.5 MG PO TABS: 37.5000 mg | ORAL_TABLET | Freq: Every day | ORAL | 2 refills | Status: DC

## 2018-02-19 MED ORDER — FLUOXETINE HCL 20 MG PO TABS: 20.0000 mg | ORAL_TABLET | Freq: Every day | ORAL | 1 refills | Status: DC

## 2018-02-19 NOTE — Patient Instructions (Signed)
Fluoxetine capsules or tablets (Depression/Mood Disorders) What is this medicine? FLUOXETINE (floo OX e teen) belongs to a class of drugs known as selective serotonin reuptake inhibitors (SSRIs). It helps to treat mood problems such as depression, obsessive compulsive disorder, and panic attacks. It can also treat certain eating disorders. This medicine may be used for other purposes; ask your health care provider or pharmacist if you have questions. COMMON BRAND NAME(S): Prozac What should I tell my health care provider before I take this medicine? They need to know if you have any of these conditions: -bipolar disorder or a family history of bipolar disorder -bleeding disorders -glaucoma -heart disease -liver disease -low levels of sodium in the blood -seizures -suicidal thoughts, plans, or attempt; a previous suicide attempt by you or a family member -take MAOIs like Carbex, Eldepryl, Marplan, Nardil, and Parnate -take medicines that treat or prevent blood clots -thyroid disease -an unusual or allergic reaction to fluoxetine, other medicines, foods, dyes, or preservatives -pregnant or trying to get pregnant -breast-feeding How should I use this medicine? Take this medicine by mouth with a glass of water. Follow the directions on the prescription label. You can take this medicine with or without food. Take your medicine at regular intervals. Do not take it more often than directed. Do not stop taking this medicine suddenly except upon the advice of your doctor. Stopping this medicine too quickly may cause serious side effects or your condition may worsen. A special MedGuide will be given to you by the pharmacist with each prescription and refill. Be sure to read this information carefully each time. Talk to your pediatrician regarding the use of this medicine in children. While this drug may be prescribed for children as young as 7 years for selected conditions, precautions do  apply. Overdosage: If you think you have taken too much of this medicine contact a poison control center or emergency room at once. NOTE: This medicine is only for you. Do not share this medicine with others. What if I miss a dose? If you miss a dose, skip the missed dose and go back to your regular dosing schedule. Do not take double or extra doses. What may interact with this medicine? Do not take this medicine with any of the following medications: -other medicines containing fluoxetine, like Sarafem or Symbyax -cisapride -linezolid -MAOIs like Carbex, Eldepryl, Marplan, Nardil, and Parnate -methylene blue (injected into a vein) -pimozide -thioridazine This medicine may also interact with the following medications: -alcohol -amphetamines -aspirin and aspirin-like medicines -carbamazepine -certain medicines for depression, anxiety, or psychotic disturbances -certain medicines for migraine headaches like almotriptan, eletriptan, frovatriptan, naratriptan, rizatriptan, sumatriptan, zolmitriptan -digoxin -diuretics -fentanyl -flecainide -furazolidone -isoniazid -lithium -medicines for sleep -medicines that treat or prevent blood clots like warfarin, enoxaparin, and dalteparin -NSAIDs, medicines for pain and inflammation, like ibuprofen or naproxen -phenytoin -procarbazine -propafenone -rasagiline -ritonavir -supplements like St. John's wort, kava kava, valerian -tramadol -tryptophan -vinblastine This list may not describe all possible interactions. Give your health care provider a list of all the medicines, herbs, non-prescription drugs, or dietary supplements you use. Also tell them if you smoke, drink alcohol, or use illegal drugs. Some items may interact with your medicine. What should I watch for while using this medicine? Tell your doctor if your symptoms do not get better or if they get worse. Visit your doctor or health care professional for regular checks on your  progress. Because it may take several weeks to see the full effects of this medicine, it   is important to continue your treatment as prescribed by your doctor. Patients and their families should watch out for new or worsening thoughts of suicide or depression. Also watch out for sudden changes in feelings such as feeling anxious, agitated, panicky, irritable, hostile, aggressive, impulsive, severely restless, overly excited and hyperactive, or not being able to sleep. If this happens, especially at the beginning of treatment or after a change in dose, call your health care professional. You may get drowsy or dizzy. Do not drive, use machinery, or do anything that needs mental alertness until you know how this medicine affects you. Do not stand or sit up quickly, especially if you are an older patient. This reduces the risk of dizzy or fainting spells. Alcohol may interfere with the effect of this medicine. Avoid alcoholic drinks. Your mouth may get dry. Chewing sugarless gum or sucking hard candy, and drinking plenty of water may help. Contact your doctor if the problem does not go away or is severe. This medicine may affect blood sugar levels. If you have diabetes, check with your doctor or health care professional before you change your diet or the dose of your diabetic medicine. What side effects may I notice from receiving this medicine? Side effects that you should report to your doctor or health care professional as soon as possible: -allergic reactions like skin rash, itching or hives, swelling of the face, lips, or tongue -anxious -black, tarry stools -breathing problems -changes in vision -confusion -elevated mood, decreased need for sleep, racing thoughts, impulsive behavior -eye pain -fast, irregular heartbeat -feeling faint or lightheaded, falls -feeling agitated, angry, or irritable -hallucination, loss of contact with reality -loss of balance or coordination -loss of memory -painful  or prolonged erections -restlessness, pacing, inability to keep still -seizures -stiff muscles -suicidal thoughts or other mood changes -trouble sleeping -unusual bleeding or bruising -unusually weak or tired -vomiting Side effects that usually do not require medical attention (report to your doctor or health care professional if they continue or are bothersome): -change in appetite or weight -change in sex drive or performance -diarrhea -dry mouth -headache -increased sweating -nausea -tremors This list may not describe all possible side effects. Call your doctor for medical advice about side effects. You may report side effects to FDA at 1-800-FDA-1088. Where should I keep my medicine? Keep out of the reach of children. Store at room temperature between 15 and 30 degrees C (59 and 86 degrees F). Throw away any unused medicine after the expiration date. NOTE: This sheet is a summary. It may not cover all possible information. If you have questions about this medicine, talk to your doctor, pharmacist, or health care provider.  2018 Elsevier/Gold Standard (2015-10-10 15:55:27)  

## 2018-02-19 NOTE — Progress Notes (Signed)
Patient: [name] Female    DOB: [birth year]   32 y.o.   MRN: [id number] Visit Date: 02/19/2018  Today's Provider: Margaretann Loveless, [name]   Chief Complaint  Patient presents with  . Follow-up    weight loss conseling and Depo provera   Subjective:    HPI  Patient here to follow-up on weight loss.Reports that she is not exercising. She reports that she is trying to drink more water and tries to snack on fruit. She also reports that she is not taking the phentermine. She took a 5 month break from the medicine.   Also has complaints of worsening depressed mood and mood lability. Having moments of wanting to cry, then being very angry, then can be happy. Has been worsening over the last 3 months.   Patient also here for Depo provera contraception management.Last injection was 11/16/2017 it was given on the right side.Patient getting Depo today and due to return Dec 16-Dec 30.    Allergies  Allergen Reactions  . Lactose Intolerance (Gi) Diarrhea  . Penicillins Hives    Has patient had a PCN reaction causing immediate rash, facial/tongue/throat swelling, SOB or lightheadedness with hypotension: Yes Has patient had a PCN reaction causing severe rash involving mucus membranes or skin necrosis: No Has patient had a PCN reaction that required hospitalization No Has patient had a PCN reaction occurring within the last 10 years: Yes If all of the above answers are "NO", then may proceed with Cephalosporin use.  Marland Kitchen Pineapple Other (See Comments)    Patient states this cuts my mouth and makes it bleed.     Current Outpatient Medications:  .  albuterol (PROAIR HFA) 108 (90 Base) MCG/ACT inhaler, Inhale 1-2 puffs into the lungs every 6 (six) hours as needed for wheezing or shortness of breath., Disp: 18 g, Rfl: 11 .  fluticasone furoate-vilanterol (BREO ELLIPTA) 100-25 MCG/INH AEPB, Inhale 1 puff into the lungs daily., Disp: 60 each, Rfl: 11 .  montelukast (SINGULAIR) 10 MG  tablet, TAKE 1 TABLET BY MOUTH AT  BEDTIME, Disp: 90 tablet, Rfl: 1 .  nystatin cream (MYCOSTATIN), Apply 1 application topically 2 (two) times daily., Disp: 30 g, Rfl: 0 .  omeprazole (PRILOSEC) 40 MG capsule, Take 1 capsule (40 mg total) daily by mouth., Disp: 30 capsule, Rfl: 3 .  potassium chloride (K-DUR) 10 MEQ tablet, Take 1 tablet (10 mEq total) by mouth daily., Disp: 90 tablet, Rfl: 1 .  torsemide (DEMADEX) 20 MG tablet, Take 2 tablets (40 mg total) by mouth daily., Disp: 60 tablet, Rfl: 6 .  medroxyPROGESTERone (DEPO-PROVERA) 150 MG/ML injection, Inject 1 mL (150 mg total) into the muscle once for 1 dose., Disp: 1 mL, Rfl: 0 .  metroNIDAZOLE (METROGEL) 0.75 % vaginal gel, Place 1 Applicatorful vaginally 2 (two) times daily. (Patient not taking: Reported on 02/19/2018), Disp: 70 g, Rfl: 0 .  Vitamin D, Ergocalciferol, (DRISDOL) 50000 units CAPS capsule, Take 1 capsule (50,000 Units total) by mouth every 7 (seven) days. (Patient not taking: Reported on 02/19/2018), Disp: 12 capsule, Rfl: 1  Review of Systems  Constitutional: Negative.   HENT: Negative.   Respiratory: Negative.   Cardiovascular: Negative.   Gastrointestinal: Negative.   Neurological: Negative.     Social History   Tobacco Use  . Smoking status: Current Some Day Smoker    Types: Cigars  . Smokeless tobacco: Never Used  . Tobacco comment: smokes cigars  Substance Use Topics  . Alcohol  use: Yes    Alcohol/week: 0.0 standard drinks    Comment: ocassional alcohol use; Mixed drinks once every 2-3 months   Objective:   BP 122/80 (BP Location: Left Wrist, Patient Position: Sitting, Cuff Size: Normal)   Pulse 78   Temp 98.5 F (36.9 C) (Oral)   Resp 16   Wt 293 lb 12.8 oz (133.3 kg)   BMI 55.51 kg/m  Vitals:   02/19/18 0809  BP: 122/80  Pulse: 78  Resp: 16  Temp: 98.5 F (36.9 C)  TempSrc: Oral  Weight: 293 lb 12.8 oz (133.3 kg)     Physical Exam  Constitutional: She appears well-developed and  well-nourished. No distress.  HENT:  Head: Normocephalic and atraumatic.  Neck: Normal range of motion. Neck supple.  Cardiovascular: Normal rate, regular rhythm and normal heart sounds. Exam reveals no gallop and no friction rub.  No murmur heard. Pulmonary/Chest: Effort normal and breath sounds normal. No respiratory distress. She has no wheezes. She has no rales.  Skin: She is not diaphoretic.  Psychiatric: Her behavior is normal. Judgment and thought content normal. She exhibits a depressed mood.  Vitals reviewed.       Assessment & Plan:     1. Encounter for surveillance of injectable contraceptive Urine pregnancy negative. Depo given without complication today. Return in 3 months.  - medroxyPROGESTERone (DEPO-PROVERA) injection 150 mg - POCT urine pregnancy  2. Depression, major, single episode, mild (HCC) Worsening. Will start prozac as below. I will see her back in 4 weeks to see how she is doing.  - FLUoxetine (PROZAC) 20 MG tablet; Take 1 tablet (20 mg total) by mouth daily.  Dispense: 30 tablet; Refill: 1  3. Encounter for weight loss counseling Restart phentermine as below. Will recheck weight in 4 weeks. Discussed referral to bariatric surgery. Will re-evaluate in 6 months to see if patient desires referral at that time.  - phentermine (ADIPEX-P) 37.5 MG tablet; Take 1 tablet (37.5 mg total) by mouth daily before breakfast.  Dispense: 30 tablet; Refill: 2  4. Morbid obesity with body mass index (BMI) of 50.0 to 59.9 in adult [facility]) See above medical treatment plan. - phentermine (ADIPEX-P) 37.5 MG tablet; Take 1 tablet (37.5 mg total) by mouth daily before breakfast.  Dispense: 30 tablet; Refill: 2       Margaretann Loveless, [name]  [facility]

## 2018-03-05 ENCOUNTER — Encounter: Payer: Self-pay | Admitting: Physician Assistant

## 2018-03-05 DIAGNOSIS — J069 Acute upper respiratory infection, unspecified: Secondary | ICD-10-CM

## 2018-03-05 DIAGNOSIS — B9789 Other viral agents as the cause of diseases classified elsewhere: Principal | ICD-10-CM

## 2018-03-08 MED ORDER — BENZONATATE 200 MG PO CAPS: 200.0000 mg | ORAL_CAPSULE | Freq: Three times a day (TID) | ORAL | 0 refills | Status: DC | PRN

## 2018-03-08 MED ORDER — PREDNISONE 10 MG (21) PO TBPK: ORAL_TABLET | ORAL | 0 refills | Status: DC

## 2018-03-08 NOTE — Addendum Note (Signed)
Addended by: Margaretann Loveless on: 03/08/2018 01:52 PM   Modules accepted: Orders

## 2018-03-26 ENCOUNTER — Ambulatory Visit: Payer: Self-pay | Admitting: Physician Assistant

## 2018-03-26 NOTE — Progress Notes (Deleted)
Patient: [name] Female    DOB: 08/21/1985   32 y.o.   MRN: [id number] Visit Date: 03/26/2018  Today's Provider: Margaretann Loveless, [name]   No chief complaint on file.  Subjective:    HPI  Weight Loss Counseling, Follow up:  The patient was last seen this 4 weeks ago. Changes made since that visit include Re-start Phentermine.  She reports {excellent/good/fair/poor:19665} compliance with treatment. She {ACTION; IS/IS [id number]} having side effects. ***.  ------------------------------------------------------------------------  Depression, Follow up:  The patient was last seen for Depression 4 weeks ago. Changes made since that visit include Start Prozac.  She reports {excellent/good/fair/poor:19665} compliance with treatment. She {ACTION; IS/IS [id number]} having side effects. ***. ------------------------------------------------------------------------       Allergies  Allergen Reactions  . Lactose Intolerance (Gi) Diarrhea  . Penicillins Hives    Has patient had a PCN reaction causing immediate rash, facial/tongue/throat swelling, SOB or lightheadedness with hypotension: Yes Has patient had a PCN reaction causing severe rash involving mucus membranes or skin necrosis: No Has patient had a PCN reaction that required hospitalization No Has patient had a PCN reaction occurring within the last 10 years: Yes If all of the above answers are "NO", then may proceed with Cephalosporin use.  Marland Kitchen Pineapple Other (See Comments)    Patient states this cuts my mouth and makes it bleed.     Current Outpatient Medications:  .  albuterol (PROAIR HFA) 108 (90 Base) MCG/ACT inhaler, Inhale 1-2 puffs into the lungs every 6 (six) hours as needed for wheezing or shortness of breath., Disp: 18 g, Rfl: 11 .  benzonatate (TESSALON) 200 MG capsule, Take 1 capsule (200 mg total) by mouth 3 (three) times daily as needed for cough., Disp: 30 capsule, Rfl: 0 .  FLUoxetine  (PROZAC) 20 MG tablet, Take 1 tablet (20 mg total) by mouth daily., Disp: 30 tablet, Rfl: 1 .  fluticasone furoate-vilanterol (BREO ELLIPTA) 100-25 MCG/INH AEPB, Inhale 1 puff into the lungs daily., Disp: 60 each, Rfl: 11 .  medroxyPROGESTERone (DEPO-PROVERA) 150 MG/ML injection, Inject 1 mL (150 mg total) into the muscle once for 1 dose., Disp: 1 mL, Rfl: 0 .  metroNIDAZOLE (METROGEL) 0.75 % vaginal gel, Place 1 Applicatorful vaginally 2 (two) times daily. (Patient not taking: Reported on 02/19/2018), Disp: 70 g, Rfl: 0 .  montelukast (SINGULAIR) 10 MG tablet, TAKE 1 TABLET BY MOUTH AT  BEDTIME, Disp: 90 tablet, Rfl: 1 .  nystatin cream (MYCOSTATIN), Apply 1 application topically 2 (two) times daily., Disp: 30 g, Rfl: 0 .  omeprazole (PRILOSEC) 40 MG capsule, Take 1 capsule (40 mg total) daily by mouth., Disp: 30 capsule, Rfl: 3 .  phentermine (ADIPEX-P) 37.5 MG tablet, Take 1 tablet (37.5 mg total) by mouth daily before breakfast., Disp: 30 tablet, Rfl: 2 .  potassium chloride (K-DUR) 10 MEQ tablet, Take 1 tablet (10 mEq total) by mouth daily., Disp: 90 tablet, Rfl: 1 .  predniSONE (STERAPRED UNI-PAK 21 TAB) 10 MG (21) TBPK tablet, 6 day taper; Take as directed on package instructions, Disp: 21 tablet, Rfl: 0 .  torsemide (DEMADEX) 20 MG tablet, Take 2 tablets (40 mg total) by mouth daily., Disp: 60 tablet, Rfl: 6 .  Vitamin D, Ergocalciferol, (DRISDOL) 50000 units CAPS capsule, Take 1 capsule (50,000 Units total) by mouth every 7 (seven) days. (Patient not taking: Reported on 02/19/2018), Disp: 12 capsule, Rfl: 1  Review of Systems  Social History   Tobacco Use  . Smoking  status: Current Some Day Smoker    Types: Cigars  . Smokeless tobacco: Never Used  . Tobacco comment: smokes cigars  Substance Use Topics  . Alcohol use: Yes    Alcohol/week: 0.0 standard drinks    Comment: ocassional alcohol use; Mixed drinks once every 2-3 months   Objective:   There were no vitals taken for this  visit. There were no vitals filed for this visit.   Physical Exam      Assessment & Plan:           Margaretann Loveless, [name]  [facility]

## 2018-03-27 ENCOUNTER — Encounter: Payer: Self-pay | Admitting: Physician Assistant

## 2018-04-02 ENCOUNTER — Telehealth: Payer: Self-pay | Admitting: Physician Assistant

## 2018-04-02 ENCOUNTER — Other Ambulatory Visit: Payer: Self-pay | Admitting: Physician Assistant

## 2018-04-02 ENCOUNTER — Telehealth: Payer: Self-pay

## 2018-04-02 DIAGNOSIS — B9689 Other specified bacterial agents as the cause of diseases classified elsewhere: Secondary | ICD-10-CM

## 2018-04-02 DIAGNOSIS — B379 Candidiasis, unspecified: Secondary | ICD-10-CM

## 2018-04-02 DIAGNOSIS — N760 Acute vaginitis: Secondary | ICD-10-CM

## 2018-04-02 DIAGNOSIS — J069 Acute upper respiratory infection, unspecified: Secondary | ICD-10-CM

## 2018-04-02 DIAGNOSIS — T3695XA Adverse effect of unspecified systemic antibiotic, initial encounter: Principal | ICD-10-CM

## 2018-04-02 MED ORDER — METRONIDAZOLE 0.75 % VA GEL: 1.0000 | Freq: Two times a day (BID) | VAGINAL | 0 refills | Status: DC

## 2018-04-02 MED ORDER — FLUCONAZOLE 150 MG PO TABS: 150.0000 mg | ORAL_TABLET | Freq: Once | ORAL | 0 refills | Status: AC

## 2018-04-02 MED ORDER — DOXYCYCLINE HYCLATE 100 MG PO TABS: 100.0000 mg | ORAL_TABLET | Freq: Two times a day (BID) | ORAL | 0 refills | Status: DC

## 2018-04-02 NOTE — Telephone Encounter (Signed)
Doxycycline sent in. She will need to schedule an appt if not improving.

## 2018-04-02 NOTE — Telephone Encounter (Signed)
Pt able to swallow but she will has to swallow extra to be able to swallow.  Coughing up mucus.   She was asked to call back to let [facility] know.  Please advise.  Thanks, [employer]

## 2018-04-02 NOTE — Telephone Encounter (Signed)
Sent in

## 2018-04-02 NOTE — Telephone Encounter (Signed)
Patient requesting a Rx for Diflucan. [facility]

## 2018-04-02 NOTE — Telephone Encounter (Signed)
Patient advised as directed below. 

## 2018-05-03 ENCOUNTER — Encounter: Payer: Self-pay | Admitting: Physician Assistant

## 2018-05-03 DIAGNOSIS — T3695XA Adverse effect of unspecified systemic antibiotic, initial encounter: Principal | ICD-10-CM

## 2018-05-03 DIAGNOSIS — B379 Candidiasis, unspecified: Secondary | ICD-10-CM

## 2018-05-04 MED ORDER — FLUCONAZOLE 150 MG PO TABS: 150.0000 mg | ORAL_TABLET | Freq: Every day | ORAL | 0 refills | Status: DC

## 2018-05-09 ENCOUNTER — Ambulatory Visit: Payer: Managed Care, Other (non HMO) | Admitting: Physician Assistant

## 2018-05-09 ENCOUNTER — Encounter: Payer: Self-pay | Admitting: Physician Assistant

## 2018-05-09 VITALS — BP 155/66 | HR 51 | Temp 98.7°F | Resp 16 | Ht 60.0 in | Wt 288.0 lb

## 2018-05-09 DIAGNOSIS — Z3042 Encounter for surveillance of injectable contraceptive: Secondary | ICD-10-CM | POA: Diagnosis not present

## 2018-05-09 DIAGNOSIS — B351 Tinea unguium: Secondary | ICD-10-CM

## 2018-05-09 DIAGNOSIS — Z713 Dietary counseling and surveillance: Secondary | ICD-10-CM

## 2018-05-09 DIAGNOSIS — Z6841 Body Mass Index (BMI) 40.0 and over, adult: Secondary | ICD-10-CM

## 2018-05-09 MED ORDER — MEDROXYPROGESTERONE ACETATE 150 MG/ML IM SUSP
150.0000 mg | Freq: Once | INTRAMUSCULAR | Status: AC
  Administered 2018-05-09: 150 mg via INTRAMUSCULAR

## 2018-05-09 MED ORDER — PHENTERMINE HCL 37.5 MG PO TABS: 37.5000 mg | ORAL_TABLET | Freq: Every day | ORAL | 2 refills | Status: DC

## 2018-05-09 NOTE — Progress Notes (Signed)
Patient: [name] Female    DOB: [birth year]   32 y.o.   MRN: [id number] Visit Date: 05/09/2018  Today's Provider: Margaretann Loveless, [name]   Chief Complaint  Patient presents with  . Obesity  . Contraception   Subjective:     HPI   Follow up for weight  The patient was last seen for this 6 months ago. Changes made at last visit include no changes.  She reports excellent compliance with treatment. She feels that condition is Improved. She is not having side effects.   Wt Readings from Last 3 Encounters:  05/09/18 288 lb (130.6 kg)  02/19/18 293 lb 12.8 oz (133.3 kg)  01/24/18 291 lb 12 oz (132.3 kg)   ------------------------------------------------------------------------------------     Allergies  Allergen Reactions  . Lactose Intolerance (Gi) Diarrhea  . Penicillins Hives    Has patient had a PCN reaction causing immediate rash, facial/tongue/throat swelling, SOB or lightheadedness with hypotension: Yes Has patient had a PCN reaction causing severe rash involving mucus membranes or skin necrosis: No Has patient had a PCN reaction that required hospitalization No Has patient had a PCN reaction occurring within the last 10 years: Yes If all of the above answers are "NO", then may proceed with Cephalosporin use.  Marland Kitchen Pineapple Other (See Comments)    Patient states this cuts my mouth and makes it bleed.     Current Outpatient Medications:  .  albuterol (PROAIR HFA) 108 (90 Base) MCG/ACT inhaler, Inhale 1-2 puffs into the lungs every 6 (six) hours as needed for wheezing or shortness of breath., Disp: 18 g, Rfl: 11 .  fluconazole (DIFLUCAN) 150 MG tablet, Take 1 tablet (150 mg total) by mouth daily., Disp: 3 tablet, Rfl: 0 .  FLUoxetine (PROZAC) 20 MG tablet, Take 1 tablet (20 mg total) by mouth daily., Disp: 30 tablet, Rfl: 1 .  fluticasone furoate-vilanterol (BREO ELLIPTA) 100-25 MCG/INH AEPB, Inhale 1 puff into the lungs daily., Disp: 60 each, Rfl:  11 .  metroNIDAZOLE (METROGEL) 0.75 % vaginal gel, Place 1 Applicatorful vaginally 2 (two) times daily., Disp: 70 g, Rfl: 0 .  montelukast (SINGULAIR) 10 MG tablet, TAKE 1 TABLET BY MOUTH AT  BEDTIME, Disp: 90 tablet, Rfl: 1 .  nystatin cream (MYCOSTATIN), Apply 1 application topically 2 (two) times daily., Disp: 30 g, Rfl: 0 .  omeprazole (PRILOSEC) 40 MG capsule, Take 1 capsule (40 mg total) daily by mouth., Disp: 30 capsule, Rfl: 3 .  phentermine (ADIPEX-P) 37.5 MG tablet, Take 1 tablet (37.5 mg total) by mouth daily before breakfast., Disp: 30 tablet, Rfl: 2 .  potassium chloride (K-DUR) 10 MEQ tablet, Take 1 tablet (10 mEq total) by mouth daily., Disp: 90 tablet, Rfl: 1 .  torsemide (DEMADEX) 20 MG tablet, Take 2 tablets (40 mg total) by mouth daily., Disp: 60 tablet, Rfl: 6 .  Vitamin D, Ergocalciferol, (DRISDOL) 50000 units CAPS capsule, Take 1 capsule (50,000 Units total) by mouth every 7 (seven) days., Disp: 12 capsule, Rfl: 1  Review of Systems  Constitutional: Negative.   Respiratory: Negative.   Cardiovascular: Negative.   Skin:       Toe nail on right great toe with some discoloration  Neurological: Negative.     Social History   Tobacco Use  . Smoking status: Current Some Day Smoker    Types: Cigars  . Smokeless tobacco: Never Used  . Tobacco comment: smokes cigars  Substance Use Topics  . Alcohol use: Yes  Alcohol/week: 0.0 standard drinks    Comment: ocassional alcohol use; Mixed drinks once every 2-3 months      Objective:   BP (!) 155/66 (BP Location: Left Arm, Patient Position: Sitting, Cuff Size: Large)   Pulse (!) 51   Temp 98.7 F (37.1 C) (Oral)   Resp 16   Ht 5' (1.524 m)   Wt 288 lb (130.6 kg)   BMI 56.25 kg/m  Vitals:   05/09/18 0815  BP: (!) 155/66  Pulse: (!) 51  Resp: 16  Temp: 98.7 F (37.1 C)  TempSrc: Oral  Weight: 288 lb (130.6 kg)  Height: 5' (1.524 m)     Physical Exam Vitals signs reviewed.  Constitutional:      General:  She is not in acute distress.    Appearance: She is well-developed. She is not diaphoretic.  Neck:     Musculoskeletal: Normal range of motion.  Cardiovascular:     Rate and Rhythm: Normal rate and regular rhythm.     Pulses:          Dorsalis pedis pulses are 2+ on the right side and 2+ on the left side.       Posterior tibial pulses are 2+ on the right side and 2+ on the left side.     Heart sounds: Normal heart sounds. No murmur. No friction rub. No gallop.   Pulmonary:     Effort: Pulmonary effort is normal. No respiratory distress.     Breath sounds: Normal breath sounds. No wheezing or rales.  Musculoskeletal:     Right foot: Normal range of motion. No deformity, bunion, Charcot foot, foot drop or prominent metatarsal heads.     Left foot: Normal range of motion. No deformity, bunion, Charcot foot, foot drop or prominent metatarsal heads.       Feet:  Feet:     Right foot:     Skin integrity: Skin integrity normal.     Left foot:     Skin integrity: Skin integrity normal.        Assessment & Plan    1. Encounter for surveillance of injectable contraceptive Depo provera given today without complication. Return in 3 months for next dose.  - medroxyPROGESTERone (DEPO-PROVERA) injection 150 mg  2. Encounter for weight loss counseling Doing well. Down 5 pounds. Continues to work on dietary habits with food diary. Going to start adding exercise in now. Continue phentermine as below. Recheck weight in 3 months. - phentermine (ADIPEX-P) 37.5 MG tablet; Take 1 tablet (37.5 mg total) by mouth daily before breakfast.  Dispense: 30 tablet; Refill: 2  3. Morbid obesity with body mass index (BMI) of 50.0 to 59.9 in adult [facility](HCC) See above medical treatment plan. - phentermine (ADIPEX-P) 37.5 MG tablet; Take 1 tablet (37.5 mg total) by mouth daily before breakfast.  Dispense: 30 tablet; Refill: 2  4. Onychomycosis Discussed trying OTC medications first, epsom salt soaks as well. Call  if worsening.      Margaretann LovelessJennifer M Kanylah Muench, [name]  [facility]

## 2018-06-11 ENCOUNTER — Encounter: Payer: Self-pay | Admitting: Physician Assistant

## 2018-06-13 ENCOUNTER — Encounter: Payer: Self-pay | Admitting: Physician Assistant

## 2018-06-13 ENCOUNTER — Ambulatory Visit: Payer: Managed Care, Other (non HMO) | Admitting: Physician Assistant

## 2018-06-13 VITALS — BP 131/82 | HR 98 | Temp 98.1°F | Resp 16 | Wt 297.0 lb

## 2018-06-13 DIAGNOSIS — N61 Mastitis without abscess: Secondary | ICD-10-CM

## 2018-06-13 MED ORDER — DOXYCYCLINE HYCLATE 100 MG PO TABS: 100.0000 mg | ORAL_TABLET | Freq: Two times a day (BID) | ORAL | 0 refills | Status: DC

## 2018-06-13 NOTE — Patient Instructions (Signed)
Mastitis    Mastitis is irritation and swelling (inflammation) in an area of the breast. It is often caused by an infection that occurs when germs (bacteria) enter the skin. This most often happens to breastfeeding mothers, but it can happen to other women too as well as some men.  Follow these instructions at home:  Medicines  · Take over-the-counter and prescription medicines only as told by your doctor.  · If you were prescribed an antibiotic medicine, take it as told by your doctor. Do not stop taking it even if you start to feel better.  General instructions  · Do not wear a tight or underwire bra. Wear a soft support bra.  · Drink more fluids, especially if you have a fever.  · Get plenty of rest.  If you are breastfeeding:    · Keep emptying your breasts by breastfeeding or by using a breast pump.  · Keep your nipples clean and dry.  · During breastfeeding, empty the first breast before going to the other breast. Use a breast pump if your baby is not emptying your breasts.  · Massage your breasts during feeding or pumping as told by your doctor.  · If told, put moist heat on the affected area of your breast right before breastfeeding or pumping. Use the heat source that your doctor tells you to use.  · If told, put ice on the affected area of your breast right after breastfeeding or pumping:  ? Put ice in a plastic bag.  ? Place a towel between your skin and the bag.  ? Leave the ice on for 20 minutes.  · If you go back to work, pump your breasts while at work.  · Avoid letting your breasts get overly filled with milk (engorged).  Contact a doctor if:  · You have pus-like fluid leaking from your breast.  · You have a fever.  · Your symptoms do not get better within 2 days.  Get help right away if:  · Your pain and swelling are getting worse.  · Your pain is not helped by medicine.  · You have a red line going from your breast toward your armpit.  Summary  · Mastitis is irritation and swelling in an area of  the breast.  · If you were prescribed an antibiotic medicine, do not stop taking it even if you start to feel better.  · Drink more fluids and get plenty of rest.  · Contact a doctor if your symptoms do not get better within 2 days.  This information is not intended to replace advice given to you by your health care provider. Make sure you discuss any questions you have with your health care provider.  Document Released: 04/27/2009 Document Revised: 05/31/2016 Document Reviewed: 05/31/2016  Elsevier Interactive Patient Education © 2019 [employer]

## 2018-06-13 NOTE — Progress Notes (Signed)
Patient: [name] Female    DOB: 08-Sep-1985   33 y.o.   MRN: [id number] Visit Date: 06/13/2018  Today's Provider: Margaretann Loveless, [name]   Chief Complaint  Patient presents with  . Breast Problem   Subjective:     HPI   Patient states she noticed a knot on right breast 3 days ago. Patient reports warmth, redness and swelling of the right breast. No immediate family history of breast cancer (distant relative- maternal grandmohter's sister passed from breast cancer). Patient does have nipple piercings. Denies nipple discharge. No distinct mass.    Allergies  Allergen Reactions  . Lactose Intolerance (Gi) Diarrhea  . Penicillins Hives    Has patient had a PCN reaction causing immediate rash, facial/tongue/throat swelling, SOB or lightheadedness with hypotension: Yes Has patient had a PCN reaction causing severe rash involving mucus membranes or skin necrosis: No Has patient had a PCN reaction that required hospitalization No Has patient had a PCN reaction occurring within the last 10 years: Yes If all of the above answers are "NO", then may proceed with Cephalosporin use.  Marland Kitchen Pineapple Other (See Comments)    Patient states this cuts my mouth and makes it bleed.     Current Outpatient Medications:  .  albuterol (PROAIR HFA) 108 (90 Base) MCG/ACT inhaler, Inhale 1-2 puffs into the lungs every 6 (six) hours as needed for wheezing or shortness of breath., Disp: 18 g, Rfl: 11 .  FLUoxetine (PROZAC) 20 MG tablet, Take 1 tablet (20 mg total) by mouth daily., Disp: 30 tablet, Rfl: 1 .  fluticasone furoate-vilanterol (BREO ELLIPTA) 100-25 MCG/INH AEPB, Inhale 1 puff into the lungs daily., Disp: 60 each, Rfl: 11 .  metroNIDAZOLE (METROGEL) 0.75 % vaginal gel, Place 1 Applicatorful vaginally 2 (two) times daily., Disp: 70 g, Rfl: 0 .  montelukast (SINGULAIR) 10 MG tablet, TAKE 1 TABLET BY MOUTH AT  BEDTIME, Disp: 90 tablet, Rfl: 1 .  nystatin cream (MYCOSTATIN), Apply 1  application topically 2 (two) times daily., Disp: 30 g, Rfl: 0 .  omeprazole (PRILOSEC) 40 MG capsule, Take 1 capsule (40 mg total) daily by mouth., Disp: 30 capsule, Rfl: 3 .  phentermine (ADIPEX-P) 37.5 MG tablet, Take 1 tablet (37.5 mg total) by mouth daily before breakfast., Disp: 30 tablet, Rfl: 2 .  potassium chloride (K-DUR) 10 MEQ tablet, Take 1 tablet (10 mEq total) by mouth daily., Disp: 90 tablet, Rfl: 1 .  torsemide (DEMADEX) 20 MG tablet, Take 2 tablets (40 mg total) by mouth daily., Disp: 60 tablet, Rfl: 6 .  Vitamin D, Ergocalciferol, (DRISDOL) 50000 units CAPS capsule, Take 1 capsule (50,000 Units total) by mouth every 7 (seven) days., Disp: 12 capsule, Rfl: 1 .  fluconazole (DIFLUCAN) 150 MG tablet, Take 1 tablet (150 mg total) by mouth daily. (Patient not taking: Reported on 06/13/2018), Disp: 3 tablet, Rfl: 0  Review of Systems  Constitutional: Negative for appetite change, chills, fatigue and fever.  Respiratory: Negative for chest tightness and shortness of breath.   Cardiovascular: Negative for chest pain and palpitations.  Gastrointestinal: Negative for abdominal pain, nausea and vomiting.  Skin: Positive for color change and rash.  Neurological: Negative for dizziness and weakness.    Social History   Tobacco Use  . Smoking status: Current Some Day Smoker    Types: Cigars  . Smokeless tobacco: Never Used  . Tobacco comment: smokes cigars  Substance Use Topics  . Alcohol use: Yes  Alcohol/week: 0.0 standard drinks    Comment: ocassional alcohol use; Mixed drinks once every 2-3 months      Objective:   BP 131/82 (BP Location: Left Wrist, Patient Position: Sitting, Cuff Size: Large)   Pulse 98   Temp 98.1 F (36.7 C) (Oral)   Resp 16   Wt 297 lb (134.7 kg)   SpO2 98%   BMI 58.00 kg/m  Vitals:   06/13/18 1210  BP: 131/82  Pulse: 98  Resp: 16  Temp: 98.1 F (36.7 C)  TempSrc: Oral  SpO2: 98%  Weight: 297 lb (134.7 kg)     Physical Exam Vitals  signs reviewed.  Constitutional:      Appearance: She is well-developed.  HENT:     Head: Normocephalic and atraumatic.  Neck:     Musculoskeletal: Normal range of motion and neck supple.  Pulmonary:     Effort: Pulmonary effort is normal. No respiratory distress.  Chest:     Chest wall: Tenderness present. No mass.     Breasts:        Right: Swelling, skin change and tenderness present. No mass or nipple discharge.     Lymphadenopathy:     Upper Body:     Right upper body: No supraclavicular, axillary or pectoral adenopathy.  Psychiatric:        Behavior: Behavior normal.        Thought Content: Thought content normal.        Judgment: Judgment normal.         Assessment & Plan    1. Mastitis Suspect mastitis. Will treat with doxycycline as below. Continue warm compress. Call if no improvements and will get imaging (diagnostic mammogram and US of the R breast) to r/o inflammatory breast disease. - doxycycline (VIBRA-TABS) 100 MG tablet; Take 1 tablet (100 mg total) by mouth 2 (two) times daily.  Dispense: 20 tablet; Refill: 0     Margaretann Loveless, [name]  [facility]

## 2018-06-15 ENCOUNTER — Encounter: Payer: Self-pay | Admitting: Physician Assistant

## 2018-06-22 ENCOUNTER — Other Ambulatory Visit: Payer: Self-pay | Admitting: Physician Assistant

## 2018-06-22 ENCOUNTER — Encounter: Payer: Self-pay | Admitting: Physician Assistant

## 2018-06-22 ENCOUNTER — Telehealth: Payer: Self-pay | Admitting: Physician Assistant

## 2018-06-22 DIAGNOSIS — N644 Mastodynia: Secondary | ICD-10-CM

## 2018-06-22 DIAGNOSIS — N63 Unspecified lump in unspecified breast: Secondary | ICD-10-CM

## 2018-06-22 DIAGNOSIS — N6459 Other signs and symptoms in breast: Secondary | ICD-10-CM

## 2018-06-22 NOTE — Telephone Encounter (Signed)
Orders have been signed.

## 2018-06-22 NOTE — Telephone Encounter (Signed)
Per [facility] patient is due for bilateral mammogram TOMO [id number] and ultrasound needs to be breast ultrasound LIMITED instead of complete [id number]

## 2018-06-28 ENCOUNTER — Ambulatory Visit
Admission: RE | Admit: 2018-06-28 | Discharge: 2018-06-28 | Disposition: A | Discharge status: Home / Self Care | Payer: Managed Care, Other (non HMO) | Source: Ambulatory Visit | Attending: Physician Assistant | Admitting: Physician Assistant

## 2018-06-28 ENCOUNTER — Encounter: Payer: Self-pay | Admitting: Physician Assistant

## 2018-06-28 DIAGNOSIS — N6459 Other signs and symptoms in breast: Secondary | ICD-10-CM | POA: Insufficient documentation

## 2018-06-28 DIAGNOSIS — N644 Mastodynia: Secondary | ICD-10-CM | POA: Insufficient documentation

## 2018-06-28 DIAGNOSIS — R21 Rash and other nonspecific skin eruption: Secondary | ICD-10-CM

## 2018-06-28 DIAGNOSIS — N63 Unspecified lump in unspecified breast: Secondary | ICD-10-CM | POA: Diagnosis not present

## 2018-06-29 MED ORDER — MOMETASONE FUROATE 0.1 % EX CREA: 1.0000 "application " | TOPICAL_CREAM | Freq: Every day | CUTANEOUS | 0 refills | Status: DC

## 2018-07-10 ENCOUNTER — Telehealth: Payer: Self-pay | Admitting: Physician Assistant

## 2018-07-10 NOTE — Telephone Encounter (Signed)
Pt dropped off 3 sets of FMLA forms to be completed and faxed to [employer]. Forms placed in Jenni's box. Thanks TNP

## 2018-07-17 ENCOUNTER — Encounter: Payer: Self-pay | Admitting: Physician Assistant

## 2018-07-18 NOTE — Telephone Encounter (Signed)
See mychart messages

## 2018-07-25 NOTE — Telephone Encounter (Signed)
Pt called wanting to know if we have faxed her FMLA papers to [employer]  CB#  [phone]  Thanks Barth Kirks

## 2018-07-27 ENCOUNTER — Telehealth: Payer: Self-pay

## 2018-07-27 NOTE — Telephone Encounter (Signed)
These are completed and can be faxed today

## 2018-07-27 NOTE — Telephone Encounter (Signed)
Paperwork faxed. Copy left up front for pickup per patient request.

## 2018-07-27 NOTE — Telephone Encounter (Signed)
Patient called office to inquire about status of her FMLA paperwork. Patient is requesting a call back from CMA on her mobile/home number. Patient states its okay to leave a detailed voicemail message if she does not answer. KW

## 2018-08-08 ENCOUNTER — Other Ambulatory Visit: Payer: Self-pay

## 2018-08-08 ENCOUNTER — Encounter: Payer: Self-pay | Admitting: Physician Assistant

## 2018-08-08 ENCOUNTER — Ambulatory Visit: Payer: Managed Care, Other (non HMO) | Admitting: Physician Assistant

## 2018-08-08 VITALS — BP 122/80 | HR 109 | Temp 98.3°F | Resp 16 | Ht 61.0 in | Wt 297.4 lb

## 2018-08-08 DIAGNOSIS — M545 Low back pain, unspecified: Secondary | ICD-10-CM

## 2018-08-08 DIAGNOSIS — I5032 Chronic diastolic (congestive) heart failure: Secondary | ICD-10-CM | POA: Diagnosis not present

## 2018-08-08 DIAGNOSIS — Z713 Dietary counseling and surveillance: Secondary | ICD-10-CM

## 2018-08-08 DIAGNOSIS — Z113 Encounter for screening for infections with a predominantly sexual mode of transmission: Secondary | ICD-10-CM

## 2018-08-08 DIAGNOSIS — Z6841 Body Mass Index (BMI) 40.0 and over, adult: Secondary | ICD-10-CM

## 2018-08-08 DIAGNOSIS — Z3042 Encounter for surveillance of injectable contraceptive: Secondary | ICD-10-CM

## 2018-08-08 DIAGNOSIS — F320 Major depressive disorder, single episode, mild: Secondary | ICD-10-CM | POA: Insufficient documentation

## 2018-08-08 DIAGNOSIS — F325 Major depressive disorder, single episode, in full remission: Secondary | ICD-10-CM

## 2018-08-08 MED ORDER — MEDROXYPROGESTERONE ACETATE 150 MG/ML IM SUSP
150.0000 mg | Freq: Once | INTRAMUSCULAR | Status: AC
  Administered 2018-08-08: 150 mg via INTRAMUSCULAR

## 2018-08-08 MED ORDER — KETOROLAC TROMETHAMINE 60 MG/2ML IM SOLN
60.0000 mg | Freq: Once | INTRAMUSCULAR | Status: AC
  Administered 2018-08-08: 60 mg via INTRAMUSCULAR

## 2018-08-08 MED ORDER — PHENTERMINE HCL 37.5 MG PO TABS: 37.5000 mg | ORAL_TABLET | Freq: Every day | ORAL | 2 refills | Status: DC

## 2018-08-08 NOTE — Progress Notes (Signed)
Patient: [name] Female    DOB: August 21, 1985   33 y.o.   MRN: [id number] Visit Date: 08/08/2018  Today's Provider: Margaretann Loveless, [name]   Chief Complaint  Patient presents with  . Follow-up   Subjective:    I, Sulibeya S. Dimas, CMA, am acting as a Neurosurgeon for [employer], [name].  HPI  Follow up for weight  The patient was last seen for this 1 years ago. Changes made at last visit include no changes.  She reports fair compliance with treatment. Patient reports that she just restarted taking phentermine this month. Patient reports she had taken a two month break. She feels that condition is Unchanged. She is not having side effects.   Wt Readings from Last 3 Encounters:  08/08/18 297 lb 6.4 oz (134.9 kg)  06/13/18 297 lb (134.7 kg)  05/09/18 288 lb (130.6 kg)   ------------------------------------------------------------------------------------ Back pain:  Patient c/o mid lower back pain, worsening in the last few months. Patient reports she take tylenol 2 tablets BID. Patient reports she had been going to a chiropractor and reports that she is not getting any extra relief. Has been taking tylenol extra strength consistently without relief.   Contraception: Patient reports good tolerance with contraception injection. Here for 3 month depo provera injeection  Allergies  Allergen Reactions  . Lactose Intolerance (Gi) Diarrhea  . Penicillins Hives    Has patient had a PCN reaction causing immediate rash, facial/tongue/throat swelling, SOB or lightheadedness with hypotension: Yes Has patient had a PCN reaction causing severe rash involving mucus membranes or skin necrosis: No Has patient had a PCN reaction that required hospitalization No Has patient had a PCN reaction occurring within the last 10 years: Yes If all of the above answers are "NO", then may proceed with Cephalosporin use.  Marland Kitchen Pineapple Other (See Comments)    Patient states this cuts my  mouth and makes it bleed.     Current Outpatient Medications:  .  albuterol (PROAIR HFA) 108 (90 Base) MCG/ACT inhaler, Inhale 1-2 puffs into the lungs every 6 (six) hours as needed for wheezing or shortness of breath., Disp: 18 g, Rfl: 11 .  FLUoxetine (PROZAC) 20 MG tablet, Take 1 tablet (20 mg total) by mouth daily., Disp: 30 tablet, Rfl: 1 .  fluticasone furoate-vilanterol (BREO ELLIPTA) 100-25 MCG/INH AEPB, Inhale 1 puff into the lungs daily., Disp: 60 each, Rfl: 11 .  metroNIDAZOLE (METROGEL) 0.75 % vaginal gel, Place 1 Applicatorful vaginally 2 (two) times daily., Disp: 70 g, Rfl: 0 .  mometasone (ELOCON) 0.1 % cream, Apply 1 application topically daily., Disp: 45 g, Rfl: 0 .  montelukast (SINGULAIR) 10 MG tablet, TAKE 1 TABLET BY MOUTH AT  BEDTIME, Disp: 90 tablet, Rfl: 1 .  nystatin cream (MYCOSTATIN), Apply 1 application topically 2 (two) times daily., Disp: 30 g, Rfl: 0 .  omeprazole (PRILOSEC) 40 MG capsule, Take 1 capsule (40 mg total) daily by mouth., Disp: 30 capsule, Rfl: 3 .  phentermine (ADIPEX-P) 37.5 MG tablet, Take 1 tablet (37.5 mg total) by mouth daily before breakfast., Disp: 30 tablet, Rfl: 2 .  potassium chloride (K-DUR) 10 MEQ tablet, Take 1 tablet (10 mEq total) by mouth daily., Disp: 90 tablet, Rfl: 1 .  torsemide (DEMADEX) 20 MG tablet, Take 2 tablets (40 mg total) by mouth daily., Disp: 60 tablet, Rfl: 6 .  Vitamin D, Ergocalciferol, (DRISDOL) 50000 units CAPS capsule, Take 1 capsule (50,000 Units total) by mouth every  7 (seven) days., Disp: 12 capsule, Rfl: 1  Review of Systems  Constitutional: Negative.   Respiratory: Negative.   Cardiovascular: Negative.   Musculoskeletal: Positive for back pain and myalgias.  Neurological: Negative.   Psychiatric/Behavioral: Negative.     Social History   Tobacco Use  . Smoking status: Current Some Day Smoker    Types: Cigars  . Smokeless tobacco: Never Used  . Tobacco comment: smokes cigars  Substance Use Topics   . Alcohol use: Yes    Alcohol/week: 0.0 standard drinks    Comment: ocassional alcohol use; Mixed drinks once every 2-3 months      Objective:   BP 122/80 (BP Location: Left Arm, Patient Position: Sitting, Cuff Size: Large)   Pulse (!) 109   Temp 98.3 F (36.8 C) (Oral)   Resp 16   Ht 5\' 1"  (1.549 m)   Wt 297 lb 6.4 oz (134.9 kg)   BMI 56.19 kg/m  Vitals:   08/08/18 0843  BP: 122/80  Pulse: (!) 109  Resp: 16  Temp: 98.3 F (36.8 C)  TempSrc: Oral  Weight: 297 lb 6.4 oz (134.9 kg)  Height: 5\' 1"  (1.549 m)     Physical Exam Vitals signs reviewed.  Constitutional:      General: She is not in acute distress.    Appearance: Normal appearance. She is well-developed. She is obese. She is not diaphoretic.  HENT:     Head: Normocephalic and atraumatic.  Neck:     Musculoskeletal: Normal range of motion and neck supple.  Cardiovascular:     Rate and Rhythm: Normal rate and regular rhythm.     Heart sounds: Normal heart sounds. No murmur. No friction rub. No gallop.   Pulmonary:     Effort: Pulmonary effort is normal. No respiratory distress.     Breath sounds: Normal breath sounds. No wheezing or rales.  Musculoskeletal:     Lumbar back: She exhibits decreased range of motion and tenderness. She exhibits no bony tenderness, no swelling and no spasm.       Back:  Neurological:     Mental Status: She is alert.         Assessment & Plan    1. Encounter for surveillance of injectable contraceptive Depoprovera given today without issue. Return in 3 months (between June 3-17) for next njection.  - medroxyPROGESTERone (DEPO-PROVERA) injection 150 mg  2. Acute bilateral low back pain without sciatica Toradol IM given today. Advised to use moist heat on the area and stretch lightly. Call if worsening.  - ketorolac (TORADOL) injection 60 mg  3. Chronic diastolic CHF (congestive heart failure) (HCC) Stable. Continue medications as prescribed.   4. Major depressive  disorder with single episode, in full remission (HCC) Stable. Continue fluoxetine 20mg .   5. Screen for STD (sexually transmitted disease) Patient requesting. H/O trich.  - STD Screen (8)  6. Encounter for weight loss counseling Continue for 3 months. Serious discussion had today about considering bariatric surgery. Discussed that with her heart history she does not benefit from the up and down weight and using stimulants for weight loss. Wants to try one more time then may consider referral to bariatric surgery.  - phentermine (ADIPEX-P) 37.5 MG tablet; Take 1 tablet (37.5 mg total) by mouth daily before breakfast.  Dispense: 30 tablet; Refill: 2  7. Morbid obesity with body mass index (BMI) of 50.0 to 59.9 in adult [facility]) See above medical treatment plan. - phentermine (ADIPEX-P) 37.5 MG tablet; Take 1  tablet (37.5 mg total) by mouth daily before breakfast.  Dispense: 30 tablet; Refill: 2     Margaretann Loveless, [name]  [facility]

## 2018-08-08 NOTE — Patient Instructions (Signed)

## 2018-08-09 LAB — STD SCREEN (8)
HEP C VIRUS AB: 0.1 {s_co_ratio} (ref 0.0–0.9)
HIV SCREEN 4TH GENERATION: NONREACTIVE
HSV 1 Glycoprotein G Ab, IgG: <0.91 index (ref 0.00–0.90)
HSV 2 IgG, Type Spec: 1.45 index — ABNORMAL HIGH (ref 0.00–0.90)
Hep A IgM: NEGATIVE
Hep B C IgM: NEGATIVE
Hepatitis B Surface Ag: NEGATIVE
RPR Ser Ql: NONREACTIVE

## 2018-08-09 LAB — HSV-2 IGG SUPPLEMENTAL TEST: HSV-2 IgG Supplemental Test: POSITIVE — AB

## 2018-08-10 ENCOUNTER — Encounter: Payer: Self-pay | Admitting: Physician Assistant

## 2018-08-10 ENCOUNTER — Telehealth: Payer: Self-pay | Admitting: Physician Assistant

## 2018-08-10 DIAGNOSIS — M545 Low back pain, unspecified: Secondary | ICD-10-CM

## 2018-08-10 MED ORDER — METHYLPREDNISOLONE 4 MG PO TBPK: ORAL_TABLET | ORAL | 0 refills | Status: DC

## 2018-08-10 NOTE — Telephone Encounter (Signed)
Pt called regarding her lab results.  She has not gotten a report in my-chart or a call from the office  [phone]  Thanks teri

## 2018-09-17 ENCOUNTER — Telehealth: Payer: Managed Care, Other (non HMO) | Admitting: Family

## 2018-09-17 ENCOUNTER — Encounter: Payer: Self-pay | Admitting: Physician Assistant

## 2018-09-17 DIAGNOSIS — M5440 Lumbago with sciatica, unspecified side: Secondary | ICD-10-CM

## 2018-09-17 MED ORDER — NAPROXEN 500 MG PO TABS: 500.0000 mg | ORAL_TABLET | Freq: Two times a day (BID) | ORAL | 0 refills | Status: DC

## 2018-09-17 MED ORDER — BACLOFEN 10 MG PO TABS: 10.0000 mg | ORAL_TABLET | Freq: Three times a day (TID) | ORAL | 0 refills | Status: DC

## 2018-09-17 NOTE — Progress Notes (Signed)
We are sorry that you are not feeling well.  Here is how we plan to help!  Based on what you have shared with me it looks like you mostly have acute back pain.  Acute back pain is defined as musculoskeletal pain that can resolve in 1-3 weeks with conservative treatment.  I have prescribed Naprosyn 500 mg twice a day non-steroid anti-inflammatory (NSAID) as well as Baclofen 10 mg every eight hours as needed which is a muscle relaxer  Some patients experience stomach irritation or in increased heartburn with anti-inflammatory drugs.  Please keep in mind that muscle relaxer's can cause fatigue and should not be taken while at work or driving.  Back pain is very common.  The pain often gets better over time.  The cause of back pain is usually not dangerous.  Most people can learn to manage their back pain on their own.   *If your migraine or neck worsens you will need to be seen face to face.   Approximately 5 minutes was spent documenting and reviewing patient's chart.    Home Care  Stay active.  Start with short walks on flat ground if you can.  Try to walk farther each day.  Do not sit, drive or stand in one place for more than 30 minutes.  Do not stay in bed.  Do not avoid exercise or work.  Activity can help your back heal faster.  Be careful when you bend or lift an object.  Bend at your knees, keep the object close to you, and do not twist.  Sleep on a firm mattress.  Lie on your side, and bend your knees.  If you lie on your back, put a pillow under your knees.  Only take medicines as told by your doctor.  Put ice on the injured area.  Put ice in a plastic bag  Place a towel between your skin and the bag  Leave the ice on for 15-20 minutes, 3-4 times a day for the first 2-3 days. 210 After that, you can switch between ice and heat packs.  Ask your doctor about back exercises or massage.  Avoid feeling anxious or stressed.  Find good ways to deal with stress, such as  exercise.  Get Help Right Way If:  Your pain does not go away with rest or medicine.  Your pain does not go away in 1 week.  You have new problems.  You do not feel well.  The pain spreads into your legs.  You cannot control when you poop (bowel movement) or pee (urinate)  You feel sick to your stomach (nauseous) or throw up (vomit)  You have belly (abdominal) pain.  You feel like you may pass out (faint).  If you develop a fever.  Make Sure you:  Understand these instructions.  Will watch your condition  Will get help right away if you are not doing well or get worse.  Your e-visit answers were reviewed by a board certified advanced clinical practitioner to complete your personal care plan.  Depending on the condition, your plan could have included both over the counter or prescription medications.  If there is a problem please reply  once you have received a response from your provider.  Your safety is important to us.  If you have drug allergies check your prescription carefully.    You can use MyChart to ask questions about today's visit, request a non-urgent call back, or ask for a work or school excuse  for 24 hours related to this e-Visit. If it has been greater than 24 hours you will need to follow up with your provider, or enter a new e-Visit to address those concerns.  You will get an e-mail in the next two days asking about your experience.  I hope that your e-visit has been valuable and will speed your recovery. Thank you for using e-visits.

## 2018-10-24 ENCOUNTER — Telehealth: Payer: Self-pay | Admitting: Physician Assistant

## 2018-10-24 NOTE — Telephone Encounter (Signed)
Pt dropped off [employer] forms and is requesting Antony Contras complete the forms and fax them to [employer] Fax#[phone]. Pt would also like to be called once forms are completed so she can pick up a copy. Forms placed in Jenni's box. Please advise. Thanks TNP

## 2018-10-25 NOTE — Telephone Encounter (Signed)
Form received and placed on Jenni's desk.

## 2018-11-02 NOTE — Telephone Encounter (Signed)
LM letting patient know that forms to [employer] were faxed today.

## 2018-11-02 NOTE — Telephone Encounter (Signed)
Form completed and will be given to [name] to fax.

## 2018-11-06 NOTE — Progress Notes (Signed)
Patient: [name] Female    DOB: Mar 24, 1986   33 y.o.   MRN: [id number] Visit Date: 11/07/2018  Today's Provider: Mar Daring, [name]   Chief Complaint  Patient presents with  . Follow-up    Wt, Depo-Provera injection   Subjective:     HPI  Major depressive disorder with single episode, in full remission ([place]) From 08/08/2018-Stable. Continue fluoxetine 20mg .   Encounter for weight loss counseling From 08/08/2018-Continue for 3 months. Serious discussion had today about considering bariatric surgery. Discussed that with her heart history [name] does not benefit from the up and down weight and using stimulants for weight loss. Wants to try one more time then may consider referral to bariatric surgery.   Wt Readings from Last 3 Encounters:  11/07/18 297 lb 3.2 oz (134.8 kg)  08/08/18 297 lb 6.4 oz (134.9 kg)  06/13/18 297 lb (134.7 kg)    Depo-Provera: patient here for her depo provera contraception management. Last injection given on left ventrogluteal. [name] is to return for Depo Sept 6- Sept 16  Depression screen [facility] 2/9 11/07/2018 07/24/2017 12/02/2016  Decreased Interest 1 1 0  Down, Depressed, Hopeless 2 1 0  PHQ - 2 Score 3 2 0  Altered sleeping 2 1 0  Tired, decreased energy 2 1 0  Change in appetite 1 2 0  Feeling bad or failure about yourself  2 0 1  Trouble concentrating 2 0 0  Moving slowly or fidgety/restless 1 0 0  Suicidal thoughts 0 0 0  PHQ-9 Score 13 6 1   Difficult doing work/chores Somewhat difficult Not difficult at all Not difficult at all     Allergies  Allergen Reactions  . Lactose Intolerance (Gi) Diarrhea  . Penicillins Hives    Has patient had a PCN reaction causing immediate rash, facial/tongue/throat swelling, SOB or lightheadedness with hypotension: Yes Has patient had a PCN reaction causing severe rash involving mucus membranes or skin necrosis: No Has patient had a PCN reaction that required hospitalization No Has patient  had a PCN reaction occurring within the last 10 years: Yes If all of the above answers are "NO", then may proceed with Cephalosporin use.  Marland Kitchen Pineapple Other (See Comments)    Patient states this cuts my mouth and makes it bleed.     Current Outpatient Medications:  .  albuterol (PROAIR HFA) 108 (90 Base) MCG/ACT inhaler, Inhale 1-2 puffs into the lungs every 6 (six) hours as needed for wheezing or shortness of breath., Disp: 18 g, Rfl: 11 .  baclofen (LIORESAL) 10 MG tablet, Take 1 tablet (10 mg total) by mouth 3 (three) times daily., Disp: 30 each, Rfl: 0 .  FLUoxetine (PROZAC) 20 MG tablet, Take 1 tablet (20 mg total) by mouth daily., Disp: 30 tablet, Rfl: 1 .  fluticasone furoate-vilanterol (BREO ELLIPTA) 100-25 MCG/INH AEPB, Inhale 1 puff into the lungs daily., Disp: 60 each, Rfl: 11 .  metroNIDAZOLE (METROGEL) 0.75 % vaginal gel, Place 1 Applicatorful vaginally 2 (two) times daily., Disp: 70 g, Rfl: 0 .  mometasone (ELOCON) 0.1 % cream, Apply 1 application topically daily., Disp: 45 g, Rfl: 0 .  montelukast (SINGULAIR) 10 MG tablet, TAKE 1 TABLET BY MOUTH AT  BEDTIME, Disp: 90 tablet, Rfl: 1 .  naproxen (NAPROSYN) 500 MG tablet, Take 1 tablet (500 mg total) by mouth 2 (two) times daily with a meal., Disp: 30 tablet, Rfl: 0 .  nystatin cream (MYCOSTATIN), Apply 1 application topically 2 (  two) times daily., Disp: 30 g, Rfl: 0 .  omeprazole (PRILOSEC) 40 MG capsule, Take 1 capsule (40 mg total) daily by mouth., Disp: 30 capsule, Rfl: 3 .  potassium chloride (K-DUR) 10 MEQ tablet, Take 1 tablet (10 mEq total) by mouth daily., Disp: 90 tablet, Rfl: 1 .  torsemide (DEMADEX) 20 MG tablet, Take 2 tablets (40 mg total) by mouth daily., Disp: 60 tablet, Rfl: 6 .  phentermine (ADIPEX-P) 37.5 MG tablet, Take 1 tablet (37.5 mg total) by mouth daily before breakfast. (Patient not taking: Reported on 11/07/2018), Disp: 30 tablet, Rfl: 2 .  Vitamin D, Ergocalciferol, (DRISDOL) 50000 units CAPS capsule, Take  1 capsule (50,000 Units total) by mouth every 7 (seven) days. (Patient not taking: Reported on 11/07/2018), Disp: 12 capsule, Rfl: 1  Current Facility-Administered Medications:  .  medroxyPROGESTERone (DEPO-PROVERA) injection 150 mg, 150 mg, Intramuscular, Once, Burnette, Jennifer M, [name]  Review of Systems  Constitutional: Negative for appetite change, chills, fatigue and fever.  Respiratory: Negative for chest tightness and shortness of breath.   Cardiovascular: Negative for chest pain and palpitations.  Gastrointestinal: Negative for abdominal pain, nausea and vomiting.  Musculoskeletal: Positive for back pain.  Neurological: Negative for dizziness and weakness.    Social History   Tobacco Use  . Smoking status: Current Some Day Smoker    Types: Cigars  . Smokeless tobacco: Never Used  . Tobacco comment: smokes cigars  Substance Use Topics  . Alcohol use: Yes    Alcohol/week: 0.0 standard drinks    Comment: ocassional alcohol use; Mixed drinks once every 2-3 months      Objective:   BP 126/84 (BP Location: Left Wrist, Patient Position: Sitting, Cuff Size: Large)   Pulse (!) 106   Temp (!) 97.5 F (36.4 C) (Oral)   Resp 16   Wt 297 lb 3.2 oz (134.8 kg)   BMI 56.16 kg/m  Vitals:   11/07/18 0834  BP: 126/84  Pulse: (!) 106  Resp: 16  Temp: (!) 97.5 F (36.4 C)  TempSrc: Oral  Weight: 297 lb 3.2 oz (134.8 kg)     Physical Exam Vitals signs reviewed.  Constitutional:      General: [name] is not in acute distress.    Appearance: Normal appearance. [name] is well-developed. [name] is obese. [name] is not ill-appearing or diaphoretic.  Neck:     Musculoskeletal: Normal range of motion and neck supple.     Thyroid: No thyromegaly.     Vascular: No JVD.     Trachea: No tracheal deviation.  Cardiovascular:     Rate and Rhythm: Normal rate and regular rhythm.     Pulses: Normal pulses.     Heart sounds: Normal heart sounds. No murmur. No friction rub. No gallop.   Pulmonary:      Effort: Pulmonary effort is normal. No respiratory distress.     Breath sounds: Normal breath sounds. No wheezing or rales.  Lymphadenopathy:     Cervical: No cervical adenopathy.  Neurological:     Mental Status: [name] is alert.  Psychiatric:        Mood and Affect: Mood normal.        Behavior: Behavior normal.        Thought Content: Thought content normal.        Judgment: Judgment normal.         Assessment & Plan    1. Encounter for surveillance of injectable contraceptive Depo provera injection given today without issue. Return in 3  months. - medroxyPROGESTERone (DEPO-PROVERA) injection 150 mg  2. Encounter for weight loss counseling Will restart phentermine as below. Was able to maintain weight loss without phentermine x 3 months. Continue intermittent fasting and walking.  - phentermine (ADIPEX-P) 37.5 MG tablet; Take 1 tablet (37.5 mg total) by mouth daily before breakfast.  Dispense: 30 tablet; Refill: 2  3. Morbid obesity with body mass index (BMI) of 50.0 to 59.9 in adult [facility](HCC) See above medical treatment plan. - phentermine (ADIPEX-P) 37.5 MG tablet; Take 1 tablet (37.5 mg total) by mouth daily before breakfast.  Dispense: 30 tablet; Refill: 2     Margaretann LovelessJennifer M Burnette, [name]  [facility]

## 2018-11-07 ENCOUNTER — Telehealth: Payer: Self-pay | Admitting: Physician Assistant

## 2018-11-07 ENCOUNTER — Other Ambulatory Visit: Payer: Self-pay

## 2018-11-07 ENCOUNTER — Ambulatory Visit: Payer: Managed Care, Other (non HMO) | Admitting: Physician Assistant

## 2018-11-07 ENCOUNTER — Encounter: Payer: Self-pay | Admitting: Physician Assistant

## 2018-11-07 VITALS — BP 126/84 | HR 106 | Temp 97.5°F | Resp 16 | Wt 297.2 lb

## 2018-11-07 DIAGNOSIS — Z713 Dietary counseling and surveillance: Secondary | ICD-10-CM

## 2018-11-07 DIAGNOSIS — Z6841 Body Mass Index (BMI) 40.0 and over, adult: Secondary | ICD-10-CM

## 2018-11-07 DIAGNOSIS — Z3042 Encounter for surveillance of injectable contraceptive: Secondary | ICD-10-CM

## 2018-11-07 MED ORDER — PHENTERMINE HCL 37.5 MG PO TABS: 37.5000 mg | ORAL_TABLET | Freq: Every day | ORAL | 2 refills | Status: DC

## 2018-11-07 MED ORDER — MEDROXYPROGESTERONE ACETATE 150 MG/ML IM SUSP
150.0000 mg | Freq: Once | INTRAMUSCULAR | Status: AC
  Administered 2018-11-07: 150 mg via INTRAMUSCULAR

## 2018-11-07 NOTE — Patient Instructions (Signed)
Calorie Counting for Weight Loss °Calories are units of energy. Your body needs a certain amount of calories from food to keep you going throughout the day. When you eat more calories than your body needs, your body stores the extra calories as fat. When you eat fewer calories than your body needs, your body burns fat to get the energy it needs. °Calorie counting means keeping track of how many calories you eat and drink each day. Calorie counting can be helpful if you need to lose weight. If you make sure to eat fewer calories than your body needs, you should lose weight. Ask your health care provider what a healthy weight is for you. °For calorie counting to work, you will need to eat the right number of calories in a day in order to lose a healthy amount of weight per week. A dietitian can help you determine how many calories you need in a day and will give you suggestions on how to reach your calorie goal. °· A healthy amount of weight to lose per week is usually 1-2 lb (0.5-0.9 kg). This usually means that your daily calorie intake should be reduced by 500-750 calories. °· Eating 1,200 - 1,500 calories per day can help most women lose weight. °· Eating 1,500 - 1,800 calories per day can help most men lose weight. °What is my plan? °My goal is to have 1200 calories per day. °If I have this many calories per day, I should lose around 1-2 pounds per week. °What do I need to know about calorie counting? °In order to meet your daily calorie goal, you will need to: °· Find out how many calories are in each food you would like to eat. Try to do this before you eat. °· Decide how much of the food you plan to eat. °· Write down what you ate and how many calories it had. Doing this is called keeping a food log. °To successfully lose weight, it is important to balance calorie counting with a healthy lifestyle that includes regular activity. Aim for 150 minutes of moderate exercise (such as walking) or 75 minutes of  vigorous exercise (such as running) each week. °Where do I find calorie information? ° °The number of calories in a food can be found on a Nutrition Facts label. If a food does not have a Nutrition Facts label, try to look up the calories online or ask your dietitian for help. °Remember that calories are listed per serving. If you choose to have more than one serving of a food, you will have to multiply the calories per serving by the amount of servings you plan to eat. For example, the label on a package of bread might say that a serving size is 1 slice and that there are 90 calories in a serving. If you eat 1 slice, you will have eaten 90 calories. If you eat 2 slices, you will have eaten 180 calories. °How do I keep a food log? °Immediately after each meal, record the following information in your food log: °· What you ate. Don't forget to include toppings, sauces, and other extras on the food. °· How much you ate. This can be measured in cups, ounces, or number of items. °· How many calories each food and drink had. °· The total number of calories in the meal. °Keep your food log near you, such as in a small notebook in your pocket, or use a mobile app or website. Some programs will calculate   calories for you and show you how many calories you have left for the day to meet your goal. °What are some calorie counting tips? ° °· Use your calories on foods and drinks that will fill you up and not leave you hungry: °? Some examples of foods that fill you up are nuts and nut butters, vegetables, lean proteins, and high-fiber foods like whole grains. High-fiber foods are foods with more than 5 g fiber per serving. °? Drinks such as sodas, specialty coffee drinks, alcohol, and juices have a lot of calories, yet do not fill you up. °· Eat nutritious foods and avoid empty calories. Empty calories are calories you get from foods or beverages that do not have many vitamins or protein, such as candy, sweets, and soda. It is  better to have a nutritious high-calorie food (such as an avocado) than a food with few nutrients (such as a bag of chips). °· Know how many calories are in the foods you eat most often. This will help you calculate calorie counts faster. °· Pay attention to calories in drinks. Low-calorie drinks include water and unsweetened drinks. °· Pay attention to nutrition labels for "low fat" or "fat free" foods. These foods sometimes have the same amount of calories or more calories than the full fat versions. They also often have added sugar, starch, or salt, to make up for flavor that was removed with the fat. °· Find a way of tracking calories that works for you. Get creative. Try different apps or programs if writing down calories does not work for you. °What are some portion control tips? °· Know how many calories are in a serving. This will help you know how many servings of a certain food you can have. °· Use a measuring cup to measure serving sizes. You could also try weighing out portions on a kitchen scale. With time, you will be able to estimate serving sizes for some foods. °· Take some time to put servings of different foods on your favorite plates, bowls, and cups so you know what a serving looks like. °· Try not to eat straight from a bag or box. Doing this can lead to overeating. Put the amount you would like to eat in a cup or on a plate to make sure you are eating the right portion. °· Use smaller plates, glasses, and bowls to prevent overeating. °· Try not to multitask (for example, watch TV or use your computer) while eating. If it is time to eat, sit down at a table and enjoy your food. This will help you to know when you are full. It will also help you to be aware of what you are eating and how much you are eating. °What are tips for following this plan? °Reading food labels °· Check the calorie count compared to the serving size. The serving size may be smaller than what you are used to  eating. °· Check the source of the calories. Make sure the food you are eating is high in vitamins and protein and low in saturated and trans fats. °Shopping °· Read nutrition labels while you shop. This will help you make healthy decisions before you decide to purchase your food. °· Make a grocery list and stick to it. °Cooking °· Try to cook your favorite foods in a healthier way. For example, try baking instead of frying. °· Use low-fat dairy products. °Meal planning °· Use more fruits and vegetables. Half of your plate should be fruits   and vegetables. °· Include lean proteins like poultry and fish. °How do I count calories when eating out? °· Ask for smaller portion sizes. °· Consider sharing an entree and sides instead of getting your own entree. °· If you get your own entree, eat only half. Ask for a box at the beginning of your meal and put the rest of your entree in it so you are not tempted to eat it. °· If calories are listed on the menu, choose the lower calorie options. °· Choose dishes that include vegetables, fruits, whole grains, low-fat dairy products, and lean protein. °· Choose items that are boiled, broiled, grilled, or steamed. Stay away from items that are buttered, battered, fried, or served with cream sauce. Items labeled "crispy" are usually fried, unless stated otherwise. °· Choose water, low-fat milk, unsweetened iced tea, or other drinks without added sugar. If you want an alcoholic beverage, choose a lower calorie option such as a glass of wine or light beer. °· Ask for dressings, sauces, and syrups on the side. These are usually high in calories, so you should limit the amount you eat. °· If you want a salad, choose a garden salad and ask for grilled meats. Avoid extra toppings like bacon, cheese, or fried items. Ask for the dressing on the side, or ask for olive oil and vinegar or lemon to use as dressing. °· Estimate how many servings of a food you are given. For example, a serving of  cooked rice is ½ cup or about the size of half a baseball. Knowing serving sizes will help you be aware of how much food you are eating at restaurants. The list below tells you how big or small some common portion sizes are based on everyday objects: °? 1 oz--4 stacked dice. °? 3 oz--1 deck of cards. °? 1 tsp--1 die. °? 1 Tbsp--½ a ping-pong ball. °? 2 Tbsp--1 ping-pong ball. °? ½ cup--½ baseball. °? 1 cup--1 baseball. °Summary °· Calorie counting means keeping track of how many calories you eat and drink each day. If you eat fewer calories than your body needs, you should lose weight. °· A healthy amount of weight to lose per week is usually 1-2 lb (0.5-0.9 kg). This usually means reducing your daily calorie intake by 500-750 calories. °· The number of calories in a food can be found on a Nutrition Facts label. If a food does not have a Nutrition Facts label, try to look up the calories online or ask your dietitian for help. °· Use your calories on foods and drinks that will fill you up, and not on foods and drinks that will leave you hungry. °· Use smaller plates, glasses, and bowls to prevent overeating. °This information is not intended to replace advice given to you by your health care provider. Make sure you discuss any questions you have with your health care provider. °Document Released: 05/09/2005 Document Revised: 01/26/2018 Document Reviewed: 04/08/2016 °Elsevier Interactive Patient Education © 2019 Elsevier Inc. ° °

## 2018-11-07 NOTE — Telephone Encounter (Signed)
[employer] faxed workplace accommodation forms noting that they need more information. Forms were placed in [name]'s box. Thanks TNP

## 2018-11-07 NOTE — Telephone Encounter (Signed)
Faxed to [employer]

## 2018-12-25 ENCOUNTER — Telehealth: Payer: Self-pay | Admitting: Physician Assistant

## 2018-12-25 NOTE — Telephone Encounter (Signed)
Pt stated that [employer] was supposed to have faxed FMLA forms and pt is asking for a status update. Please advise. Thanks TNP

## 2018-12-26 ENCOUNTER — Encounter: Payer: Self-pay | Admitting: Physician Assistant

## 2018-12-26 NOTE — Telephone Encounter (Signed)
LMTCB justto inform that the FMLA forms are completed and faxed today to [employer].

## 2019-02-01 NOTE — Progress Notes (Signed)
Patient: [name] Female    DOB: 06/26/1985   33 y.o.   MRN: [id number] Visit Date: 02/04/2019  Today's Provider: Margaretann LovelessJennifer M Burnette, [name]   Chief Complaint  Patient presents with  . Follow-up   Subjective:    I,Joseline E. Rosas,RMA am acting as a Neurosurgeonscribe for PPG IndustriesJennifer M. Burnette, [name].  HPI  Encounter for weight loss counseling From 11/07/2018-restarted phentermine 37.5 mg qd. Was able to maintain weight loss without phentermine x 3 months. Continue intermittent fasting and walking.   Wt Readings from Last 3 Encounters:  02/04/19 283 lb 6.4 oz (128.5 kg)  11/07/18 297 lb 3.2 oz (134.8 kg)  08/08/18 297 lb 6.4 oz (134.9 kg)   She is also requesting a steroid injection today for her low back pain. This is chronic. No changes.   Allergies  Allergen Reactions  . Lactose Intolerance (Gi) Diarrhea  . Penicillins Hives    Has patient had a PCN reaction causing immediate rash, facial/tongue/throat swelling, SOB or lightheadedness with hypotension: Yes Has patient had a PCN reaction causing severe rash involving mucus membranes or skin necrosis: No Has patient had a PCN reaction that required hospitalization No Has patient had a PCN reaction occurring within the last 10 years: Yes If all of the above answers are "NO", then may proceed with Cephalosporin use.  Marland Kitchen. Pineapple Other (See Comments)    Patient states this cuts my mouth and makes it bleed.     Current Outpatient Medications:  .  albuterol (PROAIR HFA) 108 (90 Base) MCG/ACT inhaler, Inhale 1-2 puffs into the lungs every 6 (six) hours as needed for wheezing or shortness of breath., Disp: 18 g, Rfl: 11 .  baclofen (LIORESAL) 10 MG tablet, Take 1 tablet (10 mg total) by mouth 3 (three) times daily., Disp: 30 each, Rfl: 0 .  FLUoxetine (PROZAC) 20 MG tablet, Take 1 tablet (20 mg total) by mouth daily., Disp: 30 tablet, Rfl: 1 .  fluticasone furoate-vilanterol (BREO ELLIPTA) 100-25 MCG/INH AEPB, Inhale 1 puff  into the lungs daily., Disp: 60 each, Rfl: 11 .  metroNIDAZOLE (METROGEL) 0.75 % vaginal gel, Place 1 Applicatorful vaginally 2 (two) times daily., Disp: 70 g, Rfl: 0 .  mometasone (ELOCON) 0.1 % cream, Apply 1 application topically daily., Disp: 45 g, Rfl: 0 .  montelukast (SINGULAIR) 10 MG tablet, TAKE 1 TABLET BY MOUTH AT  BEDTIME, Disp: 90 tablet, Rfl: 1 .  naproxen (NAPROSYN) 500 MG tablet, Take 1 tablet (500 mg total) by mouth 2 (two) times daily with a meal., Disp: 30 tablet, Rfl: 0 .  nystatin cream (MYCOSTATIN), Apply 1 application topically 2 (two) times daily., Disp: 30 g, Rfl: 0 .  omeprazole (PRILOSEC) 40 MG capsule, Take 1 capsule (40 mg total) daily by mouth., Disp: 30 capsule, Rfl: 3 .  phentermine (ADIPEX-P) 37.5 MG tablet, Take 1 tablet (37.5 mg total) by mouth daily before breakfast., Disp: 30 tablet, Rfl: 2 .  potassium chloride (K-DUR) 10 MEQ tablet, Take 1 tablet (10 mEq total) by mouth daily., Disp: 90 tablet, Rfl: 1 .  torsemide (DEMADEX) 20 MG tablet, Take 2 tablets (40 mg total) by mouth daily., Disp: 60 tablet, Rfl: 6 .  Vitamin D, Ergocalciferol, (DRISDOL) 50000 units CAPS capsule, Take 1 capsule (50,000 Units total) by mouth every 7 (seven) days., Disp: 12 capsule, Rfl: 1  Review of Systems  Constitutional: Negative for appetite change, chills, fatigue and fever.  Respiratory: Negative for chest tightness and shortness  of breath.   Cardiovascular: Negative for chest pain and palpitations.  Gastrointestinal: Negative for abdominal pain, nausea and vomiting.  Neurological: Negative for dizziness and weakness.  Psychiatric/Behavioral: Negative for sleep disturbance.    Social History   Tobacco Use  . Smoking status: Current Some Day Smoker    Types: Cigars  . Smokeless tobacco: Never Used  . Tobacco comment: smokes cigars  Substance Use Topics  . Alcohol use: Yes    Alcohol/week: 0.0 standard drinks    Comment: ocassional alcohol use; Mixed drinks once every  2-3 months      Objective:   BP 116/81 (BP Location: Left Wrist, Patient Position: Sitting, Cuff Size: Large)   Pulse (!) 109   Temp (!) 97.5 F (36.4 C) (Other (Comment)) Comment (Src): forehead  Resp 16   Wt 283 lb 6.4 oz (128.5 kg)   BMI 53.55 kg/m  Vitals:   02/04/19 0810  BP: 116/81  Pulse: (!) 109  Resp: 16  Temp: (!) 97.5 F (36.4 C)  TempSrc: Other (Comment)  Weight: 283 lb 6.4 oz (128.5 kg)  Body mass index is 53.55 kg/m.   Physical Exam Vitals signs reviewed.  Constitutional:      General: She is not in acute distress.    Appearance: Normal appearance. She is well-developed. She is obese. She is not ill-appearing or diaphoretic.  Neck:     Musculoskeletal: Normal range of motion and neck supple.  Cardiovascular:     Rate and Rhythm: Normal rate and regular rhythm.     Heart sounds: Normal heart sounds. No murmur. No friction rub. No gallop.   Pulmonary:     Effort: Pulmonary effort is normal. No respiratory distress.     Breath sounds: Normal breath sounds. No wheezing or rales.  Neurological:     Mental Status: She is alert.      No results found for any visits on 02/04/19.     Assessment & Plan    1. Encounter for surveillance of injectable contraceptive Depo provera given without issue. Return in 3 months.  - medroxyPROGESTERone (DEPO-PROVERA) injection 150 mg  2. Chronic bilateral low back pain without sciatica Previously was given Toradol 60mg  IM in March without relief. Wants to try medrol today. Given as below.  - methylPREDNISolone acetate (DEPO-MEDROL) injection 80 mg  3. Encounter for weight loss counseling Has lost 14 pounds. She is doing a food diary and exercising 3 times per week. Continue phentermine as below and f/u in 3 months.  - phentermine (ADIPEX-P) 37.5 MG tablet; Take 1 tablet (37.5 mg total) by mouth daily before breakfast.  Dispense: 30 tablet; Refill: 2  4. Morbid obesity with body mass index (BMI) of 50.0 to 59.9 in  adult [facility]) See above medical treatment plan. - phentermine (ADIPEX-P) 37.5 MG tablet; Take 1 tablet (37.5 mg total) by mouth daily before breakfast.  Dispense: 30 tablet; Refill: [place], [name]  [facility]

## 2019-02-04 ENCOUNTER — Encounter: Payer: Self-pay | Admitting: Physician Assistant

## 2019-02-04 ENCOUNTER — Ambulatory Visit: Payer: Managed Care, Other (non HMO) | Admitting: Physician Assistant

## 2019-02-04 ENCOUNTER — Other Ambulatory Visit: Payer: Self-pay

## 2019-02-04 VITALS — BP 116/81 | HR 109 | Temp 97.5°F | Resp 16 | Wt 283.4 lb

## 2019-02-04 DIAGNOSIS — Z3042 Encounter for surveillance of injectable contraceptive: Secondary | ICD-10-CM | POA: Diagnosis not present

## 2019-02-04 DIAGNOSIS — Z6841 Body Mass Index (BMI) 40.0 and over, adult: Secondary | ICD-10-CM

## 2019-02-04 DIAGNOSIS — G8929 Other chronic pain: Secondary | ICD-10-CM | POA: Diagnosis not present

## 2019-02-04 DIAGNOSIS — M545 Low back pain, unspecified: Secondary | ICD-10-CM

## 2019-02-04 DIAGNOSIS — Z713 Dietary counseling and surveillance: Secondary | ICD-10-CM | POA: Diagnosis not present

## 2019-02-04 MED ORDER — METHYLPREDNISOLONE ACETATE 40 MG/ML IJ SUSP
80.0000 mg | Freq: Once | INTRAMUSCULAR | Status: AC
  Administered 2019-02-04: 80 mg via INTRAMUSCULAR

## 2019-02-04 MED ORDER — MEDROXYPROGESTERONE ACETATE 150 MG/ML IM SUSP
150.0000 mg | Freq: Once | INTRAMUSCULAR | Status: AC
  Administered 2019-02-04: 150 mg via INTRAMUSCULAR

## 2019-02-04 MED ORDER — PHENTERMINE HCL 37.5 MG PO TABS: 37.5000 mg | ORAL_TABLET | Freq: Every day | ORAL | 2 refills | Status: DC

## 2019-02-04 NOTE — Patient Instructions (Addendum)
Patient to return between [place] 30 to [place] 14 for Depo Provera injection. Patient scheduled Dec 10,2020 At 8 am

## 2019-02-13 ENCOUNTER — Telehealth: Payer: Managed Care, Other (non HMO) | Admitting: Family

## 2019-02-13 ENCOUNTER — Encounter: Payer: Self-pay | Admitting: Physician Assistant

## 2019-02-13 ENCOUNTER — Encounter (INDEPENDENT_AMBULATORY_CARE_PROVIDER_SITE_OTHER): Payer: Self-pay

## 2019-02-13 DIAGNOSIS — Z20822 Contact with and (suspected) exposure to covid-19: Secondary | ICD-10-CM

## 2019-02-13 DIAGNOSIS — J0140 Acute pansinusitis, unspecified: Secondary | ICD-10-CM

## 2019-02-13 MED ORDER — ALBUTEROL SULFATE HFA 108 (90 BASE) MCG/ACT IN AERS: 2.0000 | INHALATION_SPRAY | Freq: Four times a day (QID) | RESPIRATORY_TRACT | 0 refills | Status: DC | PRN

## 2019-02-13 MED ORDER — BENZONATATE 100 MG PO CAPS: 100.0000 mg | ORAL_CAPSULE | Freq: Three times a day (TID) | ORAL | 0 refills | Status: DC | PRN

## 2019-02-13 NOTE — Progress Notes (Signed)
E-Visit for Corona Virus Screening   Your current symptoms could be consistent with the coronavirus.  Many health care providers can now test patients at their office but not all are.  [place] has multiple testing sites. For information on our COVID testing locations and hours go to [email or web address]  Please quarantine yourself while awaiting your test results.  We are enrolling you in our MyChart Home [place] for COVID19 . Daily you will receive a questionnaire within the MyChart website. Our COVID 19 response team willl be monitoriing your responses daily.  Approximately 5 minutes was spent documenting and reviewing patient's chart.   You can go to one of the  testing sites listed below, while they are opened (see hours). You do not need an order and will stay in your car during the test. You do need to self isolate until your results return and if positive 14 days from when your symptoms started and until you are 3 days symptom free.   Testing Locations (Monday - Friday, 8 a.m. - 3:30 p.m.) . [place]: [facility], [address], [place], [place]  . [place]: [address] [place], [place] (entrance [address])  . [facility]: (Closed each Monday): Testing site relocated to the short stay covered drive at [facility]. (Use the [facility] entrance to [facility] next to [facility].)   COVID-19 is a respiratory illness with symptoms that are similar to the flu. Symptoms are typically mild to moderate, but there have been cases of severe illness and death due to the virus. The following symptoms may appear 2-14 days after exposure: . Fever . Cough . Shortness of breath or difficulty breathing . Chills . Repeated shaking with chills . Muscle pain . Headache . Sore throat . New loss of taste or smell . Fatigue . Congestion or runny  nose . Nausea or vomiting . Diarrhea  It is vitally important that if you feel that you have an infection such as this virus or any other virus that you stay home and away from places where you may spread it to others.  You should self-quarantine for 14 days if you have symptoms that could potentially be coronavirus or have been in close contact a with a person diagnosed with COVID-19 within the last 2 weeks. You should avoid contact with people age 75 and older.   You should wear a mask or cloth face covering over your nose and mouth if you must be around other people or animals, including pets (even at home). Try to stay at least 6 feet away from other people. This will protect the people around you.  You can use medication such as A prescription cough medication called Tessalon Perles 100 mg. You may take 1-2 capsules every 8 hours as needed for cough and A prescription inhaler called Albuterol MDI 90 mcg /actuation 2 puffs every 4 hours as needed for shortness of breath, wheezing, cough  You may also take acetaminophen (Tylenol) as needed for fever.   Reduce your risk of any infection by using the same precautions used for avoiding the common cold or flu:  Marland Kitchen Wash your hands often with soap and warm water for at least 20 seconds.  If soap and water are not readily available, use an alcohol-based hand sanitizer with at least 60% alcohol.  . If coughing or sneezing, cover your mouth and nose by coughing or sneezing into  the elbow areas of your shirt or coat, into a tissue or into your sleeve (not your hands). . Avoid shaking hands with others and consider head nods or verbal greetings only. . Avoid touching your eyes, nose, or mouth with unwashed hands.  . Avoid close contact with people who are sick. . Avoid places or events with large numbers of people in one location, like concerts or sporting events. . Carefully consider travel plans you have or are making. . If you are planning any travel  outside or inside the [place], visit the CDC's Travelers' Health webpage for the latest health notices. . If you have some symptoms but not all symptoms, continue to monitor at home and seek medical attention if your symptoms worsen. . If you are having a medical emergency, call 911.  HOME CARE . Only take medications as instructed by your medical team. . Drink plenty of fluids and get plenty of rest. . A steam or ultrasonic humidifier can help if you have congestion.   GET HELP RIGHT AWAY IF YOU HAVE EMERGENCY WARNING SIGNS** FOR COVID-19. If you or someone is showing any of these signs seek emergency medical care immediately. Call 911 or proceed to your closest emergency facility if: . You develop worsening high fever. . Trouble breathing . Bluish lips or face . Persistent pain or pressure in the chest . New confusion . Inability to wake or stay awake . You cough up blood. . Your symptoms become more severe  **This list is not all possible symptoms. Contact your medical provider for any symptoms that are sever or concerning to you.   MAKE SURE YOU   Understand these instructions.  Will watch your condition.  Will get help right away if you are not doing well or get worse.  Your e-visit answers were reviewed by a board certified advanced clinical practitioner to complete your personal care plan.  Depending on the condition, your plan could have included both over the counter or prescription medications.  If there is a problem please reply once you have received a response from your provider.  Your safety is important to [place].  If you have drug allergies check your prescription carefully.    You can use MyChart to ask questions about today's visit, request a non-urgent call back, or ask for a work or school excuse for 24 hours related to this e-Visit. If it has been greater than 24 hours you will need to follow up with your provider, or enter a new e-Visit to address those concerns. You  will get an e-mail in the next two days asking about your experience.  I hope that your e-visit has been valuable and will speed your recovery. Thank you for using e-visits.

## 2019-02-14 ENCOUNTER — Other Ambulatory Visit: Payer: Self-pay | Admitting: *Deleted

## 2019-02-14 ENCOUNTER — Encounter (INDEPENDENT_AMBULATORY_CARE_PROVIDER_SITE_OTHER): Payer: Self-pay

## 2019-02-14 DIAGNOSIS — Z20822 Contact with and (suspected) exposure to covid-19: Secondary | ICD-10-CM

## 2019-02-15 ENCOUNTER — Encounter (INDEPENDENT_AMBULATORY_CARE_PROVIDER_SITE_OTHER): Payer: Self-pay

## 2019-02-15 LAB — NOVEL CORONAVIRUS, NAA: SARS-CoV-2, NAA: NOT DETECTED

## 2019-02-15 MED ORDER — DOXYCYCLINE HYCLATE 100 MG PO TABS: 100.0000 mg | ORAL_TABLET | Freq: Two times a day (BID) | ORAL | 0 refills | Status: DC

## 2019-02-15 NOTE — Addendum Note (Signed)
Addended by: Mar Daring on: 02/15/2019 08:54 AM   Modules accepted: Orders

## 2019-02-16 ENCOUNTER — Encounter (INDEPENDENT_AMBULATORY_CARE_PROVIDER_SITE_OTHER): Payer: Self-pay

## 2019-02-17 ENCOUNTER — Encounter (INDEPENDENT_AMBULATORY_CARE_PROVIDER_SITE_OTHER): Payer: Self-pay

## 2019-02-18 IMAGING — MG MM DIGITAL DIAGNOSTIC BILAT W/ TOMO W/ CAD
7 series · 8 of 23 positions shown · non-contrast
Comparison: Previous exam(s).

ACR Breast Density Category a: The breast tissue is almost entirely
fatty.

CLINICAL DATA: 32-year-old female with approximately 2 weeks of
right breast skin redness and pain. This area has not significantly
improved on a 10 day course of doxycycline.

EXAM:
DIGITAL DIAGNOSTIC BILATERAL MAMMOGRAM WITH CAD AND TOMO
ULTRASOUND RIGHT BREAST

[L CC synth-2D]
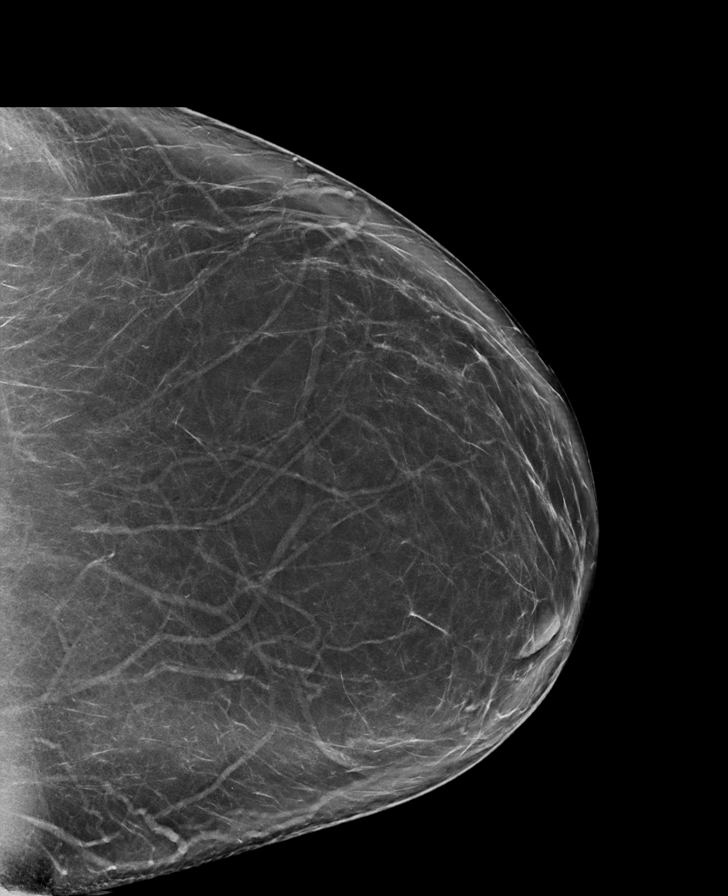

[L CV synth-2D]
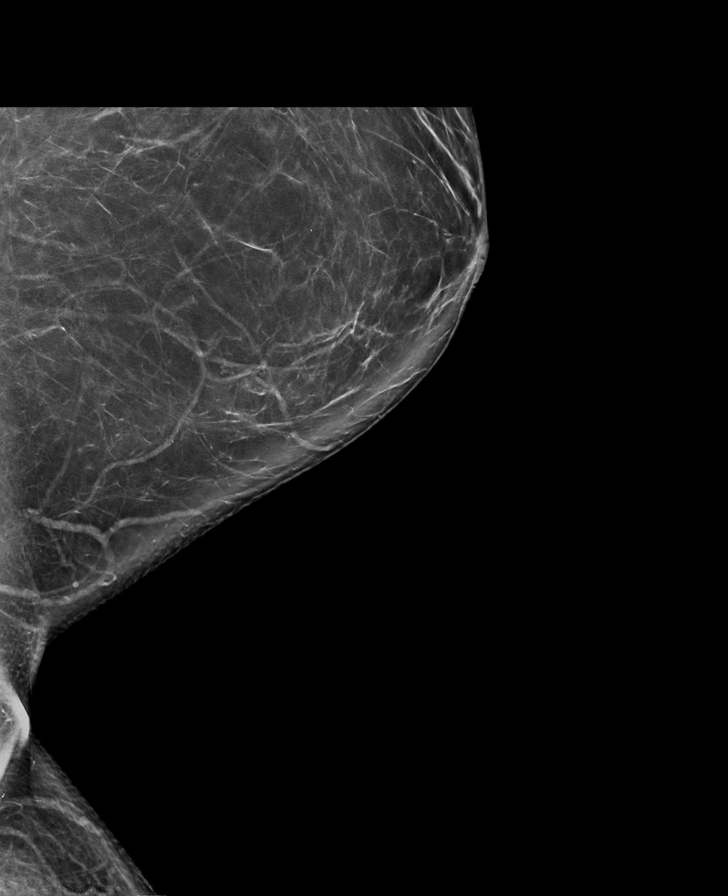

[R MLO synth-2D]
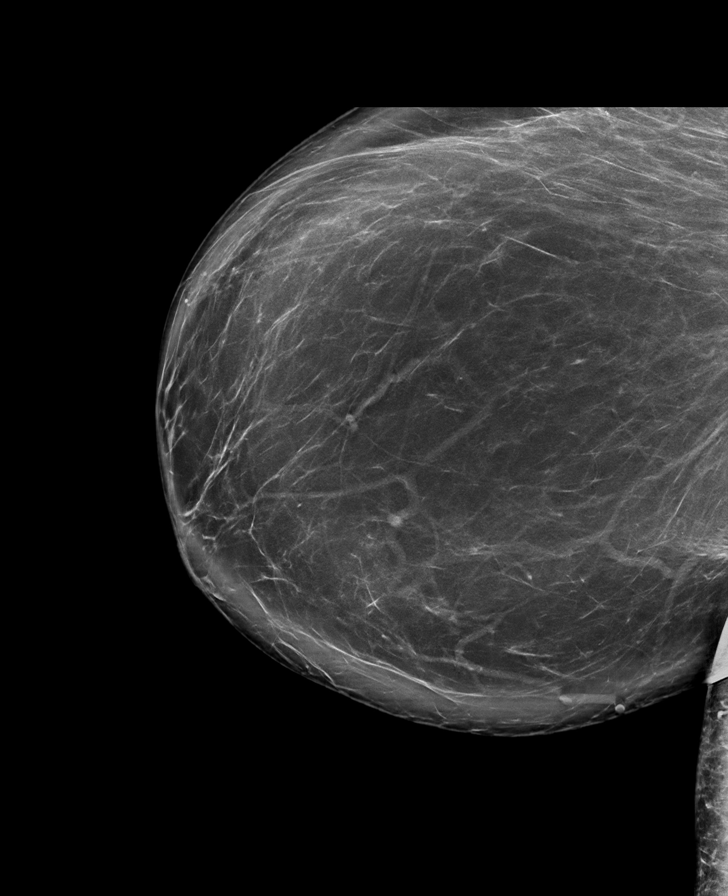

[R CV tomo · 2 of 91 frames shown]
[frame 30/91]
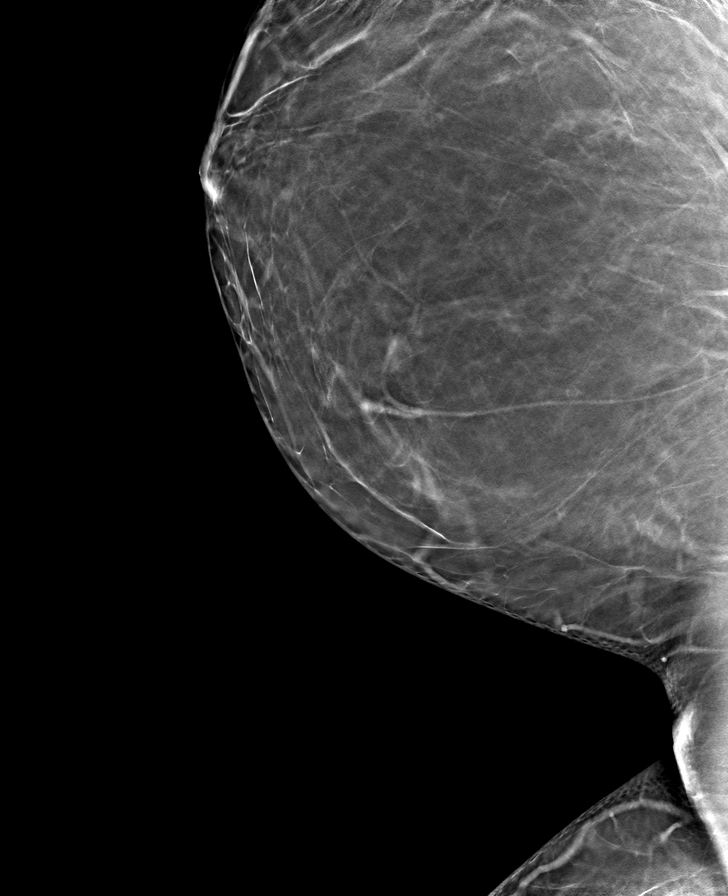
[frame 46/91]
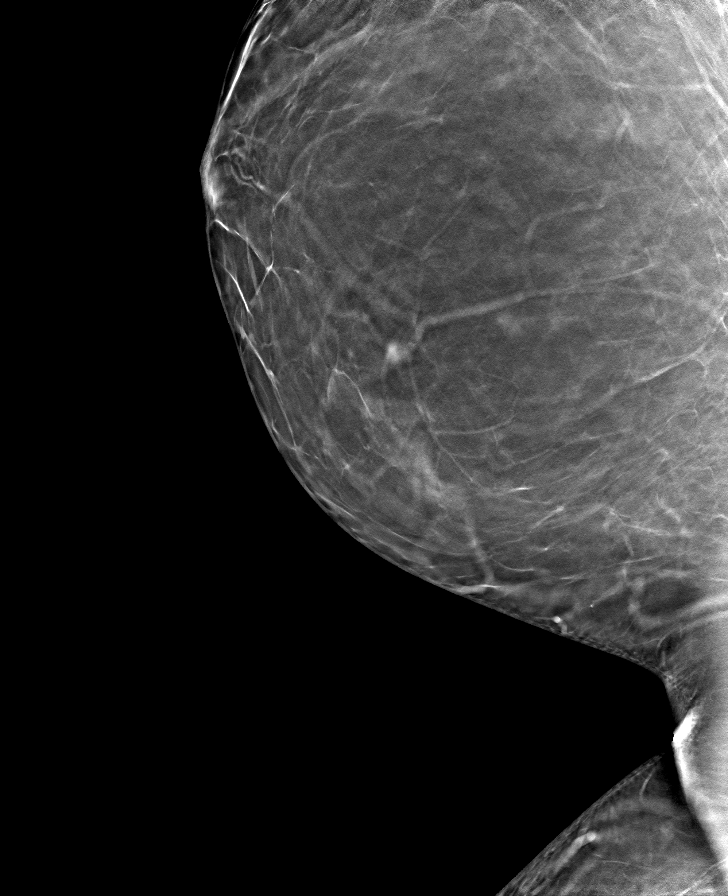

[L MLO tomo · tomo slice 57/114.0]
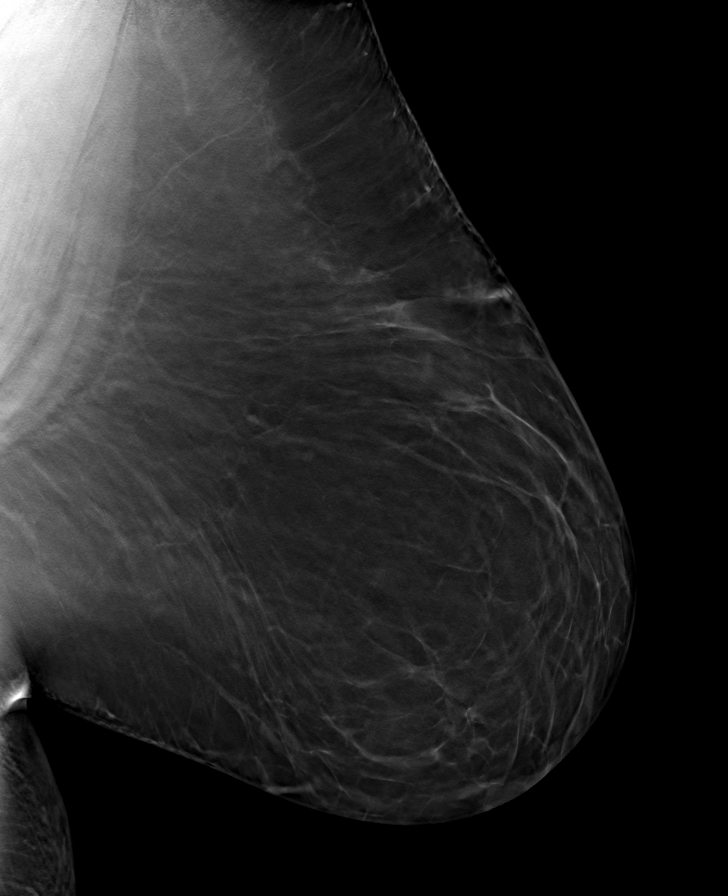

[L CV tomo · tomo slice 47/93.0]
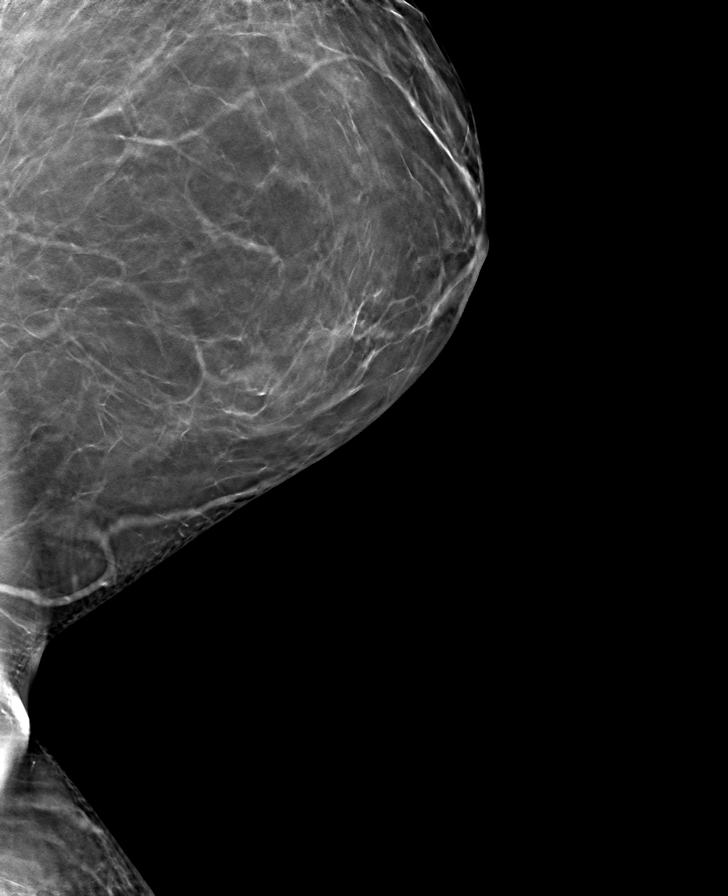

[R MLO tomo · tomo slice 60/119.0]
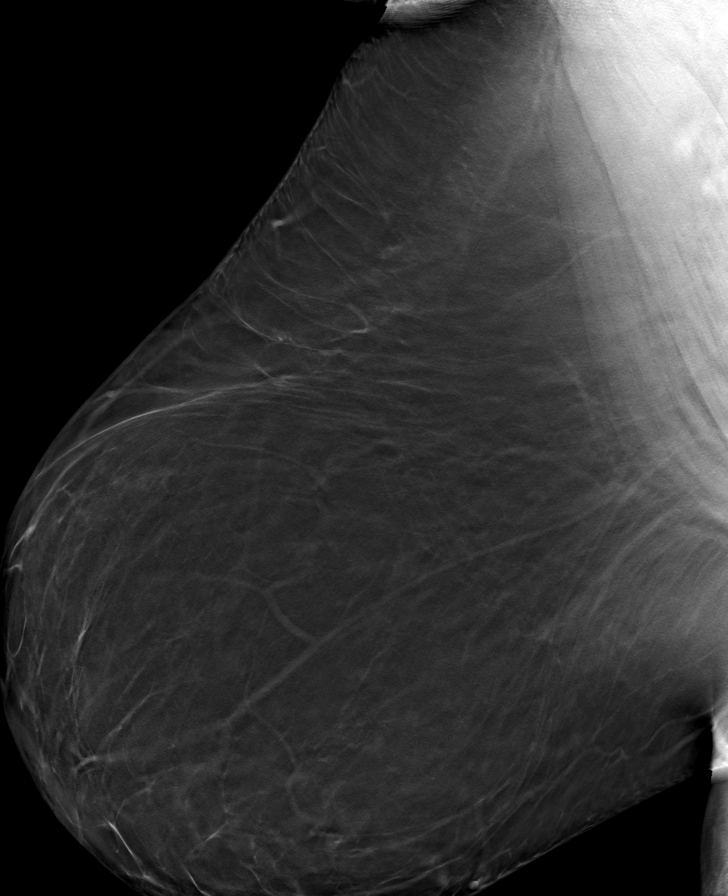

[8 of 23 positions shown; findings below may reference images not displayed]

FINDINGS: No suspicious mammographic findings are identified in either breast.
The parenchymal pattern is stable.

Mammographic images were processed with CAD.

On physical exam, there is patchy redness involving the skin of the
upper-outer quadrant of the right breast at anterior and middle
depth. No focal lumps are palpated.

Targeted ultrasound is performed, showing normal fibroglandular
tissue without focal or suspicious sonographic abnormality. There is
no associated edema or skin thickening. Evaluation of the upper
outer quadrant was performed.
IMPRESSION: 1. No mammographic evidence of malignancy in either breast.
2. No suspicious mammographic or sonographic findings corresponding
with the patient's right breast skin changes.

RECOMMENDATION:
1. Clinical follow-up recommended for the skin changes along the
upper-outer quadrant of the right breast. Any further workup should
be based on clinical grounds.
2. Screening mammogram at age 39 unless there are persistent or
intervening clinical concerns. (Code:PU-[id number])

I have discussed the findings and recommendations with the patient.
Results were also provided in writing at the conclusion of the
visit. If applicable, a reminder letter will be sent to the patient
regarding the next appointment.

BI-RADS CATEGORY  1: Negative.

## 2019-02-26 ENCOUNTER — Other Ambulatory Visit: Payer: Self-pay | Admitting: Cardiovascular Disease

## 2019-02-27 NOTE — Telephone Encounter (Signed)
Please schedule appointment with Dr. Gollan for refills. Thank you! 

## 2019-03-06 ENCOUNTER — Telehealth: Payer: Self-pay | Admitting: Cardiovascular Disease

## 2019-03-06 NOTE — Telephone Encounter (Signed)
Scheduled

## 2019-03-12 NOTE — Progress Notes (Signed)
Virtual Visit via Video Note   This visit type was conducted due to national recommendations for restrictions regarding the COVID-19 Pandemic (e.g. social distancing) in an effort to limit this patient's exposure and mitigate transmission in our community.  Due to her co-morbid illnesses, this patient is at least at moderate risk for complications without adequate follow up.  This format is felt to be most appropriate for this patient at this time.  All issues noted in this document were discussed and addressed.  A limited physical exam was performed with this format.  Please refer to the patient's chart for her consent to telehealth for [facility].   I connected with  Melanie AnisAshley N Cure on 03/13/19 by a video enabled telemedicine application and verified that I am speaking with the correct person using two identifiers. I discussed the limitations of evaluation and management by telemedicine. The patient expressed understanding and agreed to proceed.   Evaluation Performed:  Follow-up visit  Date:  03/13/2019   ID:  Melanie Anisshley N Bowers, DOB 02/01/1986, MRN [id number]  Patient Location:  [address] [place] [id number]   Provider location:   [facility], [place] office  PCP:  Margaretann LovelessBurnette, Jennifer M, [name]  Cardiologist:  Fonnie MuGollan, [facility]  Chief Complaint:  palpitations   History of Present Illness:    [name] is a 33 y.o. female who presents via audio/video conferencing for a telehealth visit today.   The patient does not symptoms concerning for COVID-19 infection (fever, chills, cough, or new SHORTNESS OF BREATH).   Patient has a past medical history of Smoker Reactive airway disease/asthma Morbid obesity Anxiety OSA, noncompliant with CPAP Who presents for follow-up of her fluid retention/leg swelling  Having palpitations Bad episode, lasted minutes,happened 2 weeks ago  Has had PAC and PVC before on monitor  Trying to control her stress  Works at [employer]  Low vit D in 07/2018  Previous stress echo September 2017  Dr. Welton FlakesKhan January 19, 2016 showing normal stress test breast attenuation artifact  CT scan chest was performed showing calcium score of 0 normal coronary arteries  Echocardiogram January 26, 2016 showing normal LV function normal right heart pressures mild TR mildly dilated left atrium Diastolic parameters normal  Previous Holter done through [facility] showing normal sinus rhythm APCs PVCs   chronic leg swelling    emergency [facility] 11/13/2017, shortness of breath Chest x-ray was unremarkable Lab work normal, D-dimer negative  Lab work reviewed Creatinine 0.76 BUN 12 Hemoglobin A1c 4.9 LDL 78   Prior CV studies:   The following studies were reviewed today:   Past Medical History:  Diagnosis Date  . Anxiety   . Depression   . Elevated blood pressure   . GERD (gastroesophageal reflux disease)   . Sleep apnea    Past Surgical History:  Procedure Laterality Date  . CESAREAN SECTION     at 28 weeks for pre-eclampsia, pulmonary edema, gestational DM.  Marland Kitchen. dilationand curettage of Uterus  2007   SAB  . Headache:2009     Headache wellness consult. during pregnancy     Allergies:   Lactose intolerance (gi), Penicillins, and Pineapple   Social History   Tobacco Use  . Smoking status: Current Some Day Smoker    Types: Cigars  . Smokeless tobacco: Never Used  . Tobacco comment: smokes cigars  Substance Use Topics  . Alcohol use: Yes    Alcohol/week: 0.0 standard drinks    Comment: ocassional  alcohol use; Mixed drinks once every 2-3 months  . Drug use: No     Current Outpatient Medications on File Prior to Visit  Medication Sig Dispense Refill  . albuterol (VENTOLIN HFA) 108 (90 Base) MCG/ACT inhaler Inhale 2 puffs into the lungs every 6 (six) hours as needed for wheezing or shortness of breath. 8 g 0  . baclofen (LIORESAL) 10 MG tablet Take 1 tablet (10 mg total) by mouth 3  (three) times daily. 30 each 0  . benzonatate (TESSALON PERLES) 100 MG capsule Take 1 capsule (100 mg total) by mouth 3 (three) times daily as needed. 20 capsule 0  . FLUoxetine (PROZAC) 20 MG tablet Take 1 tablet (20 mg total) by mouth daily. 30 tablet 1  . fluticasone furoate-vilanterol (BREO ELLIPTA) 100-25 MCG/INH AEPB Inhale 1 puff into the lungs daily. 60 each 11  . metroNIDAZOLE (METROGEL) 0.75 % vaginal gel Place 1 Applicatorful vaginally 2 (two) times daily. 70 g 0  . mometasone (ELOCON) 0.1 % cream Apply 1 application topically daily. 45 g 0  . montelukast (SINGULAIR) 10 MG tablet TAKE 1 TABLET BY MOUTH AT  BEDTIME 90 tablet 1  . naproxen (NAPROSYN) 500 MG tablet Take 1 tablet (500 mg total) by mouth 2 (two) times daily with a meal. 30 tablet 0  . nystatin cream (MYCOSTATIN) Apply 1 application topically 2 (two) times daily. 30 g 0  . omeprazole (PRILOSEC) 40 MG capsule Take 1 capsule (40 mg total) daily by mouth. 30 capsule 3  . phentermine (ADIPEX-P) 37.5 MG tablet Take 1 tablet (37.5 mg total) by mouth daily before breakfast. 30 tablet 2  . potassium chloride (K-DUR) 10 MEQ tablet Take 1 tablet (10 mEq total) by mouth daily. 90 tablet 1  . torsemide (DEMADEX) 20 MG tablet Take 2 tablets by mouth once daily 60 tablet 0  . Vitamin D, Ergocalciferol, (DRISDOL) 50000 units CAPS capsule Take 1 capsule (50,000 Units total) by mouth every 7 (seven) days. 12 capsule 1   No current facility-administered medications on file prior to visit.      Family Hx: The patient's family history includes Hypertension in her maternal grandmother.  ROS:   Please see the history of present illness.    Review of Systems  Constitutional: Negative.   HENT: Negative.   Respiratory: Negative.   Cardiovascular: Negative.   Gastrointestinal: Negative.   Musculoskeletal: Negative.   Neurological: Negative.   Psychiatric/Behavioral: Negative.   All other systems reviewed and are negative.    Labs/Other  Tests and Data Reviewed:    Recent Labs: No results found for requested labs within last 8760 hours.   Recent Lipid Panel Lab Results  Component Value Date/Time   CHOL 150 07/24/2017 10:17 AM   TRIG 51 07/24/2017 10:17 AM   HDL 62 07/24/2017 10:17 AM   CHOLHDL 2.8 06/24/2016 09:56 AM   LDLCALC 78 07/24/2017 10:17 AM    Wt Readings from Last 3 Encounters:  03/13/19 283 lb (128.4 kg)  02/04/19 283 lb 6.4 oz (128.5 kg)  11/07/18 297 lb 3.2 oz (134.8 kg)     Exam:    Vital Signs: Vital signs may also be detailed in the HPI BP 116/81   Pulse (!) 109   Ht 5\' 1"  (1.549 m)   Wt 283 lb (128.4 kg)   BMI 53.47 kg/m   Wt Readings from Last 3 Encounters:  03/13/19 283 lb (128.4 kg)  02/04/19 283 lb 6.4 oz (128.5 kg)  11/07/18 297 lb 3.2  oz (134.8 kg)   Temp Readings from Last 3 Encounters:  02/04/19 (!) 97.5 F (36.4 C) (Other (Comment))  11/07/18 (!) 97.5 F (36.4 C) (Oral)  08/08/18 98.3 F (36.8 C) (Oral)   BP Readings from Last 3 Encounters:  03/13/19 116/81  02/04/19 116/81  11/07/18 126/84   Pulse Readings from Last 3 Encounters:  03/13/19 (!) 109  02/04/19 (!) 109  11/07/18 (!) 106     Well nourished, well developed female in no acute distress. Constitutional:  oriented to person, place, and time. No distress.     ASSESSMENT & PLAN:    Problem List Items Addressed This Visit      Cardiology Problems   Chronic diastolic CHF (congestive heart failure) (HCC) - Primary    Other Visit Diagnoses    Morbid obesity (HCC)       Palpitations       Leg edema         Chronic diastolic CHF (congestive heart failure) (HCC) Feels euvolemic Working on her weight  Morbid obesity (HCC) Lost 15 pounds, Changed eating habits Periodically on phentermine  Palpitations - Previous Holter monitor 2017 showing APCs and PVCs more symptomatic Offered b-blocker , will start propranolol 20 mg 3 times daily as needed Prefers a zio monitor, order has been placed  Leg  edema Exacerbated by morbid obesity, sleep apnea, high fluid intake On torsemide   COVID-19 Education: The signs and symptoms of COVID-19 were discussed with the patient and how to seek care for testing (follow up with PCP or arrange E-visit).  The importance of social distancing was discussed today.  Patient Risk:   After full review of this patients clinical status, I feel that they are at least moderate risk at this time.  Time:   Today, I have spent 25 minutes with the patient with telehealth technology discussing the cardiac and medical problems/diagnoses detailed above   Additional 10 min spent reviewing the chart prior to patient visit today   Medication Adjustments/Labs and Tests Ordered: Current medicines are reviewed at length with the patient today.  Concerns regarding medicines are outlined above.   Tests Ordered: No tests ordered   Medication Changes: No changes made   Disposition: Follow-up in 12 months   Signed, Julien Nordmann, MD  [facility] [address], [place], [place] [address]

## 2019-03-13 ENCOUNTER — Telehealth (INDEPENDENT_AMBULATORY_CARE_PROVIDER_SITE_OTHER): Payer: Managed Care, Other (non HMO) | Admitting: Cardiovascular Disease

## 2019-03-13 ENCOUNTER — Encounter: Payer: Self-pay | Admitting: Cardiovascular Disease

## 2019-03-13 VITALS — BP 116/81 | HR 109 | Ht 61.0 in | Wt 283.0 lb

## 2019-03-13 DIAGNOSIS — I5032 Chronic diastolic (congestive) heart failure: Secondary | ICD-10-CM

## 2019-03-13 DIAGNOSIS — R600 Localized edema: Secondary | ICD-10-CM

## 2019-03-13 DIAGNOSIS — R002 Palpitations: Secondary | ICD-10-CM

## 2019-03-13 MED ORDER — PROPRANOLOL HCL 20 MG PO TABS: 20.0000 mg | ORAL_TABLET | Freq: Three times a day (TID) | ORAL | 5 refills | Status: DC | PRN

## 2019-03-13 NOTE — Patient Instructions (Addendum)
Zio as below   Medication Instructions:  Propranolol 20 mg TID PRN for palpitations. An Rx has been sent to your pharmacy.  If you need a refill on your cardiac medications before your next appointment, please call your pharmacy.    Lab work: We will talk with PMD about labs to order   If you have labs (blood work) drawn today and your tests are completely normal, you will receive your results only by: Marland Kitchen MyChart Message (if you have MyChart) OR . A paper copy in the mail If you have any lab test that is abnormal or we need to change your treatment, we will call you to review the results.   Testing/Procedures: Your physician has recommended that you wear an zio monitor. Zio-monitors are medical devices that record the heart's electrical activity. Doctors most often [place] these monitors to diagnose arrhythmias. Arrhythmias are problems with the speed or rhythm of the heartbeat. The monitor is a small, portable device. You can wear one while you do your normal daily activities. This is usually used to diagnose what is causing palpitations/syncope (passing out).  The monitor will be mailed to your home. Please follow the instructions to apply. It is to be worn for 14 days. They will provide a self addressed paid postage box that it is to used to return the monitor. Please contact zio customer service for an issues with the monitor. You will receive a call from a toll free telephone number to verify your mailing address before the monitor is mailed to you.    Follow-Up: At [facility], you and your health needs are our priority.  As part of our continuing mission to provide you with exceptional heart care, we have created designated Provider Care Teams.  These Care Teams include your primary Cardiologist (physician) and Advanced Practice Providers (APPs -  Physician Assistants and Nurse Practitioners) who all work together to provide you with the care you need, when you need it.  . You will  need a follow up appointment in 12 months .   Please call our office 2 months in advance to schedule this appointment.    . Providers on your designated Care Team:   . Murray Hodgkins, NP . Christell Faith, [name] . Marrianne Mood, [name]  Any Other Special Instructions Will Be Listed Below (If Applicable).  Your physician has recommended that you wear a Zio monitor. This monitor is a medical device that records the heart's electrical activity. Doctors most often use these monitors to diagnose arrhythmias. Arrhythmias are problems with the speed or rhythm of the heartbeat. The monitor is a small device applied to your chest. You can wear one while you do your normal daily activities. While wearing this monitor if you have any symptoms to push the button and record what you felt. Once you have worn this monitor for the period of time provider prescribed (Usually 14 days), you will return the monitor device in the postage paid box. Once it is returned they will download the data collected and provide [place] with a report which the provider will then review and we will call you with those results. Important tips:  1. Avoid showering during the first 24 hours of wearing the monitor. 2. Avoid excessive sweating to help maximize wear time. 3. Do not submerge the device, no hot tubs, and no swimming pools. 4. Keep any lotions or oils away from the patch. 5. After 24 hours you may shower with the patch on. Take brief  showers with your back facing the shower head.  6. Do not remove patch once it has been placed because that will interrupt data and decrease adhesive wear time. 7. Push the button when you have any symptoms and write down what you were feeling. 8. Once you have completed wearing your monitor, remove and place into box which has postage paid and place in your outgoing mailbox.  9. If for some reason you have misplaced your box then call our office and we can provide another box and/or mail it off for  you.        For educational health videos Log in to : [email or web address] Or : [email or web address], password : [email or web address]

## 2019-03-15 NOTE — Progress Notes (Signed)
Can we let patient know that Lillia Abed will be checking all her annual lab work at one time

## 2019-03-17 ENCOUNTER — Ambulatory Visit (INDEPENDENT_AMBULATORY_CARE_PROVIDER_SITE_OTHER): Payer: Managed Care, Other (non HMO)

## 2019-03-17 DIAGNOSIS — R002 Palpitations: Secondary | ICD-10-CM | POA: Diagnosis not present

## 2019-03-18 NOTE — Progress Notes (Signed)
Call to patient and made her aware that labs will be taken with Fenton Malling, PA and results will be shared with Dr. Rockey Situ.   No further orders at this time.   Advised pt to call for any further questions or concerns.

## 2019-04-15 ENCOUNTER — Other Ambulatory Visit: Payer: Self-pay

## 2019-04-30 NOTE — Progress Notes (Signed)
Patient: [name] Female    DOB: [birth year]   33 y.o.   MRN: [id number] Visit Date: 05/02/2019  Today's Provider: Mar Daring, [name]   Chief Complaint  Patient presents with  . Weight Loss   Subjective:     HPI   Weight Loss Patient presents today for weight loss. Patient is currently taking Phentermine 37.5 MG. Patient has gained 4 pounds over last 3 months. She has went from 283 pounds to 287 pounds. She reports knowing that she had not done as well. Has had 3 family members pass in a months time frame, and reports that since June she has had 7 family members pass (all but 2 have passed from Covid-19).   She is also here for her depo-provera shot.  Also mentions that the last time she was in the office she was having increased low back pain. She was given a depo-medrol IM injection. She reports this helped tremendously. Had pain relief and increased ROM for about 3 weeks after the injection. Is interested in treatment options.  Allergies  Allergen Reactions  . Lactose Intolerance (Gi) Diarrhea  . Penicillins Hives    Has patient had a PCN reaction causing immediate rash, facial/tongue/throat swelling, SOB or lightheadedness with hypotension: Yes Has patient had a PCN reaction causing severe rash involving mucus membranes or skin necrosis: No Has patient had a PCN reaction that required hospitalization No Has patient had a PCN reaction occurring within the last 10 years: Yes If all of the above answers are "NO", then may proceed with Cephalosporin use.  Marland Kitchen Pineapple Other (See Comments)    Patient states this cuts my mouth and makes it bleed.     Current Outpatient Medications:  .  albuterol (VENTOLIN HFA) 108 (90 Base) MCG/ACT inhaler, Inhale 2 puffs into the lungs every 6 (six) hours as needed for wheezing or shortness of breath., Disp: 8 g, Rfl: 0 .  baclofen (LIORESAL) 10 MG tablet, Take 1 tablet (10 mg total) by mouth 3 (three) times daily., Disp: 30  each, Rfl: 0 .  benzonatate (TESSALON PERLES) 100 MG capsule, Take 1 capsule (100 mg total) by mouth 3 (three) times daily as needed., Disp: 20 capsule, Rfl: 0 .  FLUoxetine (PROZAC) 20 MG tablet, Take 1 tablet (20 mg total) by mouth daily., Disp: 30 tablet, Rfl: 1 .  fluticasone furoate-vilanterol (BREO ELLIPTA) 100-25 MCG/INH AEPB, Inhale 1 puff into the lungs daily., Disp: 60 each, Rfl: 11 .  metroNIDAZOLE (METROGEL) 0.75 % vaginal gel, Place 1 Applicatorful vaginally 2 (two) times daily., Disp: 70 g, Rfl: 0 .  mometasone (ELOCON) 0.1 % cream, Apply 1 application topically daily., Disp: 45 g, Rfl: 0 .  montelukast (SINGULAIR) 10 MG tablet, TAKE 1 TABLET BY MOUTH AT  BEDTIME, Disp: 90 tablet, Rfl: 1 .  naproxen (NAPROSYN) 500 MG tablet, Take 1 tablet (500 mg total) by mouth 2 (two) times daily with a meal., Disp: 30 tablet, Rfl: 0 .  nystatin cream (MYCOSTATIN), Apply 1 application topically 2 (two) times daily., Disp: 30 g, Rfl: 0 .  omeprazole (PRILOSEC) 40 MG capsule, Take 1 capsule (40 mg total) daily by mouth., Disp: 30 capsule, Rfl: 3 .  phentermine (ADIPEX-P) 37.5 MG tablet, Take 1 tablet (37.5 mg total) by mouth daily before breakfast., Disp: 30 tablet, Rfl: 2 .  potassium chloride (K-DUR) 10 MEQ tablet, Take 1 tablet (10 mEq total) by mouth daily., Disp: 90 tablet, Rfl: 1 .  propranolol (INDERAL) 20 MG tablet, Take 1 tablet (20 mg total) by mouth 3 (three) times daily as needed. For palpitations, Disp: 90 tablet, Rfl: 5 .  torsemide (DEMADEX) 20 MG tablet, Take 2 tablets by mouth once daily, Disp: 60 tablet, Rfl: 0 .  Vitamin D, Ergocalciferol, (DRISDOL) 50000 units CAPS capsule, Take 1 capsule (50,000 Units total) by mouth every 7 (seven) days., Disp: 12 capsule, Rfl: 1  Review of Systems  Constitutional: Negative.   Respiratory: Negative.   Cardiovascular: Negative.   Neurological: Negative.   Psychiatric/Behavioral: Negative.     Social History   Tobacco Use  . Smoking  status: Current Some Day Smoker    Types: Cigars  . Smokeless tobacco: Never Used  . Tobacco comment: smokes cigars  Substance Use Topics  . Alcohol use: Yes    Alcohol/week: 0.0 standard drinks    Comment: ocassional alcohol use; Mixed drinks once every 2-3 months      Objective:   BP (!) 137/101 (BP Location: Left Wrist, Patient Position: Sitting, Cuff Size: Normal)   Pulse (!) 105   Temp (!) 96.9 F (36.1 C) (Temporal)   Wt 287 lb 3.2 oz (130.3 kg)   BMI 54.27 kg/m  Vitals:   05/02/19 0816  BP: (!) 137/101  Pulse: (!) 105  Temp: (!) 96.9 F (36.1 C)  TempSrc: Temporal  Weight: 287 lb 3.2 oz (130.3 kg)  Body mass index is 54.27 kg/m.   Physical Exam Vitals reviewed.  Constitutional:      General: She is not in acute distress.    Appearance: Normal appearance. She is well-developed. She is obese. She is not ill-appearing or diaphoretic.  Cardiovascular:     Rate and Rhythm: Regular rhythm. Tachycardia present.     Pulses: Normal pulses.     Heart sounds: Normal heart sounds. No murmur. No friction rub. No gallop.   Pulmonary:     Effort: Pulmonary effort is normal. No respiratory distress.     Breath sounds: Normal breath sounds. No wheezing or rales.  Musculoskeletal:     Cervical back: Normal range of motion and neck supple.  Neurological:     Mental Status: She is alert.      No results found for any visits on 05/02/19.     Assessment & Plan    1. Class 3 severe obesity due to excess calories with serious comorbidity and body mass index (BMI) of 50.0 to 59.9 in adult [facility]) Uses Phentermine intermittently. Had been off for a about a month, but recently started back. BP today is elevated. Advised to monitor BP at home and if consistently elevated will need to discontinue phentermine and consider another weight loss medication. She agrees.   2. Chronic bilateral low back pain without sciatica Had some relief from depo-medrol injection. Will give medrol  dose pak as below and refer to Physiatry as she may benefit from possible ESI since she had such relief from an IM injection. Referral placed as below.  - methylPREDNISolone (MEDROL) 4 MG TBPK tablet; 6 day taper; take as directed on package instructions  Dispense: 21 tablet; Refill: 0 - Ambulatory referral to Physical Medicine Rehab  3. Encounter for surveillance of injectable contraceptive Depo-provera injection given today. Return in 3 months for next injection.  - medroxyPROGESTERone (DEPO-PROVERA) injection 150 mg     Margaretann Loveless, [name]  [facility]

## 2019-05-02 ENCOUNTER — Encounter: Payer: Self-pay | Admitting: Physician Assistant

## 2019-05-02 ENCOUNTER — Other Ambulatory Visit: Payer: Self-pay

## 2019-05-02 ENCOUNTER — Telehealth: Payer: Self-pay | Admitting: Cardiovascular Disease

## 2019-05-02 ENCOUNTER — Ambulatory Visit (INDEPENDENT_AMBULATORY_CARE_PROVIDER_SITE_OTHER): Payer: Managed Care, Other (non HMO) | Admitting: Physician Assistant

## 2019-05-02 VITALS — BP 137/101 | HR 105 | Temp 96.9°F | Wt 287.2 lb

## 2019-05-02 DIAGNOSIS — M545 Low back pain, unspecified: Secondary | ICD-10-CM

## 2019-05-02 DIAGNOSIS — Z6841 Body Mass Index (BMI) 40.0 and over, adult: Secondary | ICD-10-CM

## 2019-05-02 DIAGNOSIS — Z3042 Encounter for surveillance of injectable contraceptive: Secondary | ICD-10-CM | POA: Diagnosis not present

## 2019-05-02 DIAGNOSIS — G8929 Other chronic pain: Secondary | ICD-10-CM

## 2019-05-02 MED ORDER — METHYLPREDNISOLONE 4 MG PO TBPK: ORAL_TABLET | ORAL | 0 refills | Status: DC

## 2019-05-02 MED ORDER — MEDROXYPROGESTERONE ACETATE 150 MG/ML IM SUSP
150.0000 mg | Freq: Once | INTRAMUSCULAR | Status: AC
  Administered 2019-05-02: 150 mg via INTRAMUSCULAR

## 2019-05-02 NOTE — Telephone Encounter (Signed)
Patient calling to discuss recent zio testing results   Please call    

## 2019-05-02 NOTE — Telephone Encounter (Signed)
Monitor uploaded on 04/15/19 for Dr. Rockey Situ to review. Message forwarded to MD to please sign off.

## 2019-05-02 NOTE — Patient Instructions (Signed)

## 2019-05-21 ENCOUNTER — Encounter: Payer: Self-pay | Admitting: Physician Assistant

## 2019-06-07 ENCOUNTER — Encounter: Payer: Self-pay | Admitting: Physician Assistant

## 2019-07-09 ENCOUNTER — Telehealth: Payer: Self-pay | Admitting: Physician Assistant

## 2019-07-09 NOTE — Telephone Encounter (Signed)
Patient, Cassadi Purdie left at the front desk 3 sets of stapled FMLA paperwork for Joycelyn Man to fill out for her.    The forms were placed in Jenni's inbox.  Thanks, [employer]

## 2019-07-09 NOTE — Telephone Encounter (Signed)
FYI

## 2019-07-25 NOTE — Progress Notes (Signed)
[name]  MRN: [id number] DOB: 18-Jun-1985  Subjective:  HPI   The patient is a 34 year old female who presents for weight loss.  She is currently on Phenteremine 37.5 mg daily.  She was last seen on 05/02/19 for this and at that time her weight had increased.  Patient was under stressful circumstances and believed it to be a factor in the weight increase. Patient continues to gain weight.  Wt Readings from Last 3 Encounters:  07/26/19 294 lb (133.4 kg)  05/02/19 287 lb 3.2 oz (130.3 kg)  03/13/19 283 lb (128.4 kg)    Patient is also here for her Depo Provera injection also.   Patient Active Problem List   Diagnosis Date Noted  . Chronic diastolic CHF (congestive heart failure) (HCC) 08/08/2018  . Depression, major, single episode, mild (HCC) 08/08/2018  . Asthma exacerbation 12/28/2017  . Benign essential HTN 05/04/2016  . History of pre-eclampsia 02/12/2015  . Abnormal vaginal Pap smear 02/05/2015  . Abortion, spontaneous 02/05/2015  . Anxiety 02/05/2015  . LBP (low back pain) 02/05/2015  . BMI 50.0-59.9, adult (HCC) 02/05/2015  . Fatigue 02/05/2015  . Fibrositis 02/05/2015  . Folliculitis 02/05/2015  . Acid reflux 02/05/2015  . History of diabetes mellitus arising in pregnancy 02/05/2015  . High risk sexual behavior 02/05/2015  . 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/2015  . Morbidly obese (HCC) 02/05/2015  . Obstructive sleep apnea 02/05/2015  . Vitamin D deficiency 02/05/2015  . Depression 02/05/2015  . Fibromyalgia 02/05/2015  . Abnormal C-reactive protein 10/24/2012  . Chronic LBP 08/13/2012  . Absence of menstruation 09/22/2009  . Migraine without status migrainosus 07/22/2008    Past Medical History:  Diagnosis Date  . Anxiety   . Depression   . Elevated blood pressure   . GERD (gastroesophageal reflux disease)   . Sleep apnea     Social History   Socioeconomic History  . Marital status: Single    Spouse name: Not on file  . Number of children: 1  .  Years of education: high schoo  . Highest education level: Not on file  Occupational History  . Not on file  Tobacco Use  . Smoking status: Current Some Day Smoker    Types: Cigars  . Smokeless tobacco: Never Used  . Tobacco comment: smokes cigars  Substance and Sexual Activity  . Alcohol use: Yes    Alcohol/week: 0.0 standard drinks    Comment: ocassional alcohol use; Mixed drinks once every 2-3 months  . Drug use: No  . Sexual activity: Yes  Other Topics Concern  . Not on file  Social History Narrative  . Not on file   Social Determinants of Health   Financial Resource Strain:   . Difficulty of Paying Living Expenses: Not on file  Food Insecurity:   . Worried About Programme researcher, broadcasting/film/video in the Last Year: Not on file  . Ran Out of Food in the Last Year: Not on file  Transportation Needs:   . Lack of Transportation (Medical): Not on file  . Lack of Transportation (Non-Medical): Not on file  Physical Activity:   . Days of Exercise per Week: Not on file  . Minutes of Exercise per Session: Not on file  Stress:   . Feeling of Stress : Not on file  Social Connections:   . Frequency of Communication with Friends and Family: Not on file  . Frequency of Social Gatherings with Friends and Family: Not on file  . Attends Religious  Services: Not on file  . Active Member of Clubs or Organizations: Not on file  . Attends Banker Meetings: Not on file  . Marital Status: Not on file  Intimate Partner Violence:   . Fear of Current or Ex-Partner: Not on file  . Emotionally Abused: Not on file  . Physically Abused: Not on file  . Sexually Abused: Not on file    Outpatient Encounter Medications as of 07/26/2019  Medication Sig  . albuterol (VENTOLIN HFA) 108 (90 Base) MCG/ACT inhaler Inhale 2 puffs into the lungs every 6 (six) hours as needed for wheezing or shortness of breath.  . baclofen (LIORESAL) 10 MG tablet Take 1 tablet (10 mg total) by mouth 3 (three) times daily.    Marland Kitchen FLUoxetine (PROZAC) 20 MG tablet Take 1 tablet (20 mg total) by mouth daily.  . fluticasone furoate-vilanterol (BREO ELLIPTA) 100-25 MCG/INH AEPB Inhale 1 puff into the lungs daily.  . metroNIDAZOLE (METROGEL) 0.75 % vaginal gel Place 1 Applicatorful vaginally 2 (two) times daily.  . mometasone (ELOCON) 0.1 % cream Apply 1 application topically daily.  . montelukast (SINGULAIR) 10 MG tablet TAKE 1 TABLET BY MOUTH AT  BEDTIME  . naproxen (NAPROSYN) 500 MG tablet Take 1 tablet (500 mg total) by mouth 2 (two) times daily with a meal.  . nystatin cream (MYCOSTATIN) Apply 1 application topically 2 (two) times daily.  Marland Kitchen omeprazole (PRILOSEC) 40 MG capsule Take 1 capsule (40 mg total) daily by mouth.  . phentermine (ADIPEX-P) 37.5 MG tablet Take 1 tablet (37.5 mg total) by mouth daily before breakfast.  . potassium chloride (K-DUR) 10 MEQ tablet Take 1 tablet (10 mEq total) by mouth daily.  . propranolol (INDERAL) 20 MG tablet Take 1 tablet (20 mg total) by mouth 3 (three) times daily as needed. For palpitations  . torsemide (DEMADEX) 20 MG tablet Take 2 tablets by mouth once daily  . Vitamin D, Ergocalciferol, (DRISDOL) 50000 units CAPS capsule Take 1 capsule (50,000 Units total) by mouth every 7 (seven) days.  . [DISCONTINUED] methylPREDNISolone (MEDROL) 4 MG TBPK tablet 6 day taper; take as directed on package instructions  . Insulin Pen Needle (BD PEN NEEDLE NANO 2ND GEN) 32G X 4 MM MISC To use daily with saxenda injection  . Liraglutide -Weight Management (SAXENDA) 18 MG/3ML SOPN Inject 0.1 mLs (0.6 mg total) into the skin daily. Increase by 0.6mg  weekly until 3mg  daily achieved.  . [DISCONTINUED] benzonatate (TESSALON PERLES) 100 MG capsule Take 1 capsule (100 mg total) by mouth 3 (three) times daily as needed.  . [EXPIRED] medroxyPROGESTERone (DEPO-PROVERA) injection 150 mg    No facility-administered encounter medications on file as of 07/26/2019.    Allergies  Allergen Reactions  .  Lactose Intolerance (Gi) Diarrhea  . Penicillins Hives    Has patient had a PCN reaction causing immediate rash, facial/tongue/throat swelling, SOB or lightheadedness with hypotension: Yes Has patient had a PCN reaction causing severe rash involving mucus membranes or skin necrosis: No Has patient had a PCN reaction that required hospitalization No Has patient had a PCN reaction occurring within the last 10 years: Yes If all of the above answers are "NO", then may proceed with Cephalosporin use.  09/25/2019 Pineapple Other (See Comments)    Patient states this cuts my mouth and makes it bleed.    Review of Systems  Constitutional: Negative for chills, diaphoresis, fever and malaise/fatigue.  HENT: Negative for congestion, sinus pain and sore throat.   Respiratory: Negative for cough  and shortness of breath.   Cardiovascular: Negative for chest pain.  Gastrointestinal: Negative for abdominal pain and diarrhea.  Musculoskeletal: Negative for myalgias.  Neurological: Negative for headaches.    Objective:  BP 122/80 (BP Location: Left Arm, Patient Position: Sitting, Cuff Size: Normal) Comment: done on wrist, due to size  Pulse (!) 52   Temp (!) 96.9 F (36.1 C) (Skin)   Wt 294 lb (133.4 kg)   SpO2 98%   BMI 55.55 kg/m   Physical Exam  Constitutional: She is well-developed, well-nourished, and in no distress. Vital signs are normal. No distress.  HENT:  Head: Normocephalic and atraumatic.  Cardiovascular: Normal rate, regular rhythm and intact distal pulses.  No murmur heard. Pulmonary/Chest: Effort normal and breath sounds normal. No respiratory distress.  Neurological: She is alert. Gait normal.  Skin: Skin is warm and dry. She is not diaphoretic.  Psychiatric: Mood, memory, affect and judgment normal.  Vitals reviewed.   Assessment and Plan :   1. Encounter for weight loss counseling Has tried multiple times and failed phentermine. Patient also prediabetic with heart failure. Would  be best served using Saxenda for weight loss as this is safer profile. First saxenda injection was started today in the office with instructions on use. Will send Rx to pharmacy. I will f/u in 3 months to make sure she is tolerating and doing well. - Liraglutide -Weight Management (SAXENDA) 18 MG/3ML SOPN; Inject 0.1 mLs (0.6 mg total) into the skin daily. Increase by 0.6mg  weekly until 3mg  daily achieved.  Dispense: 15 mL; Refill: 5 - Insulin Pen Needle (BD PEN NEEDLE NANO 2ND GEN) 32G X 4 MM MISC; To use daily with saxenda injection  Dispense: 100 each; Refill: 3  2. Morbid obesity with body mass index (BMI) of 50.0 to 59.9 in adult [facility]) See above medical treatment plan. - Liraglutide -Weight Management (SAXENDA) 18 MG/3ML SOPN; Inject 0.1 mLs (0.6 mg total) into the skin daily. Increase by 0.6mg  weekly until 3mg  daily achieved.  Dispense: 15 mL; Refill: 5 - Insulin Pen Needle (BD PEN NEEDLE NANO 2ND GEN) 32G X 4 MM MISC; To use daily with saxenda injection  Dispense: 100 each; Refill: 3  3. Prediabetes See above medical treatment plan. - Liraglutide -Weight Management (SAXENDA) 18 MG/3ML SOPN; Inject 0.1 mLs (0.6 mg total) into the skin daily. Increase by 0.6mg  weekly until 3mg  daily achieved.  Dispense: 15 mL; Refill: 5 - Insulin Pen Needle (BD PEN NEEDLE NANO 2ND GEN) 32G X 4 MM MISC; To use daily with saxenda injection  Dispense: 100 each; Refill: 3  4. Chronic diastolic CHF (congestive heart failure) (HCC) Stable. Continue torsemide 20mg  and potassium with torsemide.   5. Major depressive disorder with single episode, in full remission ([place]) Stable. Continue Fluoxetine 20mg .   6. Encounter for surveillance of injectable contraceptive Depo-provera IM given without issue. Return in 3 months.  - medroxyPROGESTERone (DEPO-PROVERA) injection 150 mg

## 2019-07-26 ENCOUNTER — Other Ambulatory Visit: Payer: Self-pay

## 2019-07-26 ENCOUNTER — Encounter: Payer: Self-pay | Admitting: Physician Assistant

## 2019-07-26 ENCOUNTER — Ambulatory Visit (INDEPENDENT_AMBULATORY_CARE_PROVIDER_SITE_OTHER): Payer: Managed Care, Other (non HMO) | Admitting: Physician Assistant

## 2019-07-26 VITALS — BP 122/80 | HR 52 | Temp 96.9°F | Wt 294.0 lb

## 2019-07-26 DIAGNOSIS — I5032 Chronic diastolic (congestive) heart failure: Secondary | ICD-10-CM | POA: Diagnosis not present

## 2019-07-26 DIAGNOSIS — R7303 Prediabetes: Secondary | ICD-10-CM

## 2019-07-26 DIAGNOSIS — F325 Major depressive disorder, single episode, in full remission: Secondary | ICD-10-CM

## 2019-07-26 DIAGNOSIS — Z3042 Encounter for surveillance of injectable contraceptive: Secondary | ICD-10-CM | POA: Diagnosis not present

## 2019-07-26 DIAGNOSIS — Z713 Dietary counseling and surveillance: Secondary | ICD-10-CM | POA: Diagnosis not present

## 2019-07-26 DIAGNOSIS — Z6841 Body Mass Index (BMI) 40.0 and over, adult: Secondary | ICD-10-CM

## 2019-07-26 MED ORDER — SAXENDA 18 MG/3ML ~~LOC~~ SOPN: 0.6000 mg | PEN_INJECTOR | Freq: Every day | SUBCUTANEOUS | 5 refills | Status: DC

## 2019-07-26 MED ORDER — MEDROXYPROGESTERONE ACETATE 150 MG/ML IM SUSP
150.0000 mg | Freq: Once | INTRAMUSCULAR | Status: AC
  Administered 2019-07-26: 150 mg via INTRAMUSCULAR

## 2019-07-26 MED ORDER — BD PEN NEEDLE NANO 2ND GEN 32G X 4 MM MISC: 3 refills | Status: DC

## 2019-07-26 NOTE — Patient Instructions (Signed)
Liraglutide injection (Weight Management) What is this medicine? LIRAGLUTIDE (LIR a GLOO tide) is used to help people lose weight and maintain weight loss. It is used with a reduced-calorie diet and exercise. This medicine may be used for other purposes; ask your health care provider or pharmacist if you have questions. COMMON BRAND NAME(S): Saxenda What should I tell my health care provider before I take this medicine? They need to know if you have any of these conditions:  endocrine tumors (MEN 2) or if someone in your family had these tumors  gallbladder disease  high cholesterol  history of alcohol abuse problem  history of pancreatitis  kidney disease or if you are on dialysis  liver disease  previous swelling of the tongue, face, or lips with difficulty breathing, difficulty swallowing, hoarseness, or tightening of the throat  stomach problems  suicidal thoughts, plans, or attempt; a previous suicide attempt by you or a family member  thyroid cancer or if someone in your family had thyroid cancer  an unusual or allergic reaction to liraglutide, other medicines, foods, dyes, or preservatives  pregnant or trying to get pregnant  breast-feeding How should I use this medicine? This medicine is for injection under the skin of your upper leg, stomach area, or upper arm. You will be taught how to prepare and give this medicine. Use exactly as directed. Take your medicine at regular intervals. Do not take it more often than directed. This drug comes with INSTRUCTIONS FOR USE. Ask your pharmacist for directions on how to use this drug. Read the information carefully. Talk to your pharmacist or health care provider if you have questions. It is important that you put your used needles and syringes in a special sharps container. Do not put them in a trash can. If you do not have a sharps container, call your pharmacist or healthcare provider to get one. A special MedGuide will be  given to you by the pharmacist with each prescription and refill. Be sure to read this information carefully each time. Talk to your pediatrician regarding the use of this medicine in children. Special care may be needed. Overdosage: If you think you have taken too much of this medicine contact a poison control center or emergency room at once. NOTE: This medicine is only for you. Do not share this medicine with others. What if I miss a dose? If you miss a dose, take it as soon as you can. If it is almost time for your next dose, take only that dose. Do not take double or extra doses. If you miss your dose for 3 days or more, call your doctor or health care professional to talk about how to restart this medicine. What may interact with this medicine?  insulin and other medicines for diabetes This list may not describe all possible interactions. Give your health care provider a list of all the medicines, herbs, non-prescription drugs, or dietary supplements you use. Also tell them if you smoke, drink alcohol, or use illegal drugs. Some items may interact with your medicine. What should I watch for while using this medicine? Visit your doctor or health care professional for regular checks on your progress. Drink plenty of fluids while taking this medicine. Check with your doctor or health care professional if you get an attack of severe diarrhea, nausea, and vomiting. The loss of too much body fluid can make it dangerous for you to take this medicine. This medicine may affect blood sugar levels. Ask your healthcare   provider if changes in diet or medicines are needed if you have diabetes. Patients and their families should watch out for worsening depression or thoughts of suicide. Also watch out for sudden changes in feelings such as feeling anxious, agitated, panicky, irritable, hostile, aggressive, impulsive, severely restless, overly excited and hyperactive, or not being able to sleep. If this happens,  especially at the beginning of treatment or after a change in dose, call your health care professional. Women should inform their health care provider if they wish to become pregnant or think they might be pregnant. Losing weight while pregnant is not advised and may cause harm to the unborn child. Talk to your health care provider for more information. What side effects may I notice from receiving this medicine? Side effects that you should report to your doctor or health care professional as soon as possible:  allergic reactions like skin rash, itching or hives, swelling of the face, lips, or tongue  breathing problems  diarrhea that continues or is severe  lump or swelling on the neck  severe nausea  signs and symptoms of infection like fever or chills; cough; sore throat; pain or trouble passing urine  signs and symptoms of low blood sugar such as feeling anxious; confusion; dizziness; increased hunger; unusually weak or tired; increased sweating; shakiness; cold, clammy skin; irritable; headache; blurred vision; fast heartbeat; loss of consciousness  signs and symptoms of kidney injury like trouble passing urine or change in the amount of urine  trouble swallowing  unusual stomach upset or pain  vomiting Side effects that usually do not require medical attention (report to your doctor or health care professional if they continue or are bothersome):  constipation  decreased appetite  diarrhea  fatigue  headache  nausea  pain, redness, or irritation at site where injected  stomach upset  stuffy or runny nose This list may not describe all possible side effects. Call your doctor for medical advice about side effects. You may report side effects to FDA at 1-800-FDA-1088. Where should I keep my medicine? Keep out of the reach of children. Store unopened pen in a refrigerator between 2 and 8 degrees C (36 and 46 degrees F). Do not freeze or use if the medicine has been  frozen. Protect from light and excessive heat. After you first use the pen, it can be stored at room temperature between 15 and 30 degrees C (59 and 86 degrees F) or in a refrigerator. Throw away your used pen after 30 days or after the expiration date, whichever comes first. Do not store your pen with the needle attached. If the needle is left on, medicine may leak from the pen. NOTE: This sheet is a summary. It may not cover all possible information. If you have questions about this medicine, talk to your doctor, pharmacist, or health care provider.  2020 Elsevier/Gold Standard (2019-03-14 21:16:59)  

## 2019-07-26 NOTE — Telephone Encounter (Signed)
Forms completed, faxed and given to patient

## 2019-08-02 ENCOUNTER — Encounter: Payer: Self-pay | Admitting: Physician Assistant

## 2019-08-14 ENCOUNTER — Telehealth: Payer: Self-pay

## 2019-08-14 NOTE — Telephone Encounter (Signed)
Copied from CRM [id number]. Topic: General - Other >> Aug 14, 2019 10:39 AM Dalphine Handing A wrote: Patient would like a callback or mychart message with a status update on the prior authorization for her saxenda medication.

## 2019-08-15 NOTE — Telephone Encounter (Signed)
Prior Authorization done waiting on response.

## 2019-08-19 ENCOUNTER — Encounter: Payer: Self-pay | Admitting: Physician Assistant

## 2019-09-16 ENCOUNTER — Encounter: Payer: Self-pay | Admitting: Physician Assistant

## 2019-09-16 DIAGNOSIS — R110 Nausea: Secondary | ICD-10-CM

## 2019-09-16 MED ORDER — ONDANSETRON HCL 4 MG PO TABS: 4.0000 mg | ORAL_TABLET | Freq: Three times a day (TID) | ORAL | 5 refills | Status: DC | PRN

## 2019-10-03 ENCOUNTER — Encounter: Payer: Self-pay | Admitting: Physician Assistant

## 2019-10-03 NOTE — Progress Notes (Signed)
MyChart Video Visit    Virtual Visit via Video Note   This visit type was conducted due to national recommendations for restrictions regarding the COVID-19 Pandemic (e.g. social distancing) in an effort to limit this patient's exposure and mitigate transmission in our community. This patient is at least at moderate risk for complications without adequate follow up. This format is felt to be most appropriate for this patient at this time. Physical exam was limited by quality of the video and audio technology used for the visit.   Patient location: home Provider location: [facility]   Patient: [name]   DOB: Feb 19, 1986   34 y.o. Female  MRN: [id number] Visit Date: 10/04/2019  Today's healthcare provider: Mar Daring, [name]   Chief Complaint  Patient presents with  . URI   Subjective    URI  This is a new problem. The current episode started 1 to 4 weeks ago. The problem has been unchanged. There has been no fever. Associated symptoms include congestion, coughing ("slight"), ear pain ("Bilateral sharp pain"), sinus pain and sneezing. Pertinent negatives include no chest pain, diarrhea, headaches, nausea, rhinorrhea, vomiting or wheezing. Sore throat: "scratchy throat""Irritated" Treatments tried: Alka Setlzer cold, cough drop. The treatment provided no relief.    Patient Active Problem List   Diagnosis Date Noted  . Chronic diastolic CHF (congestive heart failure) ([place]) 08/08/2018  . Depression, major, single episode, mild ([place]) 08/08/2018  . Asthma exacerbation 12/28/2017  . Benign essential HTN 05/04/2016  . History of pre-eclampsia 02/12/2015  . Abnormal vaginal Pap smear 02/05/2015  . Abortion, spontaneous 02/05/2015  . Anxiety 02/05/2015  . LBP (low back pain) 02/05/2015  . BMI 50.0-59.9, adult ([place]) 02/05/2015  . Fatigue 02/05/2015  . Fibrositis 02/05/2015  . Folliculitis 27/74/1287  . Acid reflux 02/05/2015  . History of diabetes mellitus arising in  pregnancy 02/05/2015  . High risk sexual behavior 02/05/2015  . 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/2015  . Morbidly obese ([place]) 02/05/2015  . Obstructive sleep apnea 02/05/2015  . Vitamin D deficiency 02/05/2015  . Depression 02/05/2015  . Fibromyalgia 02/05/2015  . Abnormal C-reactive protein 10/24/2012  . Chronic LBP 08/13/2012  . Absence of menstruation 09/22/2009  . Migraine without status migrainosus 07/22/2008   Past Medical History:  Diagnosis Date  . Anxiety   . Depression   . Elevated blood pressure   . GERD (gastroesophageal reflux disease)   . Sleep apnea       Medications: Outpatient Medications Prior to Visit  Medication Sig  . albuterol (VENTOLIN HFA) 108 (90 Base) MCG/ACT inhaler Inhale 2 puffs into the lungs every 6 (six) hours as needed for wheezing or shortness of breath.  Marland Kitchen FLUoxetine (PROZAC) 20 MG tablet Take 1 tablet (20 mg total) by mouth daily.  . fluticasone furoate-vilanterol (BREO ELLIPTA) 100-25 MCG/INH AEPB Inhale 1 puff into the lungs daily.  . Insulin Pen Needle (BD PEN NEEDLE NANO 2ND GEN) 32G X 4 MM MISC To use daily with saxenda injection  . Liraglutide -Weight Management (SAXENDA) 18 MG/3ML SOPN Inject 0.1 mLs (0.6 mg total) into the skin daily. Increase by 0.6mg  weekly until 3mg  daily achieved.  . metroNIDAZOLE (METROGEL) 0.75 % vaginal gel Place 1 Applicatorful vaginally 2 (two) times daily.  . mometasone (ELOCON) 0.1 % cream Apply 1 application topically daily.  . montelukast (SINGULAIR) 10 MG tablet TAKE 1 TABLET BY MOUTH AT  BEDTIME  . nystatin cream (MYCOSTATIN) Apply 1 application topically 2 (two) times daily.  Marland Kitchen omeprazole (PRILOSEC)  40 MG capsule Take 1 capsule (40 mg total) daily by mouth.  . ondansetron (ZOFRAN) 4 MG tablet Take 1 tablet (4 mg total) by mouth every 8 (eight) hours as needed.  . propranolol (INDERAL) 20 MG tablet Take 1 tablet (20 mg total) by mouth 3 (three) times daily as needed. For palpitations  . torsemide (DEMADEX) 20 MG  tablet Take 2 tablets by mouth once daily  . baclofen (LIORESAL) 10 MG tablet Take 1 tablet (10 mg total) by mouth 3 (three) times daily.  . naproxen (NAPROSYN) 500 MG tablet Take 1 tablet (500 mg total) by mouth 2 (two) times daily with a meal.  . potassium chloride (K-DUR) 10 MEQ tablet Take 1 tablet (10 mEq total) by mouth daily. (Patient not taking: Reported on 10/04/2019)  . Vitamin D, Ergocalciferol, (DRISDOL) 50000 units CAPS capsule Take 1 capsule (50,000 Units total) by mouth every 7 (seven) days.   No facility-administered medications prior to visit.    Review of Systems  Constitutional: Negative for chills, fatigue and fever.  HENT: Positive for congestion, ear pain ("Bilateral sharp pain"), postnasal drip, sinus pressure, sinus pain, sneezing and tinnitus. Negative for rhinorrhea, trouble swallowing and voice change. Sore throat: "scratchy throat""Irritated"   Respiratory: Positive for cough ("slight") and chest tightness ("slightly congested"). Negative for shortness of breath and wheezing.   Cardiovascular: Negative for chest pain and leg swelling.  Gastrointestinal: Negative for diarrhea, nausea and vomiting.  Neurological: Negative for dizziness, light-headedness and headaches.    Last CBC Lab Results  Component Value Date   WBC 6.2 07/24/2017   HGB 12.5 07/24/2017   HCT 37.5 07/24/2017   MCV 93 07/24/2017   MCH 30.9 07/24/2017   RDW 13.6 07/24/2017   PLT 275 07/24/2017   Last metabolic panel Lab Results  Component Value Date   GLUCOSE 76 07/24/2017   NA 140 07/24/2017   K 4.1 07/24/2017   CL 103 07/24/2017   CO2 20 07/24/2017   BUN 12 07/24/2017   CREATININE 0.76 07/24/2017   GFRNONAA 104 07/24/2017   GFRAA 120 07/24/2017   CALCIUM 9.2 07/24/2017   PROT 7.4 07/24/2017   ALBUMIN 4.3 07/24/2017   LABGLOB 3.1 07/24/2017   AGRATIO 1.4 07/24/2017   BILITOT 0.3 07/24/2017   ALKPHOS 96 07/24/2017   AST 16 07/24/2017   ALT 15 07/24/2017   ANIONGAP 10  12/15/2016      Objective    Temp 98.2 F (36.8 C) Comment: yesterday BP Readings from Last 3 Encounters:  07/26/19 122/80  05/02/19 (!) 137/101  03/13/19 116/81   Wt Readings from Last 3 Encounters:  07/26/19 294 lb (133.4 kg)  05/02/19 287 lb 3.2 oz (130.3 kg)  03/13/19 283 lb (128.4 kg)      Physical Exam     Assessment & Plan     1. Acute non-recurrent pansinusitis Worsening symptoms that have not responded to OTC medications. Will give Doxycycline as below. Continue allergy medications. Stay well hydrated and get plenty of rest. Call if no symptom improvement or if symptoms worsen. - doxycycline (VIBRA-TABS) 100 MG tablet; Take 1 tablet (100 mg total) by mouth 2 (two) times daily.  Dispense: 20 tablet; Refill: 0  2. Antibiotic-induced yeast infection Gets yeast infections with antibiotics. Diflucan given as below.  - fluconazole (DIFLUCAN) 150 MG tablet; Take 1 tablet (150 mg total) by mouth once for 1 dose. May repeat in 48-72 hrs if needed  Dispense: 2 tablet; Refill: 0   No follow-ups on file.  I discussed the assessment and treatment plan with the patient. The patient was provided an opportunity to ask questions and all were answered. The patient agreed with the plan and demonstrated an understanding of the instructions.   The patient was advised to call back or seek an in-person evaluation if the symptoms worsen or if the condition fails to improve as anticipated.  I provided 10 minutes of non-face-to-face time during this encounter.  Delmer Islam, [name], have reviewed all documentation for this visit. The documentation on 10/04/19 for the exam, diagnosis, procedures, and orders are all accurate and complete.  Reine Just [facility] [phone] (phone) [phone] (fax)  [facility]

## 2019-10-04 ENCOUNTER — Telehealth (INDEPENDENT_AMBULATORY_CARE_PROVIDER_SITE_OTHER): Payer: Managed Care, Other (non HMO) | Admitting: Physician Assistant

## 2019-10-04 ENCOUNTER — Encounter: Payer: Self-pay | Admitting: Physician Assistant

## 2019-10-04 VITALS — Temp 98.2°F

## 2019-10-04 DIAGNOSIS — B379 Candidiasis, unspecified: Secondary | ICD-10-CM | POA: Diagnosis not present

## 2019-10-04 DIAGNOSIS — T3695XA Adverse effect of unspecified systemic antibiotic, initial encounter: Secondary | ICD-10-CM

## 2019-10-04 DIAGNOSIS — J0140 Acute pansinusitis, unspecified: Secondary | ICD-10-CM | POA: Diagnosis not present

## 2019-10-04 MED ORDER — FLUCONAZOLE 150 MG PO TABS: 150.0000 mg | ORAL_TABLET | Freq: Once | ORAL | 0 refills | Status: AC

## 2019-10-04 MED ORDER — DOXYCYCLINE HYCLATE 100 MG PO TABS: 100.0000 mg | ORAL_TABLET | Freq: Two times a day (BID) | ORAL | 0 refills | Status: DC

## 2019-10-04 NOTE — Patient Instructions (Signed)
Sinusitis, Adult Sinusitis is inflammation of your sinuses. Sinuses are hollow spaces in the bones around your face. Your sinuses are located:  Around your eyes.  In the middle of your forehead.  Behind your nose.  In your cheekbones. Mucus normally drains out of your sinuses. When your nasal tissues become inflamed or swollen, mucus can become trapped or blocked. This allows bacteria, viruses, and fungi to grow, which leads to infection. Most infections of the sinuses are caused by a virus. Sinusitis can develop quickly. It can last for up to 4 weeks (acute) or for more than 12 weeks (chronic). Sinusitis often develops after a cold. What are the causes? This condition is caused by anything that creates swelling in the sinuses or stops mucus from draining. This includes:  Allergies.  Asthma.  Infection from bacteria or viruses.  Deformities or blockages in your nose or sinuses.  Abnormal growths in the nose (nasal polyps).  Pollutants, such as chemicals or irritants in the air.  Infection from fungi (rare). What increases the risk? You are more likely to develop this condition if you:  Have a weak body defense system (immune system).  Do a lot of swimming or diving.  Overuse nasal sprays.  Smoke. What are the signs or symptoms? The main symptoms of this condition are pain and a feeling of pressure around the affected sinuses. Other symptoms include:  Stuffy nose or congestion.  Thick drainage from your nose.  Swelling and warmth over the affected sinuses.  Headache.  Upper toothache.  A cough that may get worse at night.  Extra mucus that collects in the throat or the back of the nose (postnasal drip).  Decreased sense of smell and taste.  Fatigue.  A fever.  Sore throat.  Bad breath. How is this diagnosed? This condition is diagnosed based on:  Your symptoms.  Your medical history.  A physical exam.  Tests to find out if your condition is  acute or chronic. This may include: ? Checking your nose for nasal polyps. ? Viewing your sinuses using a device that has a light (endoscope). ? Testing for allergies or bacteria. ? Imaging tests, such as an MRI or CT scan. In rare cases, a bone biopsy may be done to rule out more serious types of fungal sinus disease. How is this treated? Treatment for sinusitis depends on the cause and whether your condition is chronic or acute.  If caused by a virus, your symptoms should go away on their own within 10 days. You may be given medicines to relieve symptoms. They include: ? Medicines that shrink swollen nasal passages (topical intranasal decongestants). ? Medicines that treat allergies (antihistamines). ? A spray that eases inflammation of the nostrils (topical intranasal corticosteroids). ? Rinses that help get rid of thick mucus in your nose (nasal saline washes).  If caused by bacteria, your health care provider may recommend waiting to see if your symptoms improve. Most bacterial infections will get better without antibiotic medicine. You may be given antibiotics if you have: ? A severe infection. ? A weak immune system.  If caused by narrow nasal passages or nasal polyps, you may need to have surgery. Follow these instructions at home: Medicines  Take, use, or apply over-the-counter and prescription medicines only as told by your health care provider. These may include nasal sprays.  If you were prescribed an antibiotic medicine, take it as told by your health care provider. Do not stop taking the antibiotic even if you start   to feel better. Hydrate and humidify   Drink enough fluid to keep your urine pale yellow. Staying hydrated will help to thin your mucus.  Use a cool mist humidifier to keep the humidity level in your home above 50%.  Inhale steam for 10-15 minutes, 3-4 times a day, or as told by your health care provider. You can do this in the bathroom while a hot shower is  running.  Limit your exposure to cool or dry air. Rest  Rest as much as possible.  Sleep with your head raised (elevated).  Make sure you get enough sleep each night. General instructions   Apply a warm, moist washcloth to your face 3-4 times a day or as told by your health care provider. This will help with discomfort.  Wash your hands often with soap and water to reduce your exposure to germs. If soap and water are not available, use hand sanitizer.  Do not smoke. Avoid being around people who are smoking (secondhand smoke).  Keep all follow-up visits as told by your health care provider. This is important. Contact a health care provider if:  You have a fever.  Your symptoms get worse.  Your symptoms do not improve within 10 days. Get help right away if:  You have a severe headache.  You have persistent vomiting.  You have severe pain or swelling around your face or eyes.  You have vision problems.  You develop confusion.  Your neck is stiff.  You have trouble breathing. Summary  Sinusitis is soreness and inflammation of your sinuses. Sinuses are hollow spaces in the bones around your face.  This condition is caused by nasal tissues that become inflamed or swollen. The swelling traps or blocks the flow of mucus. This allows bacteria, viruses, and fungi to grow, which leads to infection.  If you were prescribed an antibiotic medicine, take it as told by your health care provider. Do not stop taking the antibiotic even if you start to feel better.  Keep all follow-up visits as told by your health care provider. This is important. This information is not intended to replace advice given to you by your health care provider. Make sure you discuss any questions you have with your health care provider. Document Revised: 10/09/2017 Document Reviewed: 10/09/2017 Elsevier Patient Education  2020 [employer]  

## 2019-10-09 NOTE — Progress Notes (Signed)
Nurse Visit: Patient had her last Depo provera last depo shot was 07/26/2019 given left Deltoid. Patient tolerated Depo injection well today. She is to return between 08/09-08/23.

## 2019-10-11 ENCOUNTER — Ambulatory Visit: Payer: Self-pay | Admitting: Physician Assistant

## 2019-10-14 ENCOUNTER — Other Ambulatory Visit: Payer: Self-pay

## 2019-10-14 ENCOUNTER — Ambulatory Visit (INDEPENDENT_AMBULATORY_CARE_PROVIDER_SITE_OTHER): Payer: Managed Care, Other (non HMO) | Admitting: Physician Assistant

## 2019-10-14 VITALS — Temp 97.2°F

## 2019-10-14 DIAGNOSIS — Z3042 Encounter for surveillance of injectable contraceptive: Secondary | ICD-10-CM

## 2019-10-14 MED ORDER — MEDROXYPROGESTERONE ACETATE 150 MG/ML IM SUSP
150.0000 mg | Freq: Once | INTRAMUSCULAR | Status: AC
  Administered 2019-10-14: 150 mg via INTRAMUSCULAR

## 2019-12-19 ENCOUNTER — Encounter: Payer: Self-pay | Admitting: Physician Assistant

## 2019-12-19 MED ORDER — TORSEMIDE 20 MG PO TABS: 40.0000 mg | ORAL_TABLET | Freq: Every day | ORAL | 0 refills | Status: DC

## 2019-12-26 ENCOUNTER — Telehealth: Payer: Self-pay | Admitting: Physical Medicine and Rehabilitation

## 2019-12-26 NOTE — Telephone Encounter (Signed)
Scheduled for new patient OV. 

## 2019-12-26 NOTE — Telephone Encounter (Signed)
Patient called needing an appointment with Dr Alvester Morin for her back. The number to contact patient is [phone]

## 2020-01-02 NOTE — Progress Notes (Deleted)
     Established patient visit   Patient: [name]   DOB: 1985-05-25   34 y.o. Female  MRN: [id number] Visit Date: 01/03/2020  Today's healthcare provider: Margaretann Loveless, [name]   No chief complaint on file.  Subjective    HPI  ***  {Show patient history (optional):23778::" "}   Medications: Outpatient Medications Prior to Visit  Medication Sig  . albuterol (VENTOLIN HFA) 108 (90 Base) MCG/ACT inhaler Inhale 2 puffs into the lungs every 6 (six) hours as needed for wheezing or shortness of breath.  . doxycycline (VIBRA-TABS) 100 MG tablet Take 1 tablet (100 mg total) by mouth 2 (two) times daily.  Marland Kitchen FLUoxetine (PROZAC) 20 MG tablet Take 1 tablet (20 mg total) by mouth daily.  . fluticasone furoate-vilanterol (BREO ELLIPTA) 100-25 MCG/INH AEPB Inhale 1 puff into the lungs daily.  . Insulin Pen Needle (BD PEN NEEDLE NANO 2ND GEN) 32G X 4 MM MISC To use daily with saxenda injection  . Liraglutide -Weight Management (SAXENDA) 18 MG/3ML SOPN Inject 0.1 mLs (0.6 mg total) into the skin daily. Increase by 0.6mg  weekly until 3mg  daily achieved.  . metroNIDAZOLE (METROGEL) 0.75 % vaginal gel Place 1 Applicatorful vaginally 2 (two) times daily.  . mometasone (ELOCON) 0.1 % cream Apply 1 application topically daily.  . montelukast (SINGULAIR) 10 MG tablet TAKE 1 TABLET BY MOUTH AT  BEDTIME  . nystatin cream (MYCOSTATIN) Apply 1 application topically 2 (two) times daily.  omeprazole (PRILOSEC) 40 MG capsule Take 1 capsule (40 mg total) daily by mouth.  . ondansetron (ZOFRAN) 4 MG tablet Take 1 tablet (4 mg total) by mouth every 8 (eight) hours as needed.  . potassium chloride (K-DUR) 10 MEQ tablet Take 1 tablet (10 mEq total) by mouth daily. (Patient not taking: Reported on 10/04/2019)  . propranolol (INDERAL) 20 MG tablet Take 1 tablet (20 mg total) by mouth 3 (three) times daily as needed. For palpitations  . torsemide (DEMADEX) 20 MG tablet Take 2 tablets (40 mg total) by mouth  daily.  . Vitamin D, Ergocalciferol, (DRISDOL) 50000 units CAPS capsule Take 1 capsule (50,000 Units total) by mouth every 7 (seven) days.   No facility-administered medications prior to visit.    Review of Systems  {Heme  Chem  Endocrine  Serology  Results Review (optional):23779::" "}  Objective    There were no vitals taken for this visit. {Show previous vital signs (optional):23777::" "}  Physical Exam  ***  No results found for any visits on 01/03/20.  Assessment & Plan     ***  No follow-ups on file.      {provider attestation***:1}   01/05/20  [facility] [phone] (phone) [phone] (fax)  [facility]

## 2020-01-03 ENCOUNTER — Ambulatory Visit: Payer: Self-pay | Admitting: Physician Assistant

## 2020-01-06 ENCOUNTER — Ambulatory Visit: Payer: Self-pay | Admitting: Physician Assistant

## 2020-01-09 ENCOUNTER — Ambulatory Visit: Payer: Self-pay | Admitting: Physician Assistant

## 2020-01-14 ENCOUNTER — Ambulatory Visit: Payer: Managed Care, Other (non HMO) | Admitting: Physical Medicine and Rehabilitation

## 2020-01-14 ENCOUNTER — Ambulatory Visit (INDEPENDENT_AMBULATORY_CARE_PROVIDER_SITE_OTHER): Payer: Managed Care, Other (non HMO)

## 2020-01-14 ENCOUNTER — Encounter: Payer: Self-pay | Admitting: Physical Medicine and Rehabilitation

## 2020-01-14 ENCOUNTER — Other Ambulatory Visit: Payer: Self-pay

## 2020-01-14 DIAGNOSIS — G8929 Other chronic pain: Secondary | ICD-10-CM | POA: Diagnosis not present

## 2020-01-14 DIAGNOSIS — M47816 Spondylosis without myelopathy or radiculopathy, lumbar region: Secondary | ICD-10-CM | POA: Diagnosis not present

## 2020-01-14 DIAGNOSIS — M797 Fibromyalgia: Secondary | ICD-10-CM | POA: Diagnosis not present

## 2020-01-14 DIAGNOSIS — M545 Low back pain, unspecified: Secondary | ICD-10-CM

## 2020-01-14 NOTE — Progress Notes (Signed)
Pain in middle of lower back, sometimes goes up slightly into middle back. Pain when trying to walk and exercise. Pain with standing to wash dishes. Has had back pain for a long time, but pain has gotten worse over the last year. Has tried PT, chiropractic, and pain management (this was a few years ago and patient does not remember who she saw- no injections). Had NCV/ EMG which she states showed 'widespread nerve damage." Dr. Regino Schultze 2014 Numeric Pain Rating Scale and Functional Assessment Average Pain 8 Pain Right Now 9 My pain is constant, sharp, dull and aching Pain is worse with: walking, sitting and standing Pain improves with: nothing   In the last MONTH (on 0-10 scale) has pain interfered with the following?  1. General activity like being  able to carry out your everyday physical activities such as walking, climbing stairs, carrying groceries, or moving a chair?  Rating(10)  2. Relation with others like being able to carry out your usual social activities and roles such as  activities at home, at work and in your community. Rating(10)  3. Enjoyment of life such that you have  been bothered by emotional problems such as feeling anxious, depressed or irritable?  Rating(8)

## 2020-01-14 NOTE — Progress Notes (Signed)
Established patient visit   Patient: [name]   DOB: December 19, 1985   34 y.o. Female  MRN: [id number] Visit Date: 01/16/2020  Today's healthcare provider: Margaretann Loveless, [name]   Chief Complaint  Patient presents with  . Follow-up   Subjective    HPI  Patient coming in for follow up weight loss counseling. She is currently doing [place] . Reports that we she tries to go up the does she gets very nauseated. She has also noticed that she is getting nauseated with the Torsemide. Reports that she is trying her best to eat healthier. She is not exercising due to her back pain. Recently was seen by ortho. Reports that she has an MRI scheduled September 18th.  Wt Readings from Last 3 Encounters:  01/16/20 (!) 324 lb (147 kg)  07/26/19 294 lb (133.4 kg)  05/02/19 287 lb 3.2 oz (130.3 kg)    Patient Active Problem List   Diagnosis Date Noted  . Chronic diastolic CHF (congestive heart failure) (HCC) 08/08/2018  . Depression, major, single episode, mild (HCC) 08/08/2018  . Asthma exacerbation 12/28/2017  . Benign essential HTN 05/04/2016  . History of pre-eclampsia 02/12/2015  . Abnormal vaginal Pap smear 02/05/2015  . Abortion, spontaneous 02/05/2015  . Anxiety 02/05/2015  . LBP (low back pain) 02/05/2015  . Fatigue 02/05/2015  . Fibrositis 02/05/2015  . Folliculitis 02/05/2015  . Acid reflux 02/05/2015  . History of diabetes mellitus arising in pregnancy 02/05/2015  . High risk sexual behavior 02/05/2015  . 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/2015  . Class 3 severe obesity due to excess calories with serious comorbidity and body mass index (BMI) of 60.0 to 69.9 in adult (HCC) 02/05/2015  . Obstructive sleep apnea 02/05/2015  . Vitamin D deficiency 02/05/2015  . Depression 02/05/2015  . Fibromyalgia 02/05/2015  . Abnormal C-reactive protein 10/24/2012  . Chronic low back pain 08/13/2012  . Absence of menstruation 09/22/2009  . Migraine without status migrainosus 07/22/2008    Past Medical History:  Diagnosis Date  . Anxiety   . Depression   . Elevated blood pressure   . GERD (gastroesophageal reflux disease)   . Sleep apnea        Medications: Outpatient Medications Prior to Visit  Medication Sig  . albuterol (VENTOLIN HFA) 108 (90 Base) MCG/ACT inhaler Inhale 2 puffs into the lungs every 6 (six) hours as needed for wheezing or shortness of breath.  . Insulin Pen Needle (BD PEN NEEDLE NANO 2ND GEN) 32G X 4 MM MISC To use daily with saxenda injection  . Liraglutide -Weight Management (SAXENDA) 18 MG/3ML SOPN Inject 0.1 mLs (0.6 mg total) into the skin daily. Increase by 0.6mg  weekly until 3mg  daily achieved.  . mometasone (ELOCON) 0.1 % cream Apply 1 application topically daily.  . montelukast (SINGULAIR) 10 MG tablet TAKE 1 TABLET BY MOUTH AT  BEDTIME  . nystatin cream (MYCOSTATIN) Apply 1 application topically 2 (two) times daily.  omeprazole (PRILOSEC) 40 MG capsule Take 1 capsule (40 mg total) daily by mouth.  . ondansetron (ZOFRAN) 4 MG tablet Take 1 tablet (4 mg total) by mouth every 8 (eight) hours as needed.  . propranolol (INDERAL) 20 MG tablet Take 1 tablet (20 mg total) by mouth 3 (three) times daily as needed. For palpitations  . torsemide (DEMADEX) 20 MG tablet Take 2 tablets (40 mg total) by mouth daily.  Marland Kitchen doxycycline (VIBRA-TABS) 100 MG tablet Take 1 tablet (100 mg total) by mouth 2 (two) times daily.  Marland Kitchen  FLUoxetine (PROZAC) 20 MG tablet Take 1 tablet (20 mg total) by mouth daily. (Patient not taking: Reported on 01/16/2020)  . fluticasone furoate-vilanterol (BREO ELLIPTA) 100-25 MCG/INH AEPB Inhale 1 puff into the lungs daily.  . metroNIDAZOLE (METROGEL) 0.75 % vaginal gel Place 1 Applicatorful vaginally 2 (two) times daily. (Patient not taking: Reported on 01/16/2020)  . potassium chloride (K-DUR) 10 MEQ tablet Take 1 tablet (10 mEq total) by mouth daily. (Patient not taking: Reported on 10/04/2019)  . Vitamin D, Ergocalciferol, (DRISDOL)  50000 units CAPS capsule Take 1 capsule (50,000 Units total) by mouth every 7 (seven) days. (Patient not taking: Reported on 01/16/2020)   No facility-administered medications prior to visit.    Review of Systems  Constitutional: Negative.   Respiratory: Negative.   Cardiovascular: Negative.   Gastrointestinal: Positive for nausea.  Neurological: Negative.     Last CBC Lab Results  Component Value Date   WBC 6.2 07/24/2017   HGB 12.5 07/24/2017   HCT 37.5 07/24/2017   MCV 93 07/24/2017   MCH 30.9 07/24/2017   RDW 13.6 07/24/2017   PLT 275 07/24/2017   Last metabolic panel Lab Results  Component Value Date   GLUCOSE 76 07/24/2017   NA 140 07/24/2017   K 4.1 07/24/2017   CL 103 07/24/2017   CO2 20 07/24/2017   BUN 12 07/24/2017   CREATININE 0.76 07/24/2017   GFRNONAA 104 07/24/2017   GFRAA 120 07/24/2017   CALCIUM 9.2 07/24/2017   PROT 7.4 07/24/2017   ALBUMIN 4.3 07/24/2017   LABGLOB 3.1 07/24/2017   AGRATIO 1.4 07/24/2017   BILITOT 0.3 07/24/2017   ALKPHOS 96 07/24/2017   AST 16 07/24/2017   ALT 15 07/24/2017   ANIONGAP 10 12/15/2016      Objective    BP 126/69 (BP Location: Left Wrist, Patient Position: Sitting, Cuff Size: Normal)   Pulse (!) 103   Temp 98.7 F (37.1 C) (Oral)   Resp 16   Wt (!) 324 lb (147 kg)   BMI 61.22 kg/m  BP Readings from Last 3 Encounters:  01/16/20 126/69  07/26/19 122/80  05/02/19 (!) 137/101   Wt Readings from Last 3 Encounters:  01/16/20 (!) 324 lb (147 kg)  07/26/19 294 lb (133.4 kg)  05/02/19 287 lb 3.2 oz (130.3 kg)      Physical Exam Vitals reviewed.  Constitutional:      General: She is not in acute distress.    Appearance: Normal appearance. She is well-developed and well-groomed. She is morbidly obese. She is not diaphoretic.  HENT:     Head: Normocephalic and atraumatic.  Cardiovascular:     Rate and Rhythm: Normal rate and regular rhythm.     Heart sounds: Normal heart sounds. No murmur heard.  No  friction rub. No gallop.   Pulmonary:     Effort: Pulmonary effort is normal. No respiratory distress.     Breath sounds: Normal breath sounds. No wheezing or rales.  Musculoskeletal:     Cervical back: Normal range of motion and neck supple.  Neurological:     Mental Status: She is alert.  Psychiatric:        Behavior: Behavior is cooperative.       Results for orders placed or performed in visit on 01/16/20  POCT urine pregnancy  Result Value Ref Range   Preg Test, Ur Negative Negative    Assessment & Plan     1. Encounter for surveillance of injectable contraceptive Depo-provera given today without issue.  Return in 3 months.  - medroxyPROGESTERone (DEPO-PROVERA) injection 150 mg  2. Class 3 severe obesity due to excess calories with serious comorbidity and body mass index (BMI) of 60.0 to 69.9 in adult [facility]) Has gained 30 pounds since last visit. Unable to tolerate Saxenda or titrate up the dose past the 0.6mg  at this time due to nausea. She is going to try to use Saxenda at bedtime instead to see if this helps her tolerate any better. If not, she will send mychart message and we will change to Contrave instead. She agrees.  - CBC w/Diff/Platelet - Comprehensive Metabolic Panel (CMET) - TSH  3. Chronic diastolic CHF (congestive heart failure) (HCC) Appears currently stable. Having some nausea with torsemide but had not previously. Patient has not been taking potassium with torsemide so will check labs as below. Nausea may also be from [place] still and not torsemide. I will f/u pending lab results. Call if nausea continues.   4. Vitamin D deficiency H/O this and not on supplement at this time. Will check labs as below and f/u pending results. - Vitamin D (25 hydroxy)  5. Chronic midline low back pain without sciatica Worsening and limiting exercise. Will start meloxicam for prn use. Discussed using salonpas lidocaine patches as well. Exercises and stretches printed on AVS.  Followed by Orthopedics now and has MRI scheduled for 02/08/20. Call if worsening in meantime.  - meloxicam (MOBIC) 15 MG tablet; Take 1 tablet (15 mg total) by mouth daily as needed for pain.  Dispense: 30 tablet; Refill: 0   No follow-ups on file.      Delmer Islam, [name], have reviewed all documentation for this visit. The documentation on 01/16/20 for the exam, diagnosis, procedures, and orders are all accurate and complete.   Reine Just  [facility] [phone] (phone) [phone] (fax)  [facility]

## 2020-01-15 ENCOUNTER — Telehealth: Payer: Self-pay | Admitting: Physical Medicine and Rehabilitation

## 2020-01-15 NOTE — Telephone Encounter (Signed)
Pt called stating she got scheduled for an MRI on 02/08/20 and would like to schedule the Review appt  [phone]

## 2020-01-15 NOTE — Progress Notes (Signed)
[name] - 34 y.o. female MRN [id number]  Date of birth: 12/03/1985  Office Visit Note: Visit Date: 01/14/2020 PCP: Margaretann Loveless, [name] Referred by: Suella Grove*  Subjective: Chief Complaint  Patient presents with  . Lower Back - Pain   HPI: [name] is a 34 y.o. female who comes in today Patient comes in as a self-referral today but does follow with [name], [name] as her primary care provider.  She comes in with worsening axial low back pain with some referral up into the middle the back but also some symptoms down into the hips.  She reports 8 out of 10 pain on average but is actually hurting more than that today.  Her pain is constant sharp and aching across the lower back mainly.  Worse with walking and standing.  She has to walk at the grocery store with a grocery cart and even that will bother her.  She reports not being able to walk a very long distance at all.  She denies any red flag complaints of bowel or bladder changes or fevers chills or night sweats or focal weakness.  She has had no prior lumbar surgery.  She reports a history of many medications over the last several years including hydrocodone, muscle relaxer, Tylenol, topical treatments including IcyHot and Tiger balm and blue emu.  She initially had chiropractic manipulation over the years by Dr. Carney Corners main with mild relief.  She tries to continue to stretch that she tries to exercise but she feels like she cannot walk very far because of the pain involved.  She reports the history of back pain extends probably past 16 years now.  Worsening over the last year.  She now works with [employer] as a Occupational psychologist and has a hard time going from sit to stand with the job.  Her history of medical evaluation and management appears that at least at some point she saw Dr. Claria Dice at [facility] orthopedics.  Unfortunately I do not have his notes.  She reports may be some type of injection.   She then went on to be seen in a chronic pain management clinic which was the [facility] clinic and Dr. Adah Perl.  She reports a nerve test showing "widespread nerve damage."  At least on one point I can help today as we had a discussion about that particular clinic having a recent ruling against them for performing fraudulent electrodiagnostic studies on patients entering the clinic.  In all likelihood she does not have any nerve damage based on the fraudulent findings of that clinic.  If that were ever a concern we would have her seen by a qualified neurologist for evaluation.  She then went on to see at the referral of her primary provider, Dr. Hazeline Junker at [facility] clinic.  I do have his notes to review and he regroup with physical therapy and talked about management of back pain wanted to see the patient back in 6 months to actually see he may be about doing an MRI.  She has not followed up with him.  That was in January of this year.  Her case is complicated by morbid obesity.  Her primary provider is trying to help her with weight loss.  She was taking continue remain but this is been changed to liraglutide or Saxenda.  She reports difficulty losing weight because of the pain level she has with exercising.  We briefly touched on diet as probably  the main factor in weight loss and not the exercise.  Unfortunately she is also even at an early age been diagnosed with diastolic congestive heart failure.  Another further issue in terms of her pain management is diagnosis by a rheumatologist of fibromyalgia.      Review of Systems  Musculoskeletal: Positive for back pain and joint pain.  All other systems reviewed and are negative.  Otherwise per HPI.  Assessment & Plan: Visit Diagnoses:  1. 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 Spondylosis without myelopathy or radiculopathy, lumbar region   3. Morbidly obese (HCC)   4. Fibromyalgia     Plan: Findings:  Complicated chronic  16-year history of back pain with history of fibromyalgia as well as osteoarthritis of the lower spine in the setting of significant morbid obesity.  She has had multiple evaluations and multiple attempts at physical therapy and chiropractic care.  She has had really all manner of conservative medications and hydrocodone at a pain clinic.  She has not really want to go down the road of opioid treatment.  At this point she needs an MRI of the lumbar spine to see if there is any targeting type of injection or treatment we could provide to at least break the cycle so that she could exercise and continue to lose weight which I think is going to be beneficial for her.  This would also rule out any other issues that may need to be addressed with a more intensive treatment.  We did talk about fibromyalgia and exercise and sleeping.  I did order the MRI today.  From a differential diagnosis standpoint she has symptoms consistent with facet arthropathy but also potential for stenosis.  I did review several pages of notes from her primary provider as well as Dr. Chaney Malling and reviews of imaging.  We did obtain lumbar x-ray films today.  We discussed over 45 minutes about her pain condition and comorbidities and prior history of treatment.    Meds & Orders: No orders of the defined types were placed in this encounter.   Orders Placed This Encounter  Procedures  . XR Lumbar Spine Complete  . MR LUMBAR SPINE WO CONTRAST    Follow-up: Return for MRI review after completion.   Procedures: No procedures performed  No notes on file   Clinical History: No specialty comments available.   She reports that she has been smoking cigars. She has never used smokeless tobacco. No results for input(s): HGBA1C, LABURIC in the last 8760 hours.  Objective:  VS:  HT:    WT:   BMI:     BP:   HR: bpm  TEMP: ( )  RESP:  Physical Exam Vitals and nursing note reviewed.  Constitutional:      General: She is not in acute  distress.    Appearance: Normal appearance. She is well-developed. She is obese. She is not ill-appearing.  HENT:     Head: Normocephalic and atraumatic.  Eyes:     Conjunctiva/sclera: Conjunctivae normal.     Pupils: Pupils are equal, round, and reactive to light.  Cardiovascular:     Rate and Rhythm: Normal rate.     Pulses: Normal pulses.  Pulmonary:     Effort: Pulmonary effort is normal.  Musculoskeletal:     Right lower leg: No edema.     Left lower leg: No edema.     Comments: Patient slow to rise from a sitting position to standing.  She  does have pain with facet loading and extension.  She has some tender points but also some pain over the greater trochanters.  No pain with hip rotation she has good distal strength.  Skin:    General: Skin is warm and dry.     Findings: No erythema or rash.  Neurological:     General: No focal deficit present.     Mental Status: She is alert and oriented to person, place, and time.     Sensory: No sensory deficit.     Motor: No abnormal muscle tone.     Coordination: Coordination normal.     Gait: Gait normal.  Psychiatric:        Mood and Affect: Mood normal.        Behavior: Behavior normal.     Ortho Exam  Imaging: 4 view lumbar spine shows normal AP and lateral: Normal AP and lateral alignment with early degenerative disc height loss at L5-S1 and facet arthropathy at L4-5 and L5-S1.  No pars defects noted.  No other fractures or dislocations noted.  Images slightly difficult to interpret body habitus.   Past Medical/Family/Surgical/Social History: Medications & Allergies reviewed per EMR, new medications updated. Patient Active Problem List   Diagnosis Date Noted  . Chronic diastolic CHF (congestive heart failure) (HCC) 08/08/2018  . Depression, major, single episode, mild (HCC) 08/08/2018  . Asthma exacerbation 12/28/2017  . Benign essential HTN 05/04/2016  . History of pre-eclampsia 02/12/2015  . Abnormal vaginal Pap smear  02/05/2015  . Abortion, spontaneous 02/05/2015  . Anxiety 02/05/2015  . LBP (low back pain) 02/05/2015  . BMI 50.0-59.9, adult (HCC) 02/05/2015  . Fatigue 02/05/2015  . Fibrositis 02/05/2015  . Folliculitis 02/05/2015  . Acid reflux 02/05/2015  . History of diabetes mellitus arising in pregnancy 02/05/2015  . High risk sexual behavior 02/05/2015  . 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/2015  . Morbidly obese (HCC) 02/05/2015  . Obstructive sleep apnea 02/05/2015  . Vitamin D deficiency 02/05/2015  . Depression 02/05/2015  . Fibromyalgia 02/05/2015  . Abnormal C-reactive protein 10/24/2012  . Chronic LBP 08/13/2012  . Absence of menstruation 09/22/2009  . Migraine without status migrainosus 07/22/2008   Past Medical History:  Diagnosis Date  . Anxiety   . Depression   . Elevated blood pressure   . GERD (gastroesophageal reflux disease)   . Sleep apnea    Family History  Problem Relation Age of Onset  . Hypertension Maternal Grandmother    Past Surgical History:  Procedure Laterality Date  . CESAREAN SECTION     at 28 weeks for pre-eclampsia, pulmonary edema, gestational DM.  Marland Kitchen dilationand curettage of Uterus  2007   SAB  . Headache:2009     Headache wellness consult. during pregnancy   Social History   Occupational History  . Not on file  Tobacco Use  . Smoking status: Current Some Day Smoker    Types: Cigars  . Smokeless tobacco: Never Used  . Tobacco comment: smokes cigars  Vaping Use  . Vaping Use: Never used  Substance and Sexual Activity  . Alcohol use: Yes    Alcohol/week: 0.0 standard drinks    Comment: ocassional alcohol use; Mixed drinks once every 2-3 months  . Drug use: No  . Sexual activity: Yes

## 2020-01-16 ENCOUNTER — Ambulatory Visit: Payer: Managed Care, Other (non HMO) | Admitting: Physician Assistant

## 2020-01-16 ENCOUNTER — Encounter: Payer: Self-pay | Admitting: Physician Assistant

## 2020-01-16 ENCOUNTER — Encounter: Payer: Self-pay | Admitting: Physical Medicine and Rehabilitation

## 2020-01-16 ENCOUNTER — Other Ambulatory Visit: Payer: Self-pay

## 2020-01-16 VITALS — BP 126/69 | HR 103 | Temp 98.7°F | Resp 16 | Wt 324.0 lb

## 2020-01-16 DIAGNOSIS — M545 Low back pain, unspecified: Secondary | ICD-10-CM

## 2020-01-16 DIAGNOSIS — E559 Vitamin D deficiency, unspecified: Secondary | ICD-10-CM

## 2020-01-16 DIAGNOSIS — Z6841 Body Mass Index (BMI) 40.0 and over, adult: Secondary | ICD-10-CM

## 2020-01-16 DIAGNOSIS — Z3042 Encounter for surveillance of injectable contraceptive: Secondary | ICD-10-CM | POA: Diagnosis not present

## 2020-01-16 DIAGNOSIS — G8929 Other chronic pain: Secondary | ICD-10-CM

## 2020-01-16 DIAGNOSIS — I5032 Chronic diastolic (congestive) heart failure: Secondary | ICD-10-CM

## 2020-01-16 LAB — POCT URINE PREGNANCY: Preg Test, Ur: NEGATIVE

## 2020-01-16 MED ORDER — MEDROXYPROGESTERONE ACETATE 150 MG/ML IM SUSP
150.0000 mg | Freq: Once | INTRAMUSCULAR | Status: AC
  Administered 2020-01-16: 150 mg via INTRAMUSCULAR

## 2020-01-16 MED ORDER — MELOXICAM 15 MG PO TABS: 15.0000 mg | ORAL_TABLET | Freq: Every day | ORAL | 0 refills | Status: DC | PRN

## 2020-01-16 NOTE — Telephone Encounter (Signed)
Scheduled for MRI review. 

## 2020-01-16 NOTE — Patient Instructions (Signed)
Salonpas lidocaine patches for back pain  Back Exercises The following exercises strengthen the muscles that help to support the trunk and back. They also help to keep the lower back flexible. Doing these exercises can help to prevent back pain or lessen existing pain.  If you have back pain or discomfort, try doing these exercises 2-3 times each day or as told by your health care provider.  As your pain improves, do them once each day, but increase the number of times that you repeat the steps for each exercise (do more repetitions).  To prevent the recurrence of back pain, continue to do these exercises once each day or as told by your health care provider. Do exercises exactly as told by your health care provider and adjust them as directed. It is normal to feel mild stretching, pulling, tightness, or discomfort as you do these exercises, but you should stop right away if you feel sudden pain or your pain gets worse. Exercises Single knee to chest Repeat these steps 3-5 times for each leg: 1. Lie on your back on a firm bed or the floor with your legs extended. 2. Bring one knee to your chest. Your other leg should stay extended and in contact with the floor. 3. Hold your knee in place by grabbing your knee or thigh with both hands and hold. 4. Pull on your knee until you feel a gentle stretch in your lower back or buttocks. 5. Hold the stretch for 10-30 seconds. 6. Slowly release and straighten your leg. Pelvic tilt Repeat these steps 5-10 times: 1. Lie on your back on a firm bed or the floor with your legs extended. 2. Bend your knees so they are pointing toward the ceiling and your feet are flat on the floor. 3. Tighten your lower abdominal muscles to press your lower back against the floor. This motion will tilt your pelvis so your tailbone points up toward the ceiling instead of pointing to your feet or the floor. 4. With gentle tension and even breathing, hold this position for 5-10  seconds. Cat-cow Repeat these steps until your lower back becomes more flexible: 1. Get into a hands-and-knees position on a firm surface. Keep your hands under your shoulders, and keep your knees under your hips. You may place padding under your knees for comfort. 2. Let your head hang down toward your chest. Contract your abdominal muscles and point your tailbone toward the floor so your lower back becomes rounded like the back of a cat. 3. Hold this position for 5 seconds. 4. Slowly lift your head, let your abdominal muscles relax and point your tailbone up toward the ceiling so your back forms a sagging arch like the back of a cow. 5. Hold this position for 5 seconds.  Press-ups Repeat these steps 5-10 times: 1. Lie on your abdomen (face-down) on the floor. 2. Place your palms near your head, about shoulder-width apart. 3. Keeping your back as relaxed as possible and keeping your hips on the floor, slowly straighten your arms to raise the top half of your body and lift your shoulders. Do not use your back muscles to raise your upper torso. You may adjust the placement of your hands to make yourself more comfortable. 4. Hold this position for 5 seconds while you keep your back relaxed. 5. Slowly return to lying flat on the floor.  Bridges Repeat these steps 10 times: 1. Lie on your back on a firm surface. 2. Bend your knees so  they are pointing toward the ceiling and your feet are flat on the floor. Your arms should be flat at your sides, next to your body. 3. Tighten your buttocks muscles and lift your buttocks off the floor until your waist is at almost the same height as your knees. You should feel the muscles working in your buttocks and the back of your thighs. If you do not feel these muscles, slide your feet 1-2 inches farther away from your buttocks. 4. Hold this position for 3-5 seconds. 5. Slowly lower your hips to the starting position, and allow your buttocks muscles to relax  completely. If this exercise is too easy, try doing it with your arms crossed over your chest. Abdominal crunches Repeat these steps 5-10 times: 1. Lie on your back on a firm bed or the floor with your legs extended. 2. Bend your knees so they are pointing toward the ceiling and your feet are flat on the floor. 3. Cross your arms over your chest. 4. Tip your chin slightly toward your chest without bending your neck. 5. Tighten your abdominal muscles and slowly raise your trunk (torso) high enough to lift your shoulder blades a tiny bit off the floor. Avoid raising your torso higher than that because it can put too much stress on your low back and does not help to strengthen your abdominal muscles. 6. Slowly return to your starting position. Back lifts Repeat these steps 5-10 times: 1. Lie on your abdomen (face-down) with your arms at your sides, and rest your forehead on the floor. 2. Tighten the muscles in your legs and your buttocks. 3. Slowly lift your chest off the floor while you keep your hips pressed to the floor. Keep the back of your head in line with the curve in your back. Your eyes should be looking at the floor. 4. Hold this position for 3-5 seconds. 5. Slowly return to your starting position. Contact a health care provider if:  Your back pain or discomfort gets much worse when you do an exercise.  Your worsening back pain or discomfort does not lessen within 2 hours after you exercise. If you have any of these problems, stop doing these exercises right away. Do not do them again unless your health care provider says that you can. Get help right away if:  You develop sudden, severe back pain. If this happens, stop doing the exercises right away. Do not do them again unless your health care provider says that you can. This information is not intended to replace advice given to you by your health care provider. Make sure you discuss any questions you have with your health care  provider. Document Revised: 09/13/2018 Document Reviewed: 02/08/2018 Elsevier Patient Education  [place].

## 2020-01-17 ENCOUNTER — Telehealth: Payer: Self-pay

## 2020-01-17 DIAGNOSIS — E876 Hypokalemia: Secondary | ICD-10-CM

## 2020-01-17 LAB — VITAMIN D 25 HYDROXY (VIT D DEFICIENCY, FRACTURES): Vit D, 25-Hydroxy: 17.4 ng/mL — ABNORMAL LOW (ref 30.0–100.0)

## 2020-01-17 LAB — CBC WITH DIFFERENTIAL/PLATELET
Basophils Absolute: 0 10*3/uL (ref 0.0–0.2)
Basos: 1 %
EOS (ABSOLUTE): 0.1 10*3/uL (ref 0.0–0.4)
Eos: 1 %
Hematocrit: 36.3 % (ref 34.0–46.6)
Hemoglobin: 12.5 g/dL (ref 11.1–15.9)
Immature Grans (Abs): 0 10*3/uL (ref 0.0–0.1)
Immature Granulocytes: 0 %
Lymphocytes Absolute: 2.1 10*3/uL (ref 0.7–3.1)
Lymphs: 32 %
MCH: 31.4 pg (ref 26.6–33.0)
MCHC: 34.4 g/dL (ref 31.5–35.7)
MCV: 91 fL (ref 79–97)
Monocytes Absolute: 0.3 10*3/uL (ref 0.1–0.9)
Monocytes: 5 %
Neutrophils Absolute: 4.1 10*3/uL (ref 1.4–7.0)
Neutrophils: 61 %
Platelets: 282 10*3/uL (ref 150–450)
RBC: 3.98 x10E6/uL (ref 3.77–5.28)
RDW: 13.4 % (ref 11.7–15.4)
WBC: 6.6 10*3/uL (ref 3.4–10.8)

## 2020-01-17 LAB — COMPREHENSIVE METABOLIC PANEL
ALT: 16 IU/L (ref 0–32)
AST: 17 IU/L (ref 0–40)
Albumin/Globulin Ratio: 1.4 (ref 1.2–2.2)
Albumin: 3.8 g/dL (ref 3.8–4.8)
Alkaline Phosphatase: 98 IU/L (ref 48–121)
BUN/Creatinine Ratio: 11 (ref 9–23)
BUN: 10 mg/dL (ref 6–20)
Bilirubin Total: 0.2 mg/dL (ref 0.0–1.2)
CO2: 24 mmol/L (ref 20–29)
Calcium: 9.2 mg/dL (ref 8.7–10.2)
Chloride: 106 mmol/L (ref 96–106)
Creatinine, Ser: 0.88 mg/dL (ref 0.57–1.00)
GFR calc Af Amer: 98 mL/min/{1.73_m2} (ref >59)
GFR calc non Af Amer: 85 mL/min/{1.73_m2} (ref >59)
Globulin, Total: 2.7 g/dL (ref 1.5–4.5)
Glucose: 108 mg/dL — ABNORMAL HIGH (ref 65–99)
Potassium: 4.5 mmol/L (ref 3.5–5.2)
Sodium: 144 mmol/L (ref 134–144)
Total Protein: 6.5 g/dL (ref 6.0–8.5)

## 2020-01-17 LAB — TSH: TSH: 1.42 u[IU]/mL (ref 0.450–4.500)

## 2020-01-17 MED ORDER — POTASSIUM CHLORIDE ER 10 MEQ PO TBCR: 10.0000 meq | EXTENDED_RELEASE_TABLET | Freq: Every day | ORAL | 1 refills | Status: DC

## 2020-01-17 MED ORDER — VITAMIN D (ERGOCALCIFEROL) 1.25 MG (50000 UNIT) PO CAPS: 50000.0000 [IU] | ORAL_CAPSULE | ORAL | 1 refills | Status: DC

## 2020-01-17 NOTE — Addendum Note (Signed)
Addended by: Margaretann Loveless on: 01/17/2020 09:12 AM   Modules accepted: Orders

## 2020-01-17 NOTE — Telephone Encounter (Signed)
-----   Message from Margaretann Loveless, [place] sent at 01/17/2020  9:10 AM EDT ----- Blood count is normal. Kidney and liver function is normal. Sodium, potassium, and calcium are normal. Thyroid is normal. Vit D is low. Recommend to restart the high dose Vit D supplement once weekly. When completed transition over to OTC Vit D 2000 IU daily.

## 2020-01-17 NOTE — Telephone Encounter (Signed)
Patient was advised and states that she will start the medication for her Vit D right away

## 2020-01-20 ENCOUNTER — Telehealth: Payer: Self-pay

## 2020-01-20 NOTE — Telephone Encounter (Signed)
Pt brought in her re-cert for anxiety & depression FMLA forms from [employer]. Pt request the forms be faxed to [facility] once completed and pt noted the forms are due by Friday 01/24/2020. Forms placed in Jenni's box. Please advise. TNP

## 2020-01-22 NOTE — Telephone Encounter (Signed)
Pt wanting CB for completion of Doc at [phone]

## 2020-01-23 NOTE — Telephone Encounter (Signed)
Patient has been advised. KW 

## 2020-01-23 NOTE — Telephone Encounter (Signed)
Forms completed. Will be faxed today

## 2020-02-05 NOTE — Progress Notes (Signed)
MyChart Video Visit    Virtual Visit via Video Note   This visit type was conducted due to national recommendations for restrictions regarding the COVID-19 Pandemic (e.g. social distancing) in an effort to limit this patient's exposure and mitigate transmission in our community. This patient is at least at moderate risk for complications without adequate follow up. This format is felt to be most appropriate for this patient at this time. Physical exam was limited by quality of the video and audio technology used for the visit.   Patient location: Home Provider location: Office   I discussed the limitations of evaluation and management by telemedicine and the availability of in person appointments. The patient expressed understanding and agreed to proceed.  Patient: [name]   DOB: November 13, 1985   34 y.o. Female  MRN: [id number] Visit Date: 02/06/2020  Today's healthcare provider: Trey Sailors, [name]   Chief Complaint  Patient presents with  . Cough  I,[name],acting as a scribe for Trey Sailors, [name].,have documented all relevant documentation on the behalf of Trey Sailors, [name],as directed by  Trey Sailors, [name] while in the presence of Trey Sailors, [name].  Subjective    Cough This is a new problem. The current episode started in the past 7 days. The problem has been unchanged. The cough is productive of sputum. Associated symptoms include chills, nasal congestion, postnasal drip, rhinorrhea, a sore throat and wheezing. Pertinent negatives include no ear pain, fever, headaches or shortness of breath. The symptoms are aggravated by exercise and other. She has tried OTC cough suppressant for the symptoms.  Patient had a COVID test on Monday,02/03/20 and came back negative. Patient reports daughter came home Sunday sneeding, runny nose and sore throat. She woke up the next morning and felt very fatigued, had increased post nasal drip and increased  congestion. She has a cough. She reports being COVID tested which were both negative. Patient is taking alkaseltezer. She is unvaccinated against COVID.     Medications: Outpatient Medications Prior to Visit  Medication Sig  . albuterol (VENTOLIN HFA) 108 (90 Base) MCG/ACT inhaler Inhale 2 puffs into the lungs every 6 (six) hours as needed for wheezing or shortness of breath.  . Insulin Pen Needle (BD PEN NEEDLE NANO 2ND GEN) 32G X 4 MM MISC To use daily with saxenda injection  . Liraglutide -Weight Management (SAXENDA) 18 MG/3ML SOPN Inject 0.1 mLs (0.6 mg total) into the skin daily. Increase by 0.6mg  weekly until 3mg  daily achieved.  . meloxicam (MOBIC) 15 MG tablet Take 1 tablet (15 mg total) by mouth daily as needed for pain.  . mometasone (ELOCON) 0.1 % cream Apply 1 application topically daily.  . montelukast (SINGULAIR) 10 MG tablet TAKE 1 TABLET BY MOUTH AT  BEDTIME  . nystatin cream (MYCOSTATIN) Apply 1 application topically 2 (two) times daily.  omeprazole (PRILOSEC) 40 MG capsule Take 1 capsule (40 mg total) daily by mouth.  . ondansetron (ZOFRAN) 4 MG tablet Take 1 tablet (4 mg total) by mouth every 8 (eight) hours as needed.  . potassium chloride (KLOR-CON) 10 MEQ tablet Take 1 tablet (10 mEq total) by mouth daily.  . propranolol (INDERAL) 20 MG tablet Take 1 tablet (20 mg total) by mouth 3 (three) times daily as needed. For palpitations  . torsemide (DEMADEX) 20 MG tablet Take 2 tablets (40 mg total) by mouth daily.  . Vitamin D, Ergocalciferol, (DRISDOL) 1.25 MG (50000 UNIT) CAPS capsule Take  1 capsule (50,000 Units total) by mouth every 7 (seven) days.  Marland Kitchen FLUoxetine (PROZAC) 20 MG tablet Take 1 tablet (20 mg total) by mouth daily. (Patient not taking: Reported on 01/16/2020)  . metroNIDAZOLE (METROGEL) 0.75 % vaginal gel Place 1 Applicatorful vaginally 2 (two) times daily. (Patient not taking: Reported on 01/16/2020)   No facility-administered medications prior to visit.     Review of Systems  Constitutional: Positive for chills and fatigue. Negative for appetite change and fever.  HENT: Positive for congestion, postnasal drip, rhinorrhea, sinus pressure and sore throat. Negative for ear pain and sinus pain.   Respiratory: Positive for cough and wheezing. Negative for chest tightness and shortness of breath.   Neurological: Positive for weakness. Negative for headaches.      Objective    There were no vitals taken for this visit.   Physical Exam Constitutional:      Appearance: Normal appearance.  Pulmonary:     Effort: Pulmonary effort is normal. No respiratory distress.  Neurological:     Mental Status: She is alert.  Psychiatric:        Mood and Affect: Mood normal.        Behavior: Behavior normal.        Assessment & Plan    1. Cough  - symptoms and exam c/w viral URI  - no evidence of strep pharyngitis, CAP, AOM, bacterial sinusitis, or other bacterial infection - concern for possible COVID19 infection - will send for outpatient testing, she is coming tomorrow to [facility] after 4:00 PM. Suspect she is COVID + and tested too early.  - discussed need to quarantine 10 days from start of symptoms and until fever-free for at least 48 hours - discussed need to quarantine household members - discussed symptomatic management, natural course, and return precautions  - Novel Coronavirus, NAA ([facility]) - benzonatate (TESSALON PERLES) 100 MG capsule; Take 1 capsule (100 mg total) by mouth 3 (three) times daily as needed for up to 7 days.  Dispense: 21 capsule; Refill: 0    No follow-ups on file.     I discussed the assessment and treatment plan with the patient. The patient was provided an opportunity to ask questions and all were answered. The patient agreed with the plan and demonstrated an understanding of the instructions.   The patient was advised to call back or seek an in-person evaluation if the symptoms worsen or if the condition  fails to improve as anticipated.   ITrey Sailors, [name], have reviewed all documentation for this visit. The documentation on 02/06/20 for the exam, diagnosis, procedures, and orders are all accurate and complete.  The entirety of the information documented in the History of Present Illness, Review of Systems and Physical Exam were personally obtained by me. Portions of this information were initially documented by [facility] and reviewed by me for thoroughness and accuracy.    Maryella Shivers [facility] [phone] (phone) [phone] (fax)  [facility]

## 2020-02-06 ENCOUNTER — Telehealth (INDEPENDENT_AMBULATORY_CARE_PROVIDER_SITE_OTHER): Payer: Managed Care, Other (non HMO) | Admitting: Physician Assistant

## 2020-02-06 DIAGNOSIS — R05 Cough: Secondary | ICD-10-CM | POA: Diagnosis not present

## 2020-02-06 DIAGNOSIS — R059 Cough, unspecified: Secondary | ICD-10-CM

## 2020-02-06 MED ORDER — BENZONATATE 100 MG PO CAPS: 100.0000 mg | ORAL_CAPSULE | Freq: Three times a day (TID) | ORAL | 0 refills | Status: AC | PRN

## 2020-02-08 ENCOUNTER — Encounter: Payer: Self-pay | Admitting: Physician Assistant

## 2020-02-08 ENCOUNTER — Other Ambulatory Visit: Payer: Managed Care, Other (non HMO)

## 2020-02-10 ENCOUNTER — Telehealth (INDEPENDENT_AMBULATORY_CARE_PROVIDER_SITE_OTHER): Payer: Managed Care, Other (non HMO) | Admitting: Physician Assistant

## 2020-02-10 ENCOUNTER — Telehealth: Payer: Self-pay | Admitting: Physical Medicine and Rehabilitation

## 2020-02-10 ENCOUNTER — Encounter: Payer: Self-pay | Admitting: Physician Assistant

## 2020-02-10 VITALS — BP 117/83 | Temp 98.2°F

## 2020-02-10 DIAGNOSIS — M545 Low back pain, unspecified: Secondary | ICD-10-CM

## 2020-02-10 DIAGNOSIS — J40 Bronchitis, not specified as acute or chronic: Secondary | ICD-10-CM

## 2020-02-10 DIAGNOSIS — B379 Candidiasis, unspecified: Secondary | ICD-10-CM

## 2020-02-10 DIAGNOSIS — G8929 Other chronic pain: Secondary | ICD-10-CM

## 2020-02-10 DIAGNOSIS — J4541 Moderate persistent asthma with (acute) exacerbation: Secondary | ICD-10-CM | POA: Diagnosis not present

## 2020-02-10 DIAGNOSIS — T3695XA Adverse effect of unspecified systemic antibiotic, initial encounter: Secondary | ICD-10-CM | POA: Diagnosis not present

## 2020-02-10 LAB — NOVEL CORONAVIRUS, NAA: SARS-CoV-2, NAA: NOT DETECTED

## 2020-02-10 MED ORDER — PSEUDOEPH-BROMPHEN-DM 30-2-10 MG/5ML PO SYRP: 5.0000 mL | ORAL_SOLUTION | Freq: Four times a day (QID) | ORAL | 0 refills | Status: DC | PRN

## 2020-02-10 MED ORDER — DOXYCYCLINE HYCLATE 100 MG PO TABS: 100.0000 mg | ORAL_TABLET | Freq: Two times a day (BID) | ORAL | 0 refills | Status: DC

## 2020-02-10 MED ORDER — ALBUTEROL SULFATE HFA 108 (90 BASE) MCG/ACT IN AERS: 2.0000 | INHALATION_SPRAY | Freq: Four times a day (QID) | RESPIRATORY_TRACT | 0 refills | Status: DC | PRN

## 2020-02-10 MED ORDER — FLUCONAZOLE 150 MG PO TABS: 150.0000 mg | ORAL_TABLET | Freq: Once | ORAL | 0 refills | Status: AC

## 2020-02-10 MED ORDER — PREDNISONE 10 MG (21) PO TBPK: ORAL_TABLET | ORAL | 0 refills | Status: DC

## 2020-02-10 NOTE — Patient Instructions (Signed)

## 2020-02-10 NOTE — Progress Notes (Signed)
MyChart Video Visit    Virtual Visit via Video Note   This visit type was conducted due to national recommendations for restrictions regarding the COVID-19 Pandemic (e.g. social distancing) in an effort to limit this patient's exposure and mitigate transmission in our community. This patient is at least at moderate risk for complications without adequate follow up. This format is felt to be most appropriate for this patient at this time. Physical exam was limited by quality of the video and audio technology used for the visit.   Patient location: Home Provider location: [facility]  I discussed the limitations of evaluation and management by telemedicine and the availability of in person appointments. The patient expressed understanding and agreed to proceed.  Patient: [name]   DOB: 1986-05-03   34 y.o. Female  MRN: [id number] Visit Date: 02/10/2020  Today's healthcare provider: Margaretann Loveless, [name]   Chief Complaint  Patient presents with  . Cough   Subjective    Cough This is a new problem. The current episode started in the past 7 days (Sunday 02/02/20 ). The problem has been unchanged. The problem occurs constantly. The cough is productive of sputum. Associated symptoms include headaches, postnasal drip and wheezing. Pertinent negatives include no chest pain, ear pain, fever, rhinorrhea, sore throat or shortness of breath. The symptoms are aggravated by lying down. She has tried prescription cough suppressant (She has had 2 covid test that came back negative.Marland Kitchen) for the symptoms.    Patient Active Problem List   Diagnosis Date Noted  . Chronic diastolic CHF (congestive heart failure) (HCC) 08/08/2018  . Depression, major, single episode, mild (HCC) 08/08/2018  . Asthma exacerbation 12/28/2017  . Benign essential HTN 05/04/2016  . History of pre-eclampsia 02/12/2015  . Abnormal vaginal Pap smear 02/05/2015  . Abortion, spontaneous 02/05/2015  . Anxiety 02/05/2015  .  LBP (low back pain) 02/05/2015  . Fatigue 02/05/2015  . Fibrositis 02/05/2015  . Folliculitis 02/05/2015  . Acid reflux 02/05/2015  . History of diabetes mellitus arising in pregnancy 02/05/2015  . High risk sexual behavior 02/05/2015  . 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/2015  . Class 3 severe obesity due to excess calories with serious comorbidity and body mass index (BMI) of 60.0 to 69.9 in adult (HCC) 02/05/2015  . Obstructive sleep apnea 02/05/2015  . Vitamin D deficiency 02/05/2015  . Depression 02/05/2015  . Fibromyalgia 02/05/2015  . Abnormal C-reactive protein 10/24/2012  . Chronic low back pain 08/13/2012  . Absence of menstruation 09/22/2009  . Migraine without status migrainosus 07/22/2008   Past Medical History:  Diagnosis Date  . Anxiety   . Depression   . Elevated blood pressure   . GERD (gastroesophageal reflux disease)   . Sleep apnea       Medications: Outpatient Medications Prior to Visit  Medication Sig  . albuterol (VENTOLIN HFA) 108 (90 Base) MCG/ACT inhaler Inhale 2 puffs into the lungs every 6 (six) hours as needed for wheezing or shortness of breath.  . benzonatate (TESSALON PERLES) 100 MG capsule Take 1 capsule (100 mg total) by mouth 3 (three) times daily as needed for up to 7 days.  Marland Kitchen FLUoxetine (PROZAC) 20 MG tablet Take 1 tablet (20 mg total) by mouth daily.  . Insulin Pen Needle (BD PEN NEEDLE NANO 2ND GEN) 32G X 4 MM MISC To use daily with saxenda injection  . Liraglutide -Weight Management (SAXENDA) 18 MG/3ML SOPN Inject 0.1 mLs (0.6 mg total) into the skin daily. Increase by 0.6mg  weekly  until 3mg  daily achieved.  . meloxicam (MOBIC) 15 MG tablet Take 1 tablet (15 mg total) by mouth daily as needed for pain.  . montelukast (SINGULAIR) 10 MG tablet TAKE 1 TABLET BY MOUTH AT  BEDTIME  . nystatin cream (MYCOSTATIN) Apply 1 application topically 2 (two) times daily.  omeprazole (PRILOSEC) 40 MG capsule Take 1 capsule (40 mg total) daily by mouth.  .  potassium chloride (KLOR-CON) 10 MEQ tablet Take 1 tablet (10 mEq total) by mouth daily.  . propranolol (INDERAL) 20 MG tablet Take 1 tablet (20 mg total) by mouth 3 (three) times daily as needed. For palpitations  . torsemide (DEMADEX) 20 MG tablet Take 2 tablets (40 mg total) by mouth daily.  . Vitamin D, Ergocalciferol, (DRISDOL) 1.25 MG (50000 UNIT) CAPS capsule Take 1 capsule (50,000 Units total) by mouth every 7 (seven) days.  . metroNIDAZOLE (METROGEL) 0.75 % vaginal gel Place 1 Applicatorful vaginally 2 (two) times daily. (Patient not taking: Reported on 01/16/2020)  . mometasone (ELOCON) 0.1 % cream Apply 1 application topically daily. (Patient not taking: Reported on 02/10/2020)  . ondansetron (ZOFRAN) 4 MG tablet Take 1 tablet (4 mg total) by mouth every 8 (eight) hours as needed. (Patient not taking: Reported on 02/10/2020)   No facility-administered medications prior to visit.    Review of Systems  Constitutional: Positive for fatigue. Negative for fever.  HENT: Positive for congestion and postnasal drip. Negative for ear pain, rhinorrhea, sinus pressure, sinus pain and sore throat.   Respiratory: Positive for cough and wheezing. Negative for chest tightness and shortness of breath.   Cardiovascular: Negative for chest pain.  Neurological: Positive for headaches.    Last CBC Lab Results  Component Value Date   WBC 6.6 01/16/2020   HGB 12.5 01/16/2020   HCT 36.3 01/16/2020   MCV 91 01/16/2020   MCH 31.4 01/16/2020   RDW 13.4 01/16/2020   PLT 282 01/16/2020   Last metabolic panel Lab Results  Component Value Date   GLUCOSE 108 (H) 01/16/2020   NA 144 01/16/2020   K 4.5 01/16/2020   CL 106 01/16/2020   CO2 24 01/16/2020   BUN 10 01/16/2020   CREATININE 0.88 01/16/2020   GFRNONAA 85 01/16/2020   GFRAA 98 01/16/2020   CALCIUM 9.2 01/16/2020   PROT 6.5 01/16/2020   ALBUMIN 3.8 01/16/2020   LABGLOB 2.7 01/16/2020   AGRATIO 1.4 01/16/2020   BILITOT 0.2 01/16/2020    ALKPHOS 98 01/16/2020   AST 17 01/16/2020   ALT 16 01/16/2020   ANIONGAP 10 12/15/2016      Objective    Temp 98.2 F (36.8 C) (Tympanic)   SpO2 98%  BP Readings from Last 3 Encounters:  02/10/20 117/83  01/16/20 126/69  07/26/19 122/80   Wt Readings from Last 3 Encounters:  01/16/20 (!) 324 lb (147 kg)  07/26/19 294 lb (133.4 kg)  05/02/19 287 lb 3.2 oz (130.3 kg)      Physical Exam Vitals reviewed.  Constitutional:      General: She is not in acute distress.    Appearance: Normal appearance. She is well-developed. She is obese. She is not ill-appearing.  HENT:     Head: Normocephalic and atraumatic.  Pulmonary:     Effort: Pulmonary effort is normal. No respiratory distress (dry, barky cough heard; no audible wheezing).  Musculoskeletal:     Cervical back: Normal range of motion and neck supple.  Neurological:     Mental Status: She is alert.  Psychiatric:        Mood and Affect: Mood normal.        Behavior: Behavior normal.        Thought Content: Thought content normal.        Judgment: Judgment normal.       Assessment & Plan     1. Bronchitis Worsening. Continue Albuterol inhaler prn. Higher risk for pneumonia due to asthma and obesity. Will treat with Doxycycline, prednisone and Bromfed DM. Push fluids. Rest. Call if worsening.  - doxycycline (VIBRA-TABS) 100 MG tablet; Take 1 tablet (100 mg total) by mouth 2 (two) times daily.  Dispense: 20 tablet; Refill: 0 - predniSONE (STERAPRED UNI-PAK 21 TAB) 10 MG (21) TBPK tablet; 6 day taper; take as directed on package instructions  Dispense: 21 tablet; Refill: 0 - brompheniramine-pseudoephedrine-DM 30-2-10 MG/5ML syrup; Take 5 mLs by mouth 4 (four) times daily as needed.  Dispense: 120 mL; Refill: 0  2. Antibiotic-induced yeast infection Gets yeast infections with antibiotics. Diflucan given as below.  - fluconazole (DIFLUCAN) 150 MG tablet; Take 1 tablet (150 mg total) by mouth once for 1 dose. May repeat in  48-72 hrs if needed  Dispense: 2 tablet; Refill: 0  3. Moderate persistent asthma with exacerbation Stable. Diagnosis pulled for medication refill. Continue current medical treatment plan. - albuterol (VENTOLIN HFA) 108 (90 Base) MCG/ACT inhaler; Inhale 2 puffs into the lungs every 6 (six) hours as needed for wheezing or shortness of breath.  Dispense: 8 g; Refill: 0   No follow-ups on file.     I discussed the assessment and treatment plan with the patient. The patient was provided an opportunity to ask questions and all were answered. The patient agreed with the plan and demonstrated an understanding of the instructions.   The patient was advised to call back or seek an in-person evaluation if the symptoms worsen or if the condition fails to improve as anticipated.  I provided 14 minutes of non-face-to-face time during this encounter.  Delmer Islam, [name], have reviewed all documentation for this visit. The documentation on 02/10/20 for the exam, diagnosis, procedures, and orders are all accurate and complete.  Reine Just [facility] [phone] (phone) [phone] (fax)  [facility]

## 2020-02-10 NOTE — Telephone Encounter (Signed)
Patient called.   It seems our office and her keep missing each other. Requesting a call to r/s her upcoming appointment.   Call back: [phone]

## 2020-02-11 ENCOUNTER — Ambulatory Visit: Payer: Managed Care, Other (non HMO) | Admitting: Physical Medicine and Rehabilitation

## 2020-02-11 NOTE — Telephone Encounter (Signed)
Called [name] and informed her that Dr. Alvester Morin would like for her to see [name]. [name] agreed and will be waiting for a call from [name] referral.

## 2020-02-11 NOTE — Addendum Note (Signed)
Addended by: Cindie Crumbly B on: 02/11/2020 10:24 AM   Modules accepted: Orders

## 2020-03-11 ENCOUNTER — Other Ambulatory Visit: Payer: Self-pay

## 2020-03-11 ENCOUNTER — Encounter: Payer: Self-pay | Admitting: Physical Therapy

## 2020-03-11 ENCOUNTER — Ambulatory Visit: Payer: Managed Care, Other (non HMO) | Attending: Physical Medicine and Rehabilitation | Admitting: Physical Therapy

## 2020-03-11 DIAGNOSIS — M545 Low back pain, unspecified: Secondary | ICD-10-CM | POA: Diagnosis present

## 2020-03-11 DIAGNOSIS — R293 Abnormal posture: Secondary | ICD-10-CM | POA: Insufficient documentation

## 2020-03-11 DIAGNOSIS — G8929 Other chronic pain: Secondary | ICD-10-CM | POA: Insufficient documentation

## 2020-03-11 DIAGNOSIS — R262 Difficulty in walking, not elsewhere classified: Secondary | ICD-10-CM | POA: Diagnosis present

## 2020-03-12 NOTE — Therapy (Signed)
[facility] [address] [place], [place], [address] Phone: [phone]   Fax:  [phone]  Physical Therapy Evaluation  Patient Details  Name: [name] MRN: [id number] Date of Birth: 1985-11-01 Referring Provider (PT): Tyrell Antonio, MD   Encounter Date: 03/11/2020   PT End of Session - 03/11/20 1848    Visit Number 1    Number of Visits 13    Date for PT Re-Evaluation 05/09/20   incr time for scheduling difficulty   Authorization Type [employer] [place] at visit 6  (vaso not covered)    PT Start Time 1845    PT Stop Time 1924    PT Time Calculation (min) 39 min    Activity Tolerance Patient tolerated treatment well    Behavior During Therapy [facility] for tasks assessed/performed           Past Medical History:  Diagnosis Date  . Anxiety   . Depression   . Elevated blood pressure   . GERD (gastroesophageal reflux disease)   . Sleep apnea     Past Surgical History:  Procedure Laterality Date  . CESAREAN SECTION     at 28 weeks for pre-eclampsia, pulmonary edema, gestational DM.  Marland Kitchen dilationand curettage of Uterus  2007   SAB  . Headache:2009     Headache wellness consult. during pregnancy    There were no vitals filed for this visit.    Subjective Assessment - 03/11/20 1848    Subjective Unsure of what really started it but it has been there for a long time. I think it got worse after my csection- seems to centralize around where I had the epidural. I have done PT and chiropractor before. PT did not really make a difference, chriopractor was doing ok until I was placed on a traction machine and it made it hurt worse. I have tried meds and steroid injection. Injection stopped pain for about 3 days. Pain meds have not helped. Tylenol muscle and back was the closest to helpful but I cannot find it now. I bought a new bed/mattress but it did not make a difference. Night pain I can feel in my hips and shake my legs like I  am trying to loosen something up.    How long can you walk comfortably? about 100 ft.    Patient Stated Goals decrease pain, sense of normalcy, washing dishes    Currently in Pain? Yes    Pain Score 7     Pain Location Back    Pain Orientation Lower;Medial    Pain Descriptors / Indicators Sharp;Sore    Aggravating Factors  standing up straight, walking    Pain Relieving Factors leaning back                          Objective measurements completed on examination: See above findings.               PT Education - 03/11/20 1859    Education Details anatomy of condition, POC, HEP, FOTO, exercise form/rationale    Person(s) Educated Patient    Methods Explanation;Demonstration;Tactile cues;Verbal cues;Handout    Comprehension Verbalized understanding;Returned demonstration;Verbal cues required;Tactile cues required;Need further instruction            PT Short Term Goals - 03/12/20 1350      PT SHORT TERM GOAL #1   Title pt will present standing posture at midline    Baseline shifts to Lt  at eval    Time 3    Period Weeks    Status New    Target Date 04/04/20             PT Long Term Goals - 03/12/20 1347      PT LONG TERM GOAL #1   Title bil hip MMT gross to 5/5 for necessary support    Baseline not appropriate to test at eval due to notable pelvic rotation    Time 8    Period Weeks    Status New    Target Date 05/09/20      PT LONG TERM GOAL #2   Title pt will be able to sleep without limitation by onset of pain    Baseline bil hip pain limits sleep at eval    Time 8    Period Weeks    Status New    Target Date 05/09/20      PT LONG TERM GOAL #3   Title pt will be able to ambulate for at least 30 min comfortably    Baseline limited to approx 127ft at eval    Time 8    Period Weeks    Status New    Target Date 05/09/20      PT LONG TERM GOAL #4   Title pt will be independent with long term HEP in coordination with exercise for  weight loss    Baseline would like to exercise for weight loss but is limited by pain at eval    Time 8    Period Weeks    Status New    Target Date 05/09/20                  Plan - 03/12/20 1340    Clinical Impression Statement Pt presents to PT with complaints of chronic LBP that has unsuccessfully been treated in the past. Notable post rotation of Lt innominate corrected with Hesh self-correction technique. She favors her Lt side which futher encourages rotation. Discussed work Surveyor, quantity and ADL postures to reduce muscle spasm. Pt will benefit from skilled PT to address pain and improve postural support.    Personal Factors and Comorbidities Time since onset of injury/illness/exacerbation;Comorbidity 2    Comorbidities h/o of c-section, past treatments with PT & chiropractic    Examination-Activity Limitations Reach Overhead;Bed Mobility;Sit;Bend;Sleep;Caring for Others;Carry;Squat;Stairs;Stand;Lift;Locomotion Level    Examination-Participation Restrictions Meal Prep;Occupation;Cleaning;Community Activity    Stability/Clinical Decision Making Evolving/Moderate complexity    Clinical Decision Making Moderate    Rehab Potential Good    PT Frequency 2x / week    PT Duration 6 weeks    PT Treatment/Interventions ADLs/Self Care Home Management;Cryotherapy;Electrical Stimulation;Gait training;Ultrasound;Traction;Moist Heat;Iontophoresis 4mg /ml Dexamethasone;Stair training;Functional mobility training;Therapeutic activities;Therapeutic exercise;Neuromuscular re-education;Manual techniques;Patient/family education;Passive range of motion;Dry needling;Taping;Joint Manipulations;Spinal Manipulations    PT Next Visit Plan recheck pelvic rotation, core engagement, reinforce equal posture    PT Home Exercise Plan hesh- self correction of Lt post innom, seated/standing flexion stretch, equal posture    Consulted and Agree with Plan of Care Patient           Patient will benefit  from skilled therapeutic intervention in order to improve the following deficits and impairments:  Difficulty walking, Increased muscle spasms, Decreased activity tolerance, Pain, Improper body mechanics, Decreased strength, Postural dysfunction  Visit Diagnosis: Chronic midline low back pain without sciatica - Plan: PT plan of care cert/re-cert  Abnormal posture - Plan: PT plan of care cert/re-cert  Difficulty in walking, not elsewhere classified -  Plan: PT plan of care cert/re-cert     Problem List Patient Active Problem List   Diagnosis Date Noted  . Chronic diastolic CHF (congestive heart failure) (HCC) 08/08/2018  . Depression, major, single episode, mild (HCC) 08/08/2018  . Asthma exacerbation 12/28/2017  . Benign essential HTN 05/04/2016  . History of pre-eclampsia 02/12/2015  . Abnormal vaginal Pap smear 02/05/2015  . Abortion, spontaneous 02/05/2015  . Anxiety 02/05/2015  . LBP (low back pain) 02/05/2015  . Fatigue 02/05/2015  . Fibrositis 02/05/2015  . Folliculitis 02/05/2015  . Acid reflux 02/05/2015  . History of diabetes mellitus arising in pregnancy 02/05/2015  . High risk sexual behavior 02/05/2015  . 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/2015  . Class 3 severe obesity due to excess calories with serious comorbidity and body mass index (BMI) of 60.0 to 69.9 in adult (HCC) 02/05/2015  . Obstructive sleep apnea 02/05/2015  . Vitamin D deficiency 02/05/2015  . Depression 02/05/2015  . Fibromyalgia 02/05/2015  . Abnormal C-reactive protein 10/24/2012  . Chronic low back pain 08/13/2012  . Absence of menstruation 09/22/2009  . Migraine without status migrainosus 07/22/2008    Kirtis Challis C. Thom Ollinger PT, DPT 03/12/20 1:55 PM   [facility] [address] [place], [place], [address] Phone: [phone]   Fax:  [phone]  Name: [name] MRN: [id number] Date of Birth: 1986-03-02

## 2020-03-19 ENCOUNTER — Ambulatory Visit: Payer: Managed Care, Other (non HMO)

## 2020-03-19 ENCOUNTER — Other Ambulatory Visit: Payer: Self-pay

## 2020-03-19 DIAGNOSIS — G8929 Other chronic pain: Secondary | ICD-10-CM

## 2020-03-19 DIAGNOSIS — R293 Abnormal posture: Secondary | ICD-10-CM

## 2020-03-19 DIAGNOSIS — R262 Difficulty in walking, not elsewhere classified: Secondary | ICD-10-CM

## 2020-03-19 DIAGNOSIS — M545 Low back pain, unspecified: Secondary | ICD-10-CM | POA: Diagnosis not present

## 2020-03-19 NOTE — Therapy (Signed)
[place], [place], [address] Phone: [phone]   Fax:  [phone]  Physical Therapy Treatment  Patient Details  Name: [name] MRN: [id number] Date of Birth: Jun 05, 1985 Referring Provider (PT): Magnus Sinning, MD   Encounter Date: 03/19/2020   PT End of Session - 03/19/20 1044    Visit Number 2    Number of Visits 13    Date for PT Re-Evaluation 05/09/20   incr time for scheduling difficulty   Authorization Type [employer] [place] at visit 6  (vaso not covered)    PT Start Time 1045    PT Stop Time 1123    PT Time Calculation (min) 38 min    Activity Tolerance Patient tolerated treatment well    Behavior During Therapy [facility] for tasks assessed/performed           Past Medical History:  Diagnosis Date  . Anxiety   . Depression   . Elevated blood pressure   . GERD (gastroesophageal reflux disease)   . Sleep apnea     Past Surgical History:  Procedure Laterality Date  . CESAREAN SECTION     at 28 weeks for pre-eclampsia, pulmonary edema, gestational DM.  Marland Kitchen dilationand curettage of Uterus  2007   SAB  . [id number]     Headache wellness consult. during pregnancy    There were no vitals filed for this visit.   Subjective Assessment - 03/19/20 1045    Subjective Pt reports that her pain in low back and L hip has been very aggravated all morning today and continues to wake her up at night, but the pain does not keep her awake for too long.    How long can you walk comfortably? about 100 ft.    Patient Stated Goals decrease pain, sense of normalcy, washing dishes    Currently in Pain? Yes    Pain Score 9     Pain Location Back    Pain Orientation Lower;Medial    Pain Descriptors / Indicators Aching;Sharp;Sore    Pain Radiating Towards L hip              [facility] PT Assessment - 03/19/20 0001      Assessment   Medical Diagnosis chronic LBP    Referring Provider (PT) Magnus Sinning, MD      Special Tests   Other special tests Long sit test: Posterior rotation of R innominate noted this session that was not as apparent following MET (L HS and R quad) x 8                         [facility] Adult PT Treatment/Exercise - 03/19/20 0001      Self-Care   Self-Care Other Self-Care Comments    Other Self-Care Comments  Updated HEP and provided patient with a handout - explained that pt can use the app or website to view videos of interventions; explained MET following long sit test, lower crossed syndrome, core activation      Exercises   Exercises Knee/Hip      Lumbar Exercises: Aerobic   Nustep L3 x 6 min      Lumbar Exercises: Supine   Ab Set 20 reps    AB Set Limitations green phyioball against BLE in hooklying - pt presses into ball with BUE and engages abdominals 20x    Pelvic Tilt 20 reps    Pelvic Tilt Limitations initial cues for form  Bridge 15 reps    Bridge Limitations bridge with B hip ADD against ball      Knee/Hip Exercises: Standing   Hip Abduction Stengthening;Left;20 reps;Knee straight    Hip Extension Stengthening;Left;20 reps;Knee straight      Manual Therapy   Manual Therapy Soft tissue mobilization;Myofascial release;Passive ROM    Manual therapy comments Long sit test: Posterior rotation of R innominate noted this session that was not as apparent following MET (L HS and R quad) x 8    Soft tissue mobilization STM, myofascial release, and IASTM along L glute med, piriformis, and HS    Passive ROM Passive B hip flexor stretch in prone 2 x 30 sec                  PT Education - 03/19/20 1241    Education Details Updated HEP and provided patient with a handout - explained that pt can use the app or website to view videos of interventions; explained MET following long sit test, lower crossed syndrome, core activation    Person(s) Educated Patient    Methods Explanation;Demonstration;Tactile cues;Verbal cues     Comprehension Verbalized understanding;Returned demonstration;Verbal cues required;Tactile cues required            PT Short Term Goals - 03/12/20 1350      PT SHORT TERM GOAL #1   Title pt will present standing posture at midline    Baseline shifts to Lt at eval    Time 3    Period Weeks    Status New    Target Date 04/04/20             PT Long Term Goals - 03/12/20 1347      PT LONG TERM GOAL #1   Title bil hip MMT gross to 5/5 for necessary support    Baseline not appropriate to test at eval due to notable pelvic rotation    Time 8    Period Weeks    Status New    Target Date 05/09/20      PT LONG TERM GOAL #2   Title pt will be able to sleep without limitation by onset of pain    Baseline bil hip pain limits sleep at eval    Time 8    Period Weeks    Status New    Target Date 05/09/20      PT LONG TERM GOAL #3   Title pt will be able to ambulate for at least 30 min comfortably    Baseline limited to approx 161f at eval    Time 8    Period Weeks    Status New    Target Date 05/09/20      PT LONG TERM GOAL #4   Title pt will be independent with long term HEP in coordination with exercise for weight loss    Baseline would like to exercise for weight loss but is limited by pain at eval    Time 8    Period Weeks    Status New    Target Date 05/09/20                 Plan - 03/19/20 1126    Clinical Impression Statement Patient tolerated treatment session with slight relief following interventions and manual therapy. Pt had minimal aggravation of L hip/buttock symptoms during bridges but no significant increase in pain. Pt states "I feel like it's loosened up a little" at end of session.  Personal Factors and Comorbidities Time since onset of injury/illness/exacerbation;Comorbidity 2    Comorbidities h/o of c-section, past treatments with PT & chiropractic    Examination-Activity Limitations Reach Overhead;Bed Mobility;Sit;Bend;Sleep;Caring for  Others;Carry;Squat;Stairs;Stand;Lift;Locomotion Level    Examination-Participation Restrictions Meal Prep;Occupation;Cleaning;Community Activity    Stability/Clinical Decision Making Evolving/Moderate complexity    Rehab Potential Good    PT Frequency 2x / week    PT Duration 6 weeks    PT Treatment/Interventions ADLs/Self Care Home Management;Cryotherapy;Electrical Stimulation;Gait training;Ultrasound;Traction;Moist Heat;Iontophoresis 18m/ml Dexamethasone;Stair training;Functional mobility training;Therapeutic activities;Therapeutic exercise;Neuromuscular re-education;Manual techniques;Patient/family education;Passive range of motion;Dry needling;Taping;Joint Manipulations;Spinal Manipulations    PT Next Visit Plan recheck pelvic rotation, core engagement, reinforce equal posture    PT Home Exercise Plan [id number] - hesh- self correction of Lt post innom, seated/standing flexion stretch, equal posture, standing hip EXT and ABD, posterior pelvic tilts, bridges    Consulted and Agree with Plan of Care Patient           Patient will benefit from skilled therapeutic intervention in order to improve the following deficits and impairments:  Difficulty walking, Increased muscle spasms, Decreased activity tolerance, Pain, Improper body mechanics, Decreased strength, Postural dysfunction  Visit Diagnosis: Chronic midline low back pain without sciatica  Abnormal posture  Difficulty in walking, not elsewhere classified     Problem List Patient Active Problem List   Diagnosis Date Noted  . Chronic diastolic CHF (congestive heart failure) ([place] 08/08/2018  . Depression, major, single episode, mild ([place] [place] 08/08/2018  . Asthma exacerbation 12/28/2017  . Benign essential HTN 05/04/2016  . History of pre-eclampsia 02/12/2015  . Abnormal vaginal Pap smear 02/05/2015  . Abortion, spontaneous 02/05/2015  . Anxiety 02/05/2015  . LBP (low back pain) 02/05/2015  . Fatigue 02/05/2015  . Fibrositis  02/05/2015  . Folliculitis 062/13/0865 . Acid reflux 02/05/2015  . History of diabetes mellitus arising in pregnancy 02/05/2015  . High risk sexual behavior 02/05/2015  . 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/2015  . Class 3 severe obesity due to excess calories with serious comorbidity and body mass index (BMI) of 60.0 to 69.9 in adult ([place] 02/05/2015  . Obstructive sleep apnea 02/05/2015  . Vitamin D deficiency 02/05/2015  . Depression 02/05/2015  . Fibromyalgia 02/05/2015  . Abnormal C-reactive protein 10/24/2012  . Chronic low back pain 08/13/2012  . Absence of menstruation 09/22/2009  . Migraine without status migrainosus 07/22/2008     KHaydee Monica PT, DPT 03/19/20 12:46 PM  [facility] LLC[address]GGrant[place] [address]Phone: [phone]  Fax:  [phone] Name: Melanie LIMINGMRN: [id number] of Birth: [birth year]

## 2020-03-24 ENCOUNTER — Ambulatory Visit: Payer: Managed Care, Other (non HMO)

## 2020-03-26 ENCOUNTER — Other Ambulatory Visit: Payer: Self-pay

## 2020-03-26 ENCOUNTER — Ambulatory Visit: Payer: Managed Care, Other (non HMO) | Attending: Physical Medicine and Rehabilitation

## 2020-03-26 DIAGNOSIS — G8929 Other chronic pain: Secondary | ICD-10-CM | POA: Insufficient documentation

## 2020-03-26 DIAGNOSIS — R262 Difficulty in walking, not elsewhere classified: Secondary | ICD-10-CM | POA: Diagnosis present

## 2020-03-26 DIAGNOSIS — R293 Abnormal posture: Secondary | ICD-10-CM | POA: Diagnosis present

## 2020-03-26 DIAGNOSIS — M545 Low back pain, unspecified: Secondary | ICD-10-CM | POA: Insufficient documentation

## 2020-03-26 NOTE — Therapy (Signed)
[place] [employer], [place], [address] Phone: [phone]   Fax:  [phone]  Physical Therapy Treatment  Patient Details  Name: [name] MRN: [id number] Date of Birth: [birth year] Referring Provider (PT): Magnus Sinning, MD   Encounter Date: 03/26/2020   PT End of Session - 03/26/20 1830    Visit Number 3    Number of Visits 13    Date for PT Re-Evaluation 05/09/20   incr time for scheduling difficulty   Authorization Type [employer] at visit 6  (vaso not covered)    PT Start Time 1830    PT Stop Time 1917    PT Time Calculation (min) 47 min    Activity Tolerance Patient tolerated treatment well    Behavior During Therapy [facility] for tasks assessed/performed           Past Medical History:  Diagnosis Date  . Anxiety   . Depression   . Elevated blood pressure   . GERD (gastroesophageal reflux disease)   . Sleep apnea     Past Surgical History:  Procedure Laterality Date  . CESAREAN SECTION     at 28 weeks for pre-eclampsia, pulmonary edema, gestational DM.  Marland Kitchen dilationand curettage of Uterus  2007   SAB  . [id number]     Headache wellness consult. during pregnancy    There were no vitals filed for this visit.   Subjective Assessment - 03/26/20 1932    Subjective "I was hurting so much Monday night and Tuesday that I could not make it here and called into work. The pain was so bad Monday night going into Tuesday that I was almost in tears. I didn't do anything different or sleep in a different position that I noticed." Pt states her symptoms are still aggravated upon arrival.    How long can you walk comfortably? about 100 ft.    Patient Stated Goals decrease pain, sense of normalcy, washing dishes    Currently in Pain? Yes    Pain Score 8     Pain Location Back    Pain Orientation Lower;Medial    Pain Descriptors / Indicators Aching;Sharp;Sore    Pain Radiating Towards Usually L hip but  more in the R hip today              [facility] PT Assessment - 03/26/20 0001      Assessment   Medical Diagnosis chronic LBP    Referring Provider (PT) Magnus Sinning, MD                         [facility] Adult PT Treatment/Exercise - 03/26/20 0001      Self-Care   Self-Care Other Self-Care Comments    Other Self-Care Comments  Consistency with HEP as tolerated, moist heat at home for pain relief as needed. Discussed innominate rotation with skeleton model as visual demonstration. Pt instructed to bear weight equally through BLE as tolerated.      Lumbar Exercises: Standing   Other Standing Lumbar Exercises Glute sets x 10      Lumbar Exercises: Seated   Other Seated Lumbar Exercises Seated forward and lateral flexion with green physioball 5 x 5 sec each direction      Lumbar Exercises: Supine   Pelvic Tilt 20 reps    Pelvic Tilt Limitations initial cues for form    Clam 15 reps    Clam Limitations 15x each BLE  Bridge Limitations Attempted bridges today but pt had increased pain in lumbar spine and was unable to perform at this time      Knee/Hip Exercises: Stretches   Active Hamstring Stretch Both;2 reps;30 seconds    Active Hamstring Stretch Limitations Seated at EOM with LE on 4" step      Knee/Hip Exercises: Standing   Hip Abduction Stengthening;Left;20 reps;Knee straight    Hip Extension Stengthening;Left;20 reps;Knee straight    Other Standing Knee Exercises Alternating marches on foam pad x 40 (20x each LE)      Manual Therapy   Manual Therapy Soft tissue mobilization;Myofascial release;Passive ROM;Muscle Energy Technique    Manual therapy comments --    Soft tissue mobilization STM and myofascial release along L glute med, piriformis, and thoracic/lumbar paraspinals    Passive ROM Passive B hip flexor stretch in prone 2 x 30 sec    Muscle Energy Technique Long sit test: Posterior rotation of L innominate noted this session that was not as apparent  following MET (R HS and L quad) x 8                  PT Education - 03/26/20 1927    Education Details Consistency with HEP as tolerated, moist heat at home for pain relief as needed. Discussed innominate rotation with skeleton model as visual demonstration. Pt instructed to bear weight equally through BLE as tolerated.    Person(s) Educated Patient    Methods Explanation;Demonstration;Tactile cues;Verbal cues    Comprehension Verbalized understanding;Returned demonstration;Verbal cues required;Tactile cues required            PT Short Term Goals - 03/12/20 1350      PT SHORT TERM GOAL #1   Title pt will present standing posture at midline    Baseline shifts to Lt at eval    Time 3    Period Weeks    Status New    Target Date 04/04/20             PT Long Term Goals - 03/12/20 1347      PT LONG TERM GOAL #1   Title bil hip MMT gross to 5/5 for necessary support    Baseline not appropriate to test at eval due to notable pelvic rotation    Time 8    Period Weeks    Status New    Target Date 05/09/20      PT LONG TERM GOAL #2   Title pt will be able to sleep without limitation by onset of pain    Baseline bil hip pain limits sleep at eval    Time 8    Period Weeks    Status New    Target Date 05/09/20      PT LONG TERM GOAL #3   Title pt will be able to ambulate for at least 30 min comfortably    Baseline limited to approx 123f at eval    Time 8    Period Weeks    Status New    Target Date 05/09/20      PT LONG TERM GOAL #4   Title pt will be independent with long term HEP in coordination with exercise for weight loss    Baseline would like to exercise for weight loss but is limited by pain at eval    Time 8    Period Weeks    Status New    Target Date 05/09/20  Plan - 03/26/20 1928    Clinical Impression Statement Patient arrived to PT today with exacerbated symptoms and demonstrated antalgic gait pattern. Pt was unable to  perform bridges due to increased pain in lumbar spine and felt minimal relief during other interventions. Pt was able to complete all exercises due to symptoms being tolerable.    Personal Factors and Comorbidities Time since onset of injury/illness/exacerbation;Comorbidity 2    Comorbidities h/o of c-section, past treatments with PT & chiropractic    Examination-Activity Limitations Reach Overhead;Bed Mobility;Sit;Bend;Sleep;Caring for Others;Carry;Squat;Stairs;Stand;Lift;Locomotion Level    Examination-Participation Restrictions Meal Prep;Occupation;Cleaning;Community Activity    Stability/Clinical Decision Making Evolving/Moderate complexity    Rehab Potential Good    PT Frequency 2x / week    PT Duration 6 weeks    PT Treatment/Interventions ADLs/Self Care Home Management;Cryotherapy;Electrical Stimulation;Gait training;Ultrasound;Traction;Moist Heat;Iontophoresis 58m/ml Dexamethasone;Stair training;Functional mobility training;Therapeutic activities;Therapeutic exercise;Neuromuscular re-education;Manual techniques;Patient/family education;Passive range of motion;Dry needling;Taping;Joint Manipulations;Spinal Manipulations    PT Next Visit Plan recheck pelvic rotation, core engagement, reinforce equal posture    PT Home Exercise Plan [id number] - hesh- self correction of Lt post innom, seated/standing flexion stretch, equal posture    Consulted and Agree with Plan of Care Patient           Patient will benefit from skilled therapeutic intervention in order to improve the following deficits and impairments:  Difficulty walking, Increased muscle spasms, Decreased activity tolerance, Pain, Improper body mechanics, Decreased strength, Postural dysfunction  Visit Diagnosis: Chronic midline low back pain without sciatica  Abnormal posture  Difficulty in walking, not elsewhere classified     Problem List Patient Active Problem List   Diagnosis Date Noted  . Chronic diastolic CHF  (congestive heart failure) ([place] 08/08/2018  . Depression, major, single episode, mild (HC[place] 08/08/2018  . Asthma exacerbation 12/28/2017  . Benign essential HTN 05/04/2016  . History of pre-eclampsia 02/12/2015  . Abnormal vaginal Pap smear 02/05/2015  . Abortion, spontaneous 02/05/2015  . Anxiety 02/05/2015  . LBP (low back pain) 02/05/2015  . Fatigue 02/05/2015  . Fibrositis 02/05/2015  . Folliculitis 079/89/2119 . Acid reflux 02/05/2015  . History of diabetes mellitus arising in pregnancy 02/05/2015  . High risk sexual behavior 02/05/2015  . 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/2015  . Class 3 severe obesity due to excess calories with serious comorbidity and body mass index (BMI) of 60.0 to 69.9 in adult ([place] 02/05/2015  . Obstructive sleep apnea 02/05/2015  . Vitamin D deficiency 02/05/2015  . Depression 02/05/2015  . Fibromyalgia 02/05/2015  . Abnormal C-reactive protein 10/24/2012  . Chronic low back pain 08/13/2012  . Absence of menstruation 09/22/2009  . Migraine without status migrainosus 07/22/2008     KHaydee Monica PT, DPT 03/26/20 7:38 PM  CS[facility] LLC[address]GPrince's Lakes N[place] [address]Phone: [phone]  Fax:  [phone] Name: Melanie VANWINKLEMRN: [id number] of Birth: [birth year]

## 2020-03-29 ENCOUNTER — Encounter: Payer: Self-pay | Admitting: Physical Medicine and Rehabilitation

## 2020-03-29 DIAGNOSIS — M545 Low back pain, unspecified: Secondary | ICD-10-CM

## 2020-03-29 DIAGNOSIS — M47816 Spondylosis without myelopathy or radiculopathy, lumbar region: Secondary | ICD-10-CM

## 2020-03-29 DIAGNOSIS — G8929 Other chronic pain: Secondary | ICD-10-CM

## 2020-03-30 ENCOUNTER — Telehealth: Payer: Self-pay | Admitting: Cardiovascular Disease

## 2020-03-30 NOTE — Telephone Encounter (Signed)
  Patient Consent for Virtual Visit         [name] has provided verbal consent on 03/30/2020 for a virtual visit (video or telephone).   CONSENT FOR VIRTUAL VISIT FOR:  [name]  By participating in this virtual visit I agree to the following:  I hereby voluntarily request, consent and authorize [facility] and its employed or contracted physicians, physician assistants, nurse practitioners or other licensed health care professionals (the Practitioner), to provide me with telemedicine health care services (the "Services") as deemed necessary by the treating Practitioner. I acknowledge and consent to receive the Services by the Practitioner via telemedicine. I understand that the telemedicine visit will involve communicating with the Practitioner through live audiovisual communication technology and the disclosure of certain medical information by electronic transmission. I acknowledge that I have been given the opportunity to request an in-person assessment or other available alternative prior to the telemedicine visit and am voluntarily participating in the telemedicine visit.  I understand that I have the right to withhold or withdraw my consent to the use of telemedicine in the course of my care at any time, without affecting my right to future care or treatment, and that the Practitioner or I may terminate the telemedicine visit at any time. I understand that I have the right to inspect all information obtained and/or recorded in the course of the telemedicine visit and may receive copies of available information for a reasonable fee.  I understand that some of the potential risks of receiving the Services via telemedicine include:  Marland Kitchen Delay or interruption in medical evaluation due to technological equipment failure or disruption; . Information transmitted may not be sufficient (e.g. poor resolution of images) to allow for appropriate medical decision making by the  Practitioner; and/or  . In rare instances, security protocols could fail, causing a breach of personal health information.  Furthermore, I acknowledge that it is my responsibility to provide information about my medical history, conditions and care that is complete and accurate to the best of my ability. I acknowledge that Practitioner's advice, recommendations, and/or decision may be based on factors not within their control, such as incomplete or inaccurate data provided by me or distortions of diagnostic images or specimens that may result from electronic transmissions. I understand that the practice of medicine is not an exact science and that Practitioner makes no warranties or guarantees regarding treatment outcomes. I acknowledge that a copy of this consent can be made available to me via my patient portal [facility] MyChart), or I can request a printed copy by calling the office of [facility].    I understand that my insurance will be billed for this visit.   I have read or had this consent read to me. . I understand the contents of this consent, which adequately explains the benefits and risks of the Services being provided via telemedicine.  . I have been provided ample opportunity to ask questions regarding this consent and the Services and have had my questions answered to my satisfaction. . I give my informed consent for the services to be provided through the use of telemedicine in my medical care

## 2020-03-31 NOTE — Progress Notes (Signed)
Virtual Visit via Telephone Note   This visit type was conducted due to national recommendations for restrictions regarding the COVID-19 Pandemic (e.g. social distancing) in an effort to limit this patient's exposure and mitigate transmission in our community.  Due to her co-morbid illnesses, this patient is at least at moderate risk for complications without adequate follow up.  This format is felt to be most appropriate for this patient at this time.  The patient did not have access to video technology/had technical difficulties with video requiring transitioning to audio format only (telephone).  All issues noted in this document were discussed and addressed.  No physical exam could be performed with this format.  Please refer to the patient's chart for her  consent to telehealth for [facility].   Date:  04/01/2020   ID:  [name], DOB 1985-07-01, MRN [id number] The patient was identified using 2 identifiers.  Patient Location: Home Provider Location: Office/Clinic  PCP:  Margaretann Loveless, [name]  Cardiologist:  Julien Nordmann, MD  Electrophysiologist:  None   Evaluation Performed:  Follow-Up Visit  Chief Complaint:  Nausea after taking Torsemide  History of Present Illness:    [name] is a 34 y.o. female with obesity, chronic diastolic heart failure, vitamin D deficiency, OSA, fibromyalgia. She was last seen by Dr. Mariah Milling via telemedicine 02/2019.   Previous stress echo 01/2016 was normal with exception of breast attenuation artifact. Echo 01/2016 with normal LVEF, normal right heart pressures, mild TR, mildly dilated left atrium. She had cardiac CTA with calcium score of zero per previous documentation. Previous cardiac monitor through [facility] 2017 with infrequent PAC/PVC.   She has been following with orthopedics and PT for lower back pain. Her MRI has been scheduled for 04/19/20.  Weight at her PCP 07/26/19 of 324 and 01/16/20 324lbs. Weight today on  home scale 300 pounds.   Telephone visit today for routine follow-up of diastolic heart failure.  She previously had nausea while taking Saxenda but has been taking this at night and reports this resolved her previous nausea. However, she is taking her Torsemide "as needed" as after 2-3 hours she will start to feel nauseated. Tells me it has been happening for the last few months.  We discussed trialing taking torsemide after eating. She often skips breakfast and we discussed that she could take after lunch.  She reports no chest pain, nausea, tightness.  She does report lower extremity edema which she attributes to not taking her torsemide consistently.  She endorses eating a low-salt diet and notices that when she has increased protein intake that she does not swell as much.  We discussed the relationship between protein and fluid retention.  The patient does not have symptoms concerning for COVID-19 infection (fever, chills, cough, or new shortness of breath).    Past Medical History:  Diagnosis Date  . Anxiety   . Depression   . Elevated blood pressure   . GERD (gastroesophageal reflux disease)   . Hypertension   . Sleep apnea    Past Surgical History:  Procedure Laterality Date  . CESAREAN SECTION     at 28 weeks for pre-eclampsia, pulmonary edema, gestational DM.  Marland Kitchen dilationand curettage of Uterus  2007   SAB  . Headache:2009     Headache wellness consult. during pregnancy     Current Meds  Medication Sig  . albuterol (VENTOLIN HFA) 108 (90 Base) MCG/ACT inhaler Inhale 2 puffs into the lungs every 6 (six) hours  as needed for wheezing or shortness of breath.  Marland Kitchen FLUoxetine (PROZAC) 20 MG tablet Take 1 tablet (20 mg total) by mouth daily.  . Insulin Pen Needle (BD PEN NEEDLE NANO 2ND GEN) 32G X 4 MM MISC To use daily with saxenda injection  . Liraglutide -Weight Management (SAXENDA) 18 MG/3ML SOPN Inject 0.1 mLs (0.6 mg total) into the skin daily. Increase by 0.6mg  weekly until  3mg  daily achieved.  . medroxyPROGESTERone (DEPO-PROVERA) 150 MG/ML injection Inject 150 mg into the muscle every 3 (three) months.  . meloxicam (MOBIC) 15 MG tablet Take 1 tablet (15 mg total) by mouth daily as needed for pain.  . metroNIDAZOLE (METROGEL) 0.75 % vaginal gel Place 1 Applicatorful vaginally 2 (two) times daily as needed.  . montelukast (SINGULAIR) 10 MG tablet Take 10 mg by mouth at bedtime as needed.  . nystatin cream (MYCOSTATIN) Apply 1 application topically 2 (two) times daily as needed for dry skin.  omeprazole (PRILOSEC) 40 MG capsule Take 1 capsule (40 mg total) daily by mouth.  . ondansetron (ZOFRAN) 4 MG tablet Take 1 tablet (4 mg total) by mouth every 8 (eight) hours as needed.  . potassium chloride (KLOR-CON) 10 MEQ tablet Take 1 tablet (10 mEq total) by mouth daily.  . propranolol (INDERAL) 20 MG tablet Take 1 tablet (20 mg total) by mouth 3 (three) times daily as needed. For palpitations  . torsemide (DEMADEX) 20 MG tablet Take 2 tablets (40 mg total) by mouth daily.  . Vitamin D, Ergocalciferol, (DRISDOL) 1.25 MG (50000 UNIT) CAPS capsule Take 1 capsule (50,000 Units total) by mouth every 7 (seven) days.     Allergies:   Lactose intolerance (gi), Penicillins, and Pineapple   Social History   Tobacco Use  . Smoking status: Current Some Day Smoker    Types: Cigars  . Smokeless tobacco: Never Used  . Tobacco comment: smokes cigars  Vaping Use  . Vaping Use: Never used  Substance Use Topics  . Alcohol use: Yes    Alcohol/week: 0.0 standard drinks    Comment: ocassional alcohol use; Mixed drinks once every 2-3 months  . Drug use: No     Family Hx: The patient's family history includes Hypertension in her maternal grandmother.  ROS:   Please see the history of present illness.     Review of Systems  Constitutional: Negative for chills, fever and malaise/fatigue.  Cardiovascular: Positive for palpitations. Negative for chest pain, dyspnea on exertion,  leg swelling, near-syncope, orthopnea and syncope.  Respiratory: Negative for cough, shortness of breath and wheezing.   Musculoskeletal: Positive for back pain.  Gastrointestinal: Positive for nausea. Negative for vomiting.  Neurological: Negative for dizziness, light-headedness and weakness.   All other systems reviewed and are negative.  Prior CV studies:   The following studies were reviewed today:  Long term monitor 04/15/19 Normal sinus rhythm   Avg HR of 96 bpm. 1 run of Ventricular Tachycardia occurred lasting 4 beats with a max rate of 190 bpm (avg 157 bpm).    2 Supraventricular Tachycardia/atrial tachycardia runs occurred, the run with the fastest interval lasting 11.9 secs with a max rate of 203 bpm (avg 166 bpm);  the run with the fastest interval was also the longest.    Isolated SVEs were rare (<1.0%), SVE Couplets were rare (<1.0%), and no SVE Triplets were present. Isolated VEs were rare (<1.0%), and no VE Couplets or VE Triplets were present.   Patient triggered events associated with PACs, sometimes with  atrial tachycardia, other triggered events not associated with any significant arrhythmia.    Labs/Other Tests and Data Reviewed:    EKG:  No ECG reviewed.  Recent Labs: 01/16/2020: ALT 16; BUN 10; Creatinine, Ser 0.88; Hemoglobin 12.5; Platelets 282; Potassium 4.5; Sodium 144; TSH 1.420   Recent Lipid Panel Lab Results  Component Value Date/Time   CHOL 150 07/24/2017 10:17 AM   TRIG 51 07/24/2017 10:17 AM   HDL 62 07/24/2017 10:17 AM   CHOLHDL 2.8 06/24/2016 09:56 AM   LDLCALC 78 07/24/2017 10:17 AM    Wt Readings from Last 3 Encounters:  04/01/20 300 lb (136.1 kg)  01/16/20 (!) 324 lb (147 kg)  07/26/19 294 lb (133.4 kg)     Objective:    Vital Signs:  BP (!) 143/89 (BP Location: Left Arm, Patient Position: Sitting) Comment: unable to obtain-machine not working  Pulse 85   Ht 5\' 3"  (1.6 m)   Wt 300 lb (136.1 kg)   BMI 53.14 kg/m    VITAL  SIGNS:  reviewed  ASSESSMENT & PLAN:    1. Diastolic heart failure -Physical assessment limited as this was a telephone visit.  Reports difficulty taking her torsemide as she tells me she gets nauseous 2 to 3 hours after taking has not been taking consistently.  She does notice increased lower extremity edema when she does not take her torsemide.  Discussed trialing taking her torsemide after food and additional tactics to prevent nausea.  Nausea is not a common side effect of torsemide but cannot exclude that it is contributory.  She does have PRN Zofran as prescribed by her primary care provider.  Recommend low-sodium, heart healthy diet.  Recommend elevating lower extremities when sitting.  2. Palpitations - Reports these are intermittent and not bothersome.  Well-controlled with PRN propanolol.  3. Morbid obesity - Following with her primary care provider.  Presently on Saxenda which she takes at night.   Time:   Today, I have spent 12 minutes with the patient with telehealth technology discussing the above problems.     Medication Adjustments/Labs and Tests Ordered: Current medicines are reviewed at length with the patient today.  Concerns regarding medicines are outlined above.   Tests Ordered: None ordered today. Will plan for EKG at her next in-office visit for routine monitoring.   Medication Changes: None ordered today.   Follow Up: Follow up In Person in 6 month(s) with Dr. 05-07-1992 or APP.  Signed, Mariah Milling, NP  04/01/2020 10:14 AM    [facility]

## 2020-04-01 ENCOUNTER — Encounter: Payer: Self-pay | Admitting: Family

## 2020-04-01 ENCOUNTER — Other Ambulatory Visit: Payer: Self-pay

## 2020-04-01 ENCOUNTER — Ambulatory Visit: Payer: Managed Care, Other (non HMO)

## 2020-04-01 ENCOUNTER — Telehealth (INDEPENDENT_AMBULATORY_CARE_PROVIDER_SITE_OTHER): Payer: Managed Care, Other (non HMO) | Admitting: Family

## 2020-04-01 VITALS — BP 143/89 | HR 85 | Ht 63.0 in | Wt 300.0 lb

## 2020-04-01 DIAGNOSIS — R002 Palpitations: Secondary | ICD-10-CM

## 2020-04-01 DIAGNOSIS — I5032 Chronic diastolic (congestive) heart failure: Secondary | ICD-10-CM

## 2020-04-01 NOTE — Patient Instructions (Addendum)
Medication Instructions:   Recommend taking your Torsemide after you eat a meal to see if this helps with nausea. You could also start by taking one tablet for a few days then increasing to two. It is important to take your Torsemide regularly as it helps prevent fluid retention, swelling, breathing difficulties.  *If you need a refill on your cardiac medications before your next appointment, please call your pharmacy*  Lab Work: None ordered today.   Testing/Procedures: None ordered today. We recommend an EKG at your next in office visit for monitoring.   Follow-Up: At [facility], you and your health needs are our priority.  As part of our continuing mission to provide you with exceptional heart care, we have created designated Provider Care Teams.  These Care Teams include your primary Cardiologist (physician) and Advanced Practice Providers (APPs -  Physician Assistants and Nurse Practitioners) who all work together to provide you with the care you need, when you need it.  We recommend signing up for the patient portal called "MyChart".  Sign up information is provided on this After Visit Summary.  MyChart is used to connect with patients for Virtual Visits (Telemedicine).  Patients are able to view lab/test results, encounter notes, upcoming appointments, etc.  Non-urgent messages can be sent to your provider as well.   To learn more about what you can do with MyChart, go to [email or web address].    Your next appointment:   6 month(s)  The format for your next appointment:   In Person  Provider:   You may see Julien Nordmann, MD or one of the following Advanced Practice Providers on your designated Care Team:    Nicolasa Ducking, NP  Gillian Shields, NP  Eula Listen, [name]  Marisue Ivan, [name]  Cadence Fransico Michael, [place]  Other Instructions   Recommend elevating your legs when sitting. This will help prevent swelling.   If you want to incorporate some cardiovascular  exercise, you could consider purchasing and under-desk exercise bike. They are around $30-40 on [employer].    Recommend a diet that is low in sodium (salt) and has adequate protein as this helps prevent swelling as well.   Some ways to reduce or prevent nausea include: eating small frequent meals, avoiding greasy & spicy foods, peppermint (either gum or hard candy).

## 2020-04-03 ENCOUNTER — Ambulatory Visit: Payer: Managed Care, Other (non HMO) | Admitting: Physical Therapy

## 2020-04-03 ENCOUNTER — Other Ambulatory Visit: Payer: Self-pay

## 2020-04-03 DIAGNOSIS — R262 Difficulty in walking, not elsewhere classified: Secondary | ICD-10-CM

## 2020-04-03 DIAGNOSIS — M545 Low back pain, unspecified: Secondary | ICD-10-CM

## 2020-04-03 DIAGNOSIS — G8929 Other chronic pain: Secondary | ICD-10-CM

## 2020-04-03 DIAGNOSIS — R293 Abnormal posture: Secondary | ICD-10-CM

## 2020-04-03 NOTE — Therapy (Signed)
[facility] [address] [place], [place], [address] Phone: [phone]   Fax:  [phone]  Physical Therapy Treatment  Patient Details  Name: [name] MRN: [id number] Date of Birth: 12-16-1985 Referring Provider (PT): Tyrell Antonio, MD   Encounter Date: 04/03/2020   PT End of Session - 04/03/20 1443    Visit Number 4    Number of Visits 13    Date for PT Re-Evaluation 05/09/20    Authorization Type [employer]- [place] at visit 6  (vaso not covered)    PT Start Time 1404    PT Stop Time 1443    PT Time Calculation (min) 39 min    Activity Tolerance Patient tolerated treatment well    Behavior During Therapy [facility] for tasks assessed/performed           Past Medical History:  Diagnosis Date   Anxiety    Depression    Elevated blood pressure    GERD (gastroesophageal reflux disease)    Hypertension    Sleep apnea     Past Surgical History:  Procedure Laterality Date   CESAREAN SECTION     at 28 weeks for pre-eclampsia, pulmonary edema, gestational DM.   dilationand curettage of Uterus  2007   SAB   Headache:2009     Headache wellness consult. during pregnancy    There were no vitals filed for this visit.   Subjective Assessment - 04/03/20 1406    Subjective Kind of rested Mon and Tues and took meds. My friends and I went out Sat and had to walk a slight incline which required her to stop and rest. Had to go [place] which took a lot out of me. Show was about 2 hours and had just started to relax when I had to walk back. I have been trying to stand more centered. I do get some relief on days of PT but not so much on my own. I am changing my diet to be healthier.    Patient Stated Goals decrease pain, sense of normalcy, washing dishes    Currently in Pain? Yes    Pain Score 6     Pain Location Back    Pain Orientation Lower;Medial    Pain Descriptors / Indicators Aching    Aggravating  Factors  walking, stairs    Pain Relieving Factors rest- leaning back                             [facility] Adult PT Treatment/Exercise - 04/03/20 0001      Lumbar Exercises: Stretches   Lower Trunk Rotation Limitations small range in wide hooklying    Pelvic Tilt Limitations post pelvic tilt with breathing       Lumbar Exercises: Standing   Other Standing Lumbar Exercises standing post pelvic tilt      Lumbar Exercises: Seated   Other Seated Lumbar Exercises pelvic tilt/resting posture    Other Seated Lumbar Exercises march with pelvic tilt      Lumbar Exercises: Supine   Pelvic Tilt Limitations post pelvic tilt with iso adduction using ball    Bent Knee Raise 15 reps    Bent Knee Raise Limitations hooklying adduction+post pelvic tilt+march      Manual Therapy   Manual therapy comments manual traction to lumbar spine in prone over pillows    Soft tissue mobilization bil piriformis, bil parapsinals lower thoracic region  PT Short Term Goals - 03/12/20 1350      PT SHORT TERM GOAL #1   Title pt will present standing posture at midline    Baseline shifts to Lt at eval    Time 3    Period Weeks    Status New    Target Date 04/04/20             PT Long Term Goals - 03/12/20 1347      PT LONG TERM GOAL #1   Title bil hip MMT gross to 5/5 for necessary support    Baseline not appropriate to test at eval due to notable pelvic rotation    Time 8    Period Weeks    Status New    Target Date 05/09/20      PT LONG TERM GOAL #2   Title pt will be able to sleep without limitation by onset of pain    Baseline bil hip pain limits sleep at eval    Time 8    Period Weeks    Status New    Target Date 05/09/20      PT LONG TERM GOAL #3   Title pt will be able to ambulate for at least 30 min comfortably    Baseline limited to approx 186ft at eval    Time 8    Period Weeks    Status New    Target Date 05/09/20      PT LONG  TERM GOAL #4   Title pt will be independent with long term HEP in coordination with exercise for weight loss    Baseline would like to exercise for weight loss but is limited by pain at eval    Time 8    Period Weeks    Status New    Target Date 05/09/20                 Plan - 04/03/20 1443    Clinical Impression Statement manual therapy well tolerated with notable spasm in bil piriformis and lower thoracic paraspinals- hinge point when slouching. Increased abdominal challenges in supine and worked on activation in seated and standing. asked that before going to bed she lays in hooklying, performs deep breathing, LTRs and post pelvic tilts to decrease spasm.    PT Treatment/Interventions ADLs/Self Care Home Management;Cryotherapy;Electrical Stimulation;Gait training;Ultrasound;Traction;Moist Heat;Iontophoresis 4mg /ml Dexamethasone;Stair training;Functional mobility training;Therapeutic activities;Therapeutic exercise;Neuromuscular re-education;Manual techniques;Patient/family education;Passive range of motion;Dry needling;Taping;Joint Manipulations;Spinal Manipulations    PT Next Visit Plan cont manual PRN, progress core/LE strengthening    PT Home Exercise Plan [id number] - hesh- self correction of Lt post innom, seated/standing flexion stretch, equal posture    Consulted and Agree with Plan of Care Patient           Patient will benefit from skilled therapeutic intervention in order to improve the following deficits and impairments:  Difficulty walking, Increased muscle spasms, Decreased activity tolerance, Pain, Improper body mechanics, Decreased strength, Postural dysfunction  Visit Diagnosis: Chronic midline low back pain without sciatica  Abnormal posture  Difficulty in walking, not elsewhere classified     Problem List Patient Active Problem List   Diagnosis Date Noted   Chronic diastolic CHF (congestive heart failure) (HCC) 08/08/2018   Depression, major, single  episode, mild (HCC) 08/08/2018   Asthma exacerbation 12/28/2017   Benign essential HTN 05/04/2016   History of pre-eclampsia 02/12/2015   Abnormal vaginal Pap smear 02/05/2015   Abortion, spontaneous 02/05/2015   Anxiety 02/05/2015   LBP (  low back pain) 02/05/2015   Fatigue 02/05/2015   Fibrositis 02/05/2015   Folliculitis 02/05/2015   Acid reflux 02/05/2015   History of diabetes mellitus arising in pregnancy 02/05/2015   High risk sexual behavior 02/05/2015   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/2015   Class 3 severe obesity due to excess calories with serious comorbidity and body mass index (BMI) of 60.0 to 69.9 in adult (HCC) 02/05/2015   Obstructive sleep apnea 02/05/2015   Vitamin D deficiency 02/05/2015   Depression 02/05/2015   Fibromyalgia 02/05/2015   Abnormal C-reactive protein 10/24/2012   Chronic low back pain 08/13/2012   Absence of menstruation 09/22/2009   Migraine without status migrainosus 07/22/2008    Tejay Hubert C. Florabelle Cardin PT, DPT 04/03/20 2:45 PM   [facility] [address] [place], [place], [address] Phone: [phone]   Fax:  [phone]  Name: [name] MRN: [id number] Date of Birth: Nov 06, 1985

## 2020-04-07 ENCOUNTER — Ambulatory Visit: Payer: Managed Care, Other (non HMO)

## 2020-04-07 ENCOUNTER — Other Ambulatory Visit: Payer: Self-pay

## 2020-04-07 DIAGNOSIS — M545 Low back pain, unspecified: Secondary | ICD-10-CM | POA: Diagnosis not present

## 2020-04-07 DIAGNOSIS — R262 Difficulty in walking, not elsewhere classified: Secondary | ICD-10-CM

## 2020-04-07 DIAGNOSIS — R293 Abnormal posture: Secondary | ICD-10-CM

## 2020-04-07 DIAGNOSIS — G8929 Other chronic pain: Secondary | ICD-10-CM

## 2020-04-07 NOTE — Therapy (Signed)
[facility] [address] [place], [place], [address] Phone: [phone]   Fax:  [phone]  Physical Therapy Treatment  Patient Details  Name: [name] MRN: [id number] Date of Birth: [birth year] Referring Provider (PT): Tyrell Antonio, MD   Encounter Date: 04/07/2020   PT End of Session - 04/07/20 1758    Visit Number 5    Number of Visits 13    Date for PT Re-Evaluation 05/09/20    Authorization Type [employer]- [place] at visit 6  (vaso not covered)    PT Start Time 1746    PT Stop Time 1826    PT Time Calculation (min) 40 min    Activity Tolerance Patient tolerated treatment well    Behavior During Therapy [facility] for tasks assessed/performed           Past Medical History:  Diagnosis Date  . Anxiety   . Depression   . Elevated blood pressure   . GERD (gastroesophageal reflux disease)   . Hypertension   . Sleep apnea     Past Surgical History:  Procedure Laterality Date  . CESAREAN SECTION     at 28 weeks for pre-eclampsia, pulmonary edema, gestational DM.  Marland Kitchen dilationand curettage of Uterus  2007   SAB  . Headache:2009     Headache wellness consult. during pregnancy    There were no vitals filed for this visit.   Subjective Assessment - 04/07/20 1758    Subjective "I'm feeling okay. I'm not having any pain right now, but I have noticed that I get more pain if I'm walking even 5 minutes or more. I will go into [employer] and by the time I get to a certain section, I have a lot of pain and need to lean on the cart." Pt reports doing lower trunk rotations and trying to do pelvic tilts at home. Pt states she took Tylenol prior to PT session.    How long can you walk comfortably? about 100 ft.    Patient Stated Goals decrease pain, sense of normalcy, washing dishes    Currently in Pain? Yes    Pain Score 5    0/10 upon arrival then 5/10 during beginning of session   Pain Location Back    Pain Orientation  Lower;Medial    Pain Descriptors / Indicators Aching              [facility] PT Assessment - 04/07/20 0001      Assessment   Medical Diagnosis chronic LBP    Referring Provider (PT) Tyrell Antonio, MD                         [facility] Adult PT Treatment/Exercise - 04/07/20 0001      Ambulation/Gait   Ambulation/Gait Yes    Gait Comments : 360 feet then seated rest break at 48 sec. Began walking again at 5 min and ambulated 180 feet. Seated rest break due to increased low back pain during ambulation.      Self-Care   Self-Care --      Lumbar Exercises: Stretches   Lower Trunk Rotation Limitations small range in wide hooklying x 30 (15x each direction)    Pelvic Tilt Limitations post pelvic tilt with breathing x 20      Lumbar Exercises: Seated   Other Seated Lumbar Exercises Seated lateral flexion with green swiss ball 10 x 5 sec holds    Other Seated Lumbar Exercises  posterior pelvic tilt with alternating marches in supine 30x      Lumbar Exercises: Supine   Pelvic Tilt Limitations post pelvic tilt with iso adduction using ball      Manual Therapy   Manual Therapy Soft tissue mobilization;Passive ROM    Manual therapy comments manual traction to lumbar spine in prone over pillows    Soft tissue mobilization bil piriformis, bil parapsinals lower thoracic region    Passive ROM Passive B hip flexor stretch in prone 2 x 30 sec                    PT Short Term Goals - 04/07/20 1927      PT SHORT TERM GOAL #1   Title pt will present standing posture at midline    Baseline shifts to Lt at eval    Time 3    Period Weeks    Status On-going    Target Date 04/04/20             PT Long Term Goals - 03/12/20 1347      PT LONG TERM GOAL #1   Title bil hip MMT gross to 5/5 for necessary support    Baseline not appropriate to test at eval due to notable pelvic rotation    Time 8    Period Weeks    Status New    Target Date 05/09/20      PT  LONG TERM GOAL #2   Title pt will be able to sleep without limitation by onset of pain    Baseline bil hip pain limits sleep at eval    Time 8    Period Weeks    Status New    Target Date 05/09/20      PT LONG TERM GOAL #3   Title pt will be able to ambulate for at least 30 min comfortably    Baseline limited to approx 158ft at eval    Time 8    Period Weeks    Status New    Target Date 05/09/20      PT LONG TERM GOAL #4   Title pt will be independent with long term HEP in coordination with exercise for weight loss    Baseline would like to exercise for weight loss but is limited by pain at eval    Time 8    Period Weeks    Status New    Target Date 05/09/20                 Plan - 04/07/20 1920    Clinical Impression Statement Pt experienced increased low back pain 2 min and 48 seconds into requiring seated rest break before beginning ambulation again at 5 min (~48min break) of . Pt demonstrated good tolerance to posterior pelvic tilt with other activities today and decreased TTP in B piriformis and B thoracic/lumbar paraspinals today during STM.    Personal Factors and Comorbidities Time since onset of injury/illness/exacerbation;Comorbidity 2    Comorbidities h/o of c-section, past treatments with PT & chiropractic    Examination-Activity Limitations Reach Overhead;Bed Mobility;Sit;Bend;Sleep;Caring for Others;Carry;Squat;Stairs;Stand;Lift;Locomotion Level    Examination-Participation Restrictions Meal Prep;Occupation;Cleaning;Community Activity    PT Treatment/Interventions ADLs/Self Care Home Management;Cryotherapy;Electrical Stimulation;Gait training;Ultrasound;Traction;Moist Heat;Iontophoresis 4mg /ml Dexamethasone;Stair training;Functional mobility training;Therapeutic activities;Therapeutic exercise;Neuromuscular re-education;Manual techniques;Patient/family education;Passive range of motion;Dry needling;Taping;Joint Manipulations;Spinal Manipulations    PT Next  Visit Plan cont manual PRN, progress core/LE strengthening    PT Home Exercise Plan [id number] - hesh- self  correction of Lt post innom, seated/standing flexion stretch, equal posture    Consulted and Agree with Plan of Care Patient           Patient will benefit from skilled therapeutic intervention in order to improve the following deficits and impairments:  Difficulty walking, Increased muscle spasms, Decreased activity tolerance, Pain, Improper body mechanics, Decreased strength, Postural dysfunction  Visit Diagnosis: Chronic midline low back pain without sciatica  Abnormal posture  Difficulty in walking, not elsewhere classified     Problem List Patient Active Problem List   Diagnosis Date Noted  . Chronic diastolic CHF (congestive heart failure) (HCC) 08/08/2018  . Depression, major, single episode, mild (HCC) 08/08/2018  . Asthma exacerbation 12/28/2017  . Benign essential HTN 05/04/2016  . History of pre-eclampsia 02/12/2015  . Abnormal vaginal Pap smear 02/05/2015  . Abortion, spontaneous 02/05/2015  . Anxiety 02/05/2015  . LBP (low back pain) 02/05/2015  . Fatigue 02/05/2015  . Fibrositis 02/05/2015  . Folliculitis 02/05/2015  . Acid reflux 02/05/2015  . History of diabetes mellitus arising in pregnancy 02/05/2015  . High risk sexual behavior 02/05/2015  . 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/2015  . Class 3 severe obesity due to excess calories with serious comorbidity and body mass index (BMI) of 60.0 to 69.9 in adult (HCC) 02/05/2015  . Obstructive sleep apnea 02/05/2015  . Vitamin D deficiency 02/05/2015  . Depression 02/05/2015  . Fibromyalgia 02/05/2015  . Abnormal C-reactive protein 10/24/2012  . Chronic low back pain 08/13/2012  . Absence of menstruation 09/22/2009  . Migraine without status migrainosus 07/22/2008      Rhea Bleacher, PT, DPT 04/07/20 7:27 PM  [facility] [address] [place], [place],  [address] Phone: [phone]   Fax:  [phone]  Name: AFRICA MASAKI MRN: [id number] Date of Birth: [birth year]

## 2020-04-09 ENCOUNTER — Ambulatory Visit: Payer: Managed Care, Other (non HMO)

## 2020-04-13 ENCOUNTER — Encounter: Payer: Self-pay | Admitting: Physician Assistant

## 2020-04-13 ENCOUNTER — Other Ambulatory Visit: Payer: Self-pay

## 2020-04-13 ENCOUNTER — Ambulatory Visit (INDEPENDENT_AMBULATORY_CARE_PROVIDER_SITE_OTHER): Payer: Managed Care, Other (non HMO) | Admitting: Physician Assistant

## 2020-04-13 VITALS — BP 127/85 | HR 103 | Temp 98.3°F | Resp 16 | Ht 65.0 in | Wt 335.9 lb

## 2020-04-13 DIAGNOSIS — Z6841 Body Mass Index (BMI) 40.0 and over, adult: Secondary | ICD-10-CM | POA: Diagnosis not present

## 2020-04-13 DIAGNOSIS — Z3042 Encounter for surveillance of injectable contraceptive: Secondary | ICD-10-CM | POA: Diagnosis not present

## 2020-04-13 MED ORDER — MEDROXYPROGESTERONE ACETATE 150 MG/ML IM SUSP
150.0000 mg | Freq: Once | INTRAMUSCULAR | Status: AC
  Administered 2020-04-13: 150 mg via INTRAMUSCULAR

## 2020-04-13 NOTE — Progress Notes (Signed)
Established patient visit   Patient: [name]   DOB: 1985/07/27   34 y.o. Female  MRN: [id number] Visit Date: 04/13/2020  Today's healthcare provider: Margaretann Loveless, [name]   Chief Complaint  Patient presents with  . Obesity   Subjective    HPI  Obesity: patient here to talk about weight loss counseling. Reports she was placed on Saxenda last time but has not been doing the [place] because feels like she is not able to do nothing for it to work. "Like exercising."  Wt Readings from Last 3 Encounters:  04/13/20 (!) 335 lb 14.4 oz (152.4 kg)  04/01/20 300 lb (136.1 kg)  01/16/20 (!) 324 lb (147 kg)   She is also here to have her Depo Provera.  Patient Active Problem List   Diagnosis Date Noted  . Chronic diastolic CHF (congestive heart failure) (HCC) 08/08/2018  . Depression, major, single episode, mild (HCC) 08/08/2018  . Asthma exacerbation 12/28/2017  . Benign essential HTN 05/04/2016  . History of pre-eclampsia 02/12/2015  . Abnormal vaginal Pap smear 02/05/2015  . Abortion, spontaneous 02/05/2015  . Anxiety 02/05/2015  . LBP (low back pain) 02/05/2015  . Fatigue 02/05/2015  . Fibrositis 02/05/2015  . Folliculitis 02/05/2015  . Acid reflux 02/05/2015  . History of diabetes mellitus arising in pregnancy 02/05/2015  . High risk sexual behavior 02/05/2015  . 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/2015  . Class 3 severe obesity due to excess calories with serious comorbidity and body mass index (BMI) of 60.0 to 69.9 in adult (HCC) 02/05/2015  . Obstructive sleep apnea 02/05/2015  . Vitamin D deficiency 02/05/2015  . Depression 02/05/2015  . Fibromyalgia 02/05/2015  . Abnormal C-reactive protein 10/24/2012  . Chronic low back pain 08/13/2012  . Absence of menstruation 09/22/2009  . Migraine without status migrainosus 07/22/2008   Past Medical History:  Diagnosis Date  . Anxiety   . Depression   . Elevated blood pressure   . GERD (gastroesophageal reflux disease)    . Hypertension   . Sleep apnea        Medications: Outpatient Medications Prior to Visit  Medication Sig  . albuterol (VENTOLIN HFA) 108 (90 Base) MCG/ACT inhaler Inhale 2 puffs into the lungs every 6 (six) hours as needed for wheezing or shortness of breath.  Marland Kitchen FLUoxetine (PROZAC) 20 MG tablet Take 1 tablet (20 mg total) by mouth daily.  . medroxyPROGESTERone (DEPO-PROVERA) 150 MG/ML injection Inject 150 mg into the muscle every 3 (three) months.  . meloxicam (MOBIC) 15 MG tablet Take 1 tablet (15 mg total) by mouth daily as needed for pain.  . metroNIDAZOLE (METROGEL) 0.75 % vaginal gel Place 1 Applicatorful vaginally 2 (two) times daily as needed.  . montelukast (SINGULAIR) 10 MG tablet Take 10 mg by mouth at bedtime as needed.  . nystatin cream (MYCOSTATIN) Apply 1 application topically 2 (two) times daily as needed for dry skin.  Marland Kitchen omeprazole (PRILOSEC) 40 MG capsule Take 1 capsule (40 mg total) daily by mouth.  . ondansetron (ZOFRAN) 4 MG tablet Take 1 tablet (4 mg total) by mouth every 8 (eight) hours as needed.  . potassium chloride (KLOR-CON) 10 MEQ tablet Take 1 tablet (10 mEq total) by mouth daily.  . propranolol (INDERAL) 20 MG tablet Take 1 tablet (20 mg total) by mouth 3 (three) times daily as needed. For palpitations  . torsemide (DEMADEX) 20 MG tablet Take 2 tablets (40 mg total) by mouth daily.  . Vitamin D, Ergocalciferol, (DRISDOL)  1.25 MG (50000 UNIT) CAPS capsule Take 1 capsule (50,000 Units total) by mouth every 7 (seven) days.  . Insulin Pen Needle (BD PEN NEEDLE NANO 2ND GEN) 32G X 4 MM MISC To use daily with saxenda injection (Patient not taking: Reported on 04/13/2020)  . Liraglutide -Weight Management (SAXENDA) 18 MG/3ML SOPN Inject 0.1 mLs (0.6 mg total) into the skin daily. Increase by 0.6mg  weekly until 3mg  daily achieved. (Patient not taking: Reported on 04/13/2020)   No facility-administered medications prior to visit.    Review of Systems   Constitutional: Positive for fatigue.  Respiratory: Negative.   Cardiovascular: Negative.   Musculoskeletal: Positive for back pain, gait problem and myalgias.  Psychiatric/Behavioral: Positive for dysphoric mood.    Last CBC Lab Results  Component Value Date   WBC 6.6 01/16/2020   HGB 12.5 01/16/2020   HCT 36.3 01/16/2020   MCV 91 01/16/2020   MCH 31.4 01/16/2020   RDW 13.4 01/16/2020   PLT 282 01/16/2020   Last metabolic panel Lab Results  Component Value Date   GLUCOSE 108 (H) 01/16/2020   NA 144 01/16/2020   K 4.5 01/16/2020   CL 106 01/16/2020   CO2 24 01/16/2020   BUN 10 01/16/2020   CREATININE 0.88 01/16/2020   GFRNONAA 85 01/16/2020   GFRAA 98 01/16/2020   CALCIUM 9.2 01/16/2020   PROT 6.5 01/16/2020   ALBUMIN 3.8 01/16/2020   LABGLOB 2.7 01/16/2020   AGRATIO 1.4 01/16/2020   BILITOT 0.2 01/16/2020   ALKPHOS 98 01/16/2020   AST 17 01/16/2020   ALT 16 01/16/2020   ANIONGAP 10 12/15/2016   Last lipids Lab Results  Component Value Date   CHOL 150 07/24/2017   HDL 62 07/24/2017   LDLCALC 78 07/24/2017   TRIG 51 07/24/2017   CHOLHDL 2.8 06/24/2016      Objective    BP 127/85 (BP Location: Left Wrist, Patient Position: Sitting, Cuff Size: Normal)   Pulse (!) 103   Temp 98.3 F (36.8 C) (Oral)   Resp 16   Ht 5\' 5"  (1.651 m)   Wt (!) 335 lb 14.4 oz (152.4 kg)   BMI 55.90 kg/m  BP Readings from Last 3 Encounters:  04/13/20 127/85  04/01/20 (!) 143/89  02/10/20 117/83   Wt Readings from Last 3 Encounters:  04/13/20 (!) 335 lb 14.4 oz (152.4 kg)  04/01/20 300 lb (136.1 kg)  01/16/20 (!) 324 lb (147 kg)      Physical Exam Vitals reviewed.  Constitutional:      General: She is not in acute distress.    Appearance: Normal appearance. She is well-developed. She is obese. She is not ill-appearing or diaphoretic.  Cardiovascular:     Rate and Rhythm: Normal rate and regular rhythm.     Pulses: Normal pulses.     Heart sounds: Normal heart  sounds. No murmur heard.  No friction rub. No gallop.   Pulmonary:     Effort: Pulmonary effort is normal. No respiratory distress.     Breath sounds: Normal breath sounds. No wheezing or rales.  Musculoskeletal:     Cervical back: Normal range of motion and neck supple.  Neurological:     Mental Status: She is alert.      No results found for any visits on 04/13/20.  Assessment & Plan     1. Class 3 severe obesity due to excess calories with serious comorbidity and body mass index (BMI) of 50.0 to 59.9 in adult [facility]) Has been  having significant low back pain. Undergoing MRI on Sunday, 04/19/20. Will await results and plan for back from neurosurgery. Patient has tried and failed Saxenda, Phentermine (now contraindicated due to heart disease) and Contrave. May consider bariatrics referral if no back surgery is planned, or if weight loss need prior to back procedures. She will call with plan discussed.   2. Encounter for surveillance of injectable contraceptive Depo provera given without issue. Return in 3 months.  - medroxyPROGESTERone (DEPO-PROVERA) injection 150 mg   No follow-ups on file.      Delmer Islam, [name], have reviewed all documentation for this visit. The documentation on 04/15/20 for the exam, diagnosis, procedures, and orders are all accurate and complete.   Reine Just  [facility] [phone] (phone) [phone] (fax)  [facility]

## 2020-04-14 ENCOUNTER — Ambulatory Visit: Payer: Managed Care, Other (non HMO)

## 2020-04-14 ENCOUNTER — Other Ambulatory Visit: Payer: Self-pay

## 2020-04-14 DIAGNOSIS — R262 Difficulty in walking, not elsewhere classified: Secondary | ICD-10-CM

## 2020-04-14 DIAGNOSIS — M545 Low back pain, unspecified: Secondary | ICD-10-CM

## 2020-04-14 DIAGNOSIS — R293 Abnormal posture: Secondary | ICD-10-CM

## 2020-04-14 NOTE — Therapy (Addendum)
[place], [place], [address] Phone: [phone]   Fax:  [phone]  Physical Therapy Treatment  Patient Details  Name: [name] MRN: [id number] Date of Birth: 14-Apr-1986 Referring Provider (PT): Magnus Sinning, MD   Encounter Date: 04/14/2020   PT End of Session - 04/14/20 1902    Visit Number 6    Number of Visits 13    Date for PT Re-Evaluation 05/09/20    Authorization Type [employer]- [place] at visit 6  (vaso not covered)    PT Start Time 1830    PT Stop Time 1920    PT Time Calculation (min) 50 min    Activity Tolerance Patient tolerated treatment well    Behavior During Therapy [facility] for tasks assessed/performed           Past Medical History:  Diagnosis Date  . Anxiety   . Depression   . Elevated blood pressure   . GERD (gastroesophageal reflux disease)   . Hypertension   . Sleep apnea     Past Surgical History:  Procedure Laterality Date  . CESAREAN SECTION     at 28 weeks for pre-eclampsia, pulmonary edema, gestational DM.  Marland Kitchen dilationand curettage of Uterus  2007   SAB  . [id number]     Headache wellness consult. during pregnancy    There were no vitals filed for this visit.   Subjective Assessment - 04/14/20 1835    Subjective "I couldn't find my sweet spot when sleeping last night. It's hurting on my left side of my back today." Pt reports feeling good after previous session but reports that pain and tightness returned after a few days of relief.    How long can you walk comfortably? about 100 ft.    Patient Stated Goals decrease pain, sense of normalcy, washing dishes    Currently in Pain? Yes    Pain Score 7     Pain Location Back    Pain Orientation Left;Lower    Pain Descriptors / Indicators Aching;Dull              [facility] PT Assessment - 04/14/20 0001      Assessment   Medical Diagnosis chronic LBP    Referring Provider (PT) Magnus Sinning, MD       Observation/Other Assessments   Focus on Therapeutic Outcomes (FOTO)  62% limitation; predicted 50% limitation                         [facility] Adult PT Treatment/Exercise - 04/14/20 0001      Self-Care   Self-Care Other Self-Care Comments    Other Self-Care Comments  Discussed TPDN and provided handout for patient to consider for next session if interested. Reviewed HEP and discussed anatomy of condition using skeleton model.      Lumbar Exercises: Stretches   Lower Trunk Rotation Limitations small range in wide hooklying x 30 (15x each direction)      Lumbar Exercises: Aerobic   Nustep L4 x 6 min      Lumbar Exercises: Seated   Other Seated Lumbar Exercises Seated lateral flexion with green swiss ball 10 x 5 sec holds    Other Seated Lumbar Exercises Seated marches at EOM x 30      Lumbar Exercises: Supine   Pelvic Tilt 20 reps    Other Supine Lumbar Exercises Alternating marches x 30 while maintaining posterior pelvic tilt  Manual Therapy   Manual Therapy Soft tissue mobilization;Myofascial release;Manual Traction;Joint mobilization    Manual therapy comments manual traction to lumbar spine in prone over pillows    Joint Mobilization Lumbar AP facet joint mobilizations grades I and II for pain relief as tolerated    Soft tissue mobilization STM and myofascial release along L lower thoracic and lumbar paraspinals and L QL                  PT Education - 04/14/20 1927    Education Details Discussed TPDN and provided handout for patient to consider for next session if interested. Reviewed HEP and discussed anatomy of condition using skeleton model. Reviewed FOTO score and progression since intake score.    Person(s) Educated Patient    Methods Explanation;Demonstration;Tactile cues;Verbal cues;Handout    Comprehension Verbalized understanding;Returned demonstration;Verbal cues required;Tactile cues required            PT Short Term Goals - 04/07/20  1927      PT SHORT TERM GOAL #1   Title pt will present standing posture at midline    Baseline shifts to Lt at eval    Time 3    Period Weeks    Status On-going    Target Date 04/04/20             PT Long Term Goals - 03/12/20 1347      PT LONG TERM GOAL #1   Title bil hip MMT gross to 5/5 for necessary support    Baseline not appropriate to test at eval due to notable pelvic rotation    Time 8    Period Weeks    Status New    Target Date 05/09/20      PT LONG TERM GOAL #2   Title pt will be able to sleep without limitation by onset of pain    Baseline bil hip pain limits sleep at eval    Time 8    Period Weeks    Status New    Target Date 05/09/20      PT LONG TERM GOAL #3   Title pt will be able to ambulate for at least 30 min comfortably    Baseline limited to approx 13f at eval    Time 8    Period Weeks    Status New    Target Date 05/09/20      PT LONG TERM GOAL #4   Title pt will be independent with long term HEP in coordination with exercise for weight loss    Baseline would like to exercise for weight loss but is limited by pain at eval    Time 8    Period Weeks    Status New    Target Date 05/09/20                 Plan - 04/14/20 1902    Clinical Impression Statement Emphasis on manual therapy to ease significant TTP and aggravation along L lower thoracic and lumbar paraspinals and L QL that was slightly eased with DTM and IASTM. Pt experienced a sharp pain in lumbar spine when transitioning from prone to sitting that was eased with lumbar B facet AP joint mobilizations grades I and II. PT provided verbal explanation and handout with information regarding TPDN and potential benefits if pt is interested in trying it. Pt denies having a fear of needles "depending on what it's used for". Patient's FOTO score shows improvement from 66% limitation to 62%  limitation.    Personal Factors and Comorbidities Time since onset of  injury/illness/exacerbation;Comorbidity 2    Comorbidities h/o of c-section, past treatments with PT & chiropractic    Examination-Activity Limitations Reach Overhead;Bed Mobility;Sit;Bend;Sleep;Caring for Others;Carry;Squat;Stairs;Stand;Lift;Locomotion Level    Examination-Participation Restrictions Meal Prep;Occupation;Cleaning;Community Activity    Stability/Clinical Decision Making Evolving/Moderate complexity    Rehab Potential Good    PT Frequency 2x / week    PT Duration 6 weeks    PT Treatment/Interventions ADLs/Self Care Home Management;Cryotherapy;Electrical Stimulation;Gait training;Ultrasound;Traction;Moist Heat;Iontophoresis 102m/ml Dexamethasone;Stair training;Functional mobility training;Therapeutic activities;Therapeutic exercise;Neuromuscular re-education;Manual techniques;Patient/family education;Passive range of motion;Dry needling;Taping;Joint Manipulations;Spinal Manipulations    PT Next Visit Plan cont manual PRN, progress core/LE strengthening, TPDN if pt interested?, attempt hip hinge with dowel if tolerated    PT Home Exercise Plan [id number] - hesh- self correction of Lt post innom, seated/standing flexion stretch, equal posture    Consulted and Agree with Plan of Care Patient           Patient will benefit from skilled therapeutic intervention in order to improve the following deficits and impairments:  Difficulty walking, Increased muscle spasms, Decreased activity tolerance, Pain, Improper body mechanics, Decreased strength, Postural dysfunction  Visit Diagnosis: Chronic midline low back pain without sciatica  Abnormal posture  Difficulty in walking, not elsewhere classified     Problem List Patient Active Problem List   Diagnosis Date Noted  . Chronic diastolic CHF (congestive heart failure) ([facility] 08/08/2018  . Depression, major, single episode, mild ([facility] 08/08/2018  . Asthma exacerbation 12/28/2017  . Benign essential HTN 05/04/2016  . History of  pre-eclampsia 02/12/2015  . Abnormal vaginal Pap smear 02/05/2015  . Abortion, spontaneous 02/05/2015  . Anxiety 02/05/2015  . LBP (low back pain) 02/05/2015  . Fatigue 02/05/2015  . Fibrositis 02/05/2015  . Folliculitis 044/97/5300 . Acid reflux 02/05/2015  . History of diabetes mellitus arising in pregnancy 02/05/2015  . High risk sexual behavior 02/05/2015  . 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/2015  . Class 3 severe obesity due to excess calories with serious comorbidity and body mass index (BMI) of 60.0 to 69.9 in adult ([facility] 02/05/2015  . Obstructive sleep apnea 02/05/2015  . Vitamin D deficiency 02/05/2015  . Depression 02/05/2015  . Fibromyalgia 02/05/2015  . Abnormal C-reactive protein 10/24/2012  . Chronic low back pain 08/13/2012  . Absence of menstruation 09/22/2009  . Migraine without status migrainosus 07/22/2008   PHYSICAL THERAPY DISCHARGE SUMMARY  Visits from Start of Care: 6  Current functional level related to goals / functional outcomes: See above   Remaining deficits: See above   Education / Equipment: See above  Plan: Patient agrees to discharge.  Patient goals were partially met. Patient is being discharged due to not returning since the last visit.  ?????         KHaydee Monica PT, DPT 05/25/20 12:15 PM   [facility] [address]Phone: [phone]  Fax:  [phone] Name: AMYCA PERNOMRN: [id number] of Birth: [birth year]

## 2020-04-15 ENCOUNTER — Encounter: Payer: Self-pay | Admitting: Physician Assistant

## 2020-04-19 ENCOUNTER — Other Ambulatory Visit: Payer: Self-pay

## 2020-04-19 ENCOUNTER — Ambulatory Visit
Admission: RE | Admit: 2020-04-19 | Discharge: 2020-04-19 | Disposition: A | Discharge status: Home / Self Care | Payer: Managed Care, Other (non HMO) | Source: Ambulatory Visit | Attending: Physical Medicine and Rehabilitation | Admitting: Physical Medicine and Rehabilitation

## 2020-04-19 DIAGNOSIS — M545 Low back pain, unspecified: Secondary | ICD-10-CM

## 2020-04-19 DIAGNOSIS — M47816 Spondylosis without myelopathy or radiculopathy, lumbar region: Secondary | ICD-10-CM

## 2020-04-19 DIAGNOSIS — G8929 Other chronic pain: Secondary | ICD-10-CM

## 2020-04-20 ENCOUNTER — Ambulatory Visit: Payer: Managed Care, Other (non HMO) | Admitting: Physical Therapy

## 2020-04-20 ENCOUNTER — Telehealth: Payer: Self-pay | Admitting: Physical Therapy

## 2020-04-20 NOTE — Telephone Encounter (Signed)
Spoke with patient- did not see appointment on mychart or get reminder call. Will be at next appointment. [name] C. Savien Mamula PT, DPT 04/20/20 5:39 PM

## 2020-04-23 ENCOUNTER — Ambulatory Visit: Payer: Managed Care, Other (non HMO) | Admitting: Physical Therapy

## 2020-05-01 ENCOUNTER — Other Ambulatory Visit: Payer: Self-pay | Admitting: Physician Assistant

## 2020-05-01 NOTE — Addendum Note (Signed)
Addended by: Blenda Nicely on: 05/01/2020 08:49 AM   Modules accepted: Orders

## 2020-05-13 NOTE — Telephone Encounter (Signed)
consent

## 2020-05-14 ENCOUNTER — Ambulatory Visit: Payer: Managed Care, Other (non HMO) | Attending: Physical Medicine and Rehabilitation | Admitting: Physical Therapy

## 2020-05-14 ENCOUNTER — Telehealth: Payer: Self-pay | Admitting: Physical Therapy

## 2020-05-14 NOTE — Telephone Encounter (Signed)
LVM regarding NS today. Has no further appointments scheduled and it has been almost a month since we have seen her. Requested a call back with questions. Arnoldo Hildreth C. Laurence Crofford PT, DPT 05/14/20 6:19 PM

## 2020-07-06 ENCOUNTER — Ambulatory Visit: Payer: Self-pay | Admitting: Physician Assistant

## 2020-07-13 ENCOUNTER — Telehealth: Payer: Self-pay | Admitting: Physical Medicine and Rehabilitation

## 2020-07-13 ENCOUNTER — Ambulatory Visit (INDEPENDENT_AMBULATORY_CARE_PROVIDER_SITE_OTHER): Payer: Managed Care, Other (non HMO) | Admitting: Physician Assistant

## 2020-07-13 ENCOUNTER — Telehealth: Payer: Self-pay | Admitting: Physician Assistant

## 2020-07-13 ENCOUNTER — Other Ambulatory Visit: Payer: Self-pay

## 2020-07-13 ENCOUNTER — Encounter: Payer: Self-pay | Admitting: Physician Assistant

## 2020-07-13 VITALS — BP 145/99 | HR 99 | Temp 99.2°F | Ht 60.0 in | Wt 342.0 lb

## 2020-07-13 DIAGNOSIS — Z6841 Body Mass Index (BMI) 40.0 and over, adult: Secondary | ICD-10-CM | POA: Diagnosis not present

## 2020-07-13 DIAGNOSIS — Z3042 Encounter for surveillance of injectable contraceptive: Secondary | ICD-10-CM | POA: Diagnosis not present

## 2020-07-13 DIAGNOSIS — I5032 Chronic diastolic (congestive) heart failure: Secondary | ICD-10-CM

## 2020-07-13 MED ORDER — TORSEMIDE 20 MG PO TABS: 20.0000 mg | ORAL_TABLET | Freq: Every day | ORAL | 5 refills | Status: DC

## 2020-07-13 MED ORDER — MEDROXYPROGESTERONE ACETATE 150 MG/ML IM SUSP
150.0000 mg | Freq: Once | INTRAMUSCULAR | Status: AC
  Administered 2021-11-22: 150 mg via INTRAMUSCULAR

## 2020-07-13 NOTE — Telephone Encounter (Signed)
Called pt and r/s 2/23

## 2020-07-13 NOTE — Telephone Encounter (Signed)
Cancelled appointment and left message #1 to reschedule. 

## 2020-07-13 NOTE — Telephone Encounter (Signed)
Patient called to reschedule appt. Please call patient at [phone].

## 2020-07-13 NOTE — Progress Notes (Signed)
Established patient visit   Patient: [name]   DOB: [birth year]   35 y.o. Female  MRN: [id number] Visit Date: 07/13/2020  Today's healthcare provider: Margaretann Loveless, [name]   No chief complaint on file.  Subjective    HPI  Follow up for obesity  The patient was last seen for this 3 months ago. Changes made at last visit include stopping Saxenda secondary to possible side effects. Pt states she would like to try it again.  She is thinking the Torsemide is causing nausea and not Saxenda.   Reports she has not taken the torsemide over the last couple of days and has not had the nausea as bad. She is interested in decreasing to once daily and add the second on if she needs it.  -----------------------------------------------------------------------------------------    Patient Active Problem List   Diagnosis Date Noted  . Chronic diastolic CHF (congestive heart failure) (HCC) 08/08/2018  . Depression, major, single episode, mild (HCC) 08/08/2018  . Asthma exacerbation 12/28/2017  . Benign essential HTN 05/04/2016  . History of pre-eclampsia 02/12/2015  . Abnormal vaginal Pap smear 02/05/2015  . Abortion, spontaneous 02/05/2015  . Anxiety 02/05/2015  . LBP (low back pain) 02/05/2015  . Fatigue 02/05/2015  . Fibrositis 02/05/2015  . Folliculitis 02/05/2015  . Acid reflux 02/05/2015  . History of diabetes mellitus arising in pregnancy 02/05/2015  . High risk sexual behavior 02/05/2015  . 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/2015  . Class 3 severe obesity due to excess calories with serious comorbidity and body mass index (BMI) of 60.0 to 69.9 in adult (HCC) 02/05/2015  . Obstructive sleep apnea 02/05/2015  . Vitamin D deficiency 02/05/2015  . Depression 02/05/2015  . Fibromyalgia 02/05/2015  . Abnormal C-reactive protein 10/24/2012  . Chronic low back pain 08/13/2012  . Absence of menstruation 09/22/2009  . Migraine without status migrainosus 07/22/2008   Past Medical  History:  Diagnosis Date  . Anxiety   . Depression   . Elevated blood pressure   . GERD (gastroesophageal reflux disease)   . Hypertension   . Sleep apnea    Social History   Tobacco Use  . Smoking status: Current Some Day Smoker    Types: Cigars  . Smokeless tobacco: Never Used  . Tobacco comment: smokes cigars  Vaping Use  . Vaping Use: Never used  Substance Use Topics  . Alcohol use: Yes    Alcohol/week: 0.0 standard drinks    Comment: ocassional alcohol use; Mixed drinks once every 2-3 months  . Drug use: No   Allergies  Allergen Reactions  . Lactose Intolerance (Gi) Diarrhea  . Penicillins Hives    Has patient had a PCN reaction causing immediate rash, facial/tongue/throat swelling, SOB or lightheadedness with hypotension: Yes Has patient had a PCN reaction causing severe rash involving mucus membranes or skin necrosis: No Has patient had a PCN reaction that required hospitalization No Has patient had a PCN reaction occurring within the last 10 years: Yes If all of the above answers are "NO", then may proceed with Cephalosporin use.  Marland Kitchen Pineapple Other (See Comments)    Patient states this cuts my mouth and makes it bleed.     Medications: Outpatient Medications Prior to Visit  Medication Sig  . albuterol (VENTOLIN HFA) 108 (90 Base) MCG/ACT inhaler Inhale 2 puffs into the lungs every 6 (six) hours as needed for wheezing or shortness of breath.  Marland Kitchen FLUoxetine (PROZAC) 20 MG tablet Take 1 tablet (20 mg total) by  mouth daily.  . medroxyPROGESTERone (DEPO-PROVERA) 150 MG/ML injection Inject 150 mg into the muscle every 3 (three) months.  . meloxicam (MOBIC) 15 MG tablet Take 1 tablet (15 mg total) by mouth daily as needed for pain.  . metroNIDAZOLE (METROGEL) 0.75 % vaginal gel Place 1 Applicatorful vaginally 2 (two) times daily as needed.  . montelukast (SINGULAIR) 10 MG tablet Take 10 mg by mouth at bedtime as needed.  . nystatin cream (MYCOSTATIN) Apply 1 application  topically 2 (two) times daily as needed for dry skin.  Marland Kitchen omeprazole (PRILOSEC) 40 MG capsule Take 1 capsule (40 mg total) daily by mouth.  . ondansetron (ZOFRAN) 4 MG tablet Take 1 tablet (4 mg total) by mouth every 8 (eight) hours as needed.  . potassium chloride (KLOR-CON) 10 MEQ tablet Take 1 tablet (10 mEq total) by mouth daily.  . propranolol (INDERAL) 20 MG tablet Take 1 tablet (20 mg total) by mouth 3 (three) times daily as needed. For palpitations  . torsemide (DEMADEX) 20 MG tablet Take 2 tablets by mouth once daily  . Vitamin D, Ergocalciferol, (DRISDOL) 1.25 MG (50000 UNIT) CAPS capsule Take 1 capsule (50,000 Units total) by mouth every 7 (seven) days.  . Insulin Pen Needle (BD PEN NEEDLE NANO 2ND GEN) 32G X 4 MM MISC To use daily with saxenda injection (Patient not taking: No sig reported)  . Liraglutide -Weight Management (SAXENDA) 18 MG/3ML SOPN Inject 0.1 mLs (0.6 mg total) into the skin daily. Increase by 0.6mg  weekly until 3mg  daily achieved. (Patient not taking: No sig reported)   No facility-administered medications prior to visit.    Review of Systems  Constitutional: Positive for unexpected weight change (Some weight gain. Pt has not taking her Torsemide in two days secondary to side effects.). Negative for activity change, appetite change, chills, diaphoresis, fatigue and fever.  Respiratory: Negative.   Cardiovascular: Negative.   Gastrointestinal: Negative.   Neurological: Negative.         Objective    BP (!) 176/104 (BP Location: Left Wrist, Patient Position: Sitting, Cuff Size: Normal)   Pulse 97   Temp 99.2 F (37.3 C) (Oral)   Ht 5' (1.524 m)   Wt (!) 342 lb (155.1 kg)   BMI 66.79 kg/m    Physical Exam Vitals reviewed.  Constitutional:      General: She is not in acute distress.    Appearance: Normal appearance. She is well-developed and well-nourished. She is obese. She is not ill-appearing.  HENT:     Head: Normocephalic and atraumatic.  Eyes:      Extraocular Movements: EOM normal.  Pulmonary:     Effort: Pulmonary effort is normal. No respiratory distress.  Musculoskeletal:     Cervical back: Normal range of motion and neck supple.  Neurological:     Mental Status: She is alert.  Psychiatric:        Mood and Affect: Mood and affect and mood normal.        Behavior: Behavior normal.        Thought Content: Thought content normal.        Judgment: Judgment normal.      No results found for any visits on 07/13/20.  Assessment & Plan     1. Chronic diastolic CHF (congestive heart failure) (HCC) Decrease torsemide to once daily and add the 2nd tab if weight is up 2-3 pounds overnight.  - torsemide (DEMADEX) 20 MG tablet; Take 1 tablet (20 mg total) by mouth daily. May  add second pill if needed  Dispense: 60 tablet; Refill: 5  2. Class 3 severe obesity due to excess calories with serious comorbidity and body mass index (BMI) of 60.0 to 69.9 in adult [facility]) Will restart Saxenda and see if she is tolerating well. Titrate slowly. F/U in 3 months.   3. Encounter for surveillance of injectable contraceptive Depo-provera given today without issue. F/U in 3 months.  - medroxyPROGESTERone (DEPO-PROVERA) injection 150 mg   No follow-ups on file.      Delmer Islam, [name], have reviewed all documentation for this visit. The documentation on 07/13/20 for the exam, diagnosis, procedures, and orders are all accurate and complete.   Reine Just  [facility] [phone] (phone) [phone] (fax)  [facility]

## 2020-07-13 NOTE — Telephone Encounter (Signed)
Pt called stating she needs to reschedule her appt on 07/14/20  [phone]

## 2020-07-14 ENCOUNTER — Ambulatory Visit: Payer: Managed Care, Other (non HMO) | Admitting: Physical Medicine and Rehabilitation

## 2020-07-15 ENCOUNTER — Encounter: Payer: Self-pay | Admitting: Physical Medicine and Rehabilitation

## 2020-07-15 ENCOUNTER — Ambulatory Visit (INDEPENDENT_AMBULATORY_CARE_PROVIDER_SITE_OTHER): Payer: Managed Care, Other (non HMO) | Admitting: Physical Medicine and Rehabilitation

## 2020-07-15 ENCOUNTER — Telehealth: Payer: Self-pay | Admitting: Physical Medicine and Rehabilitation

## 2020-07-15 ENCOUNTER — Other Ambulatory Visit: Payer: Self-pay

## 2020-07-15 VITALS — BP 141/82 | HR 94

## 2020-07-15 DIAGNOSIS — M545 Low back pain, unspecified: Secondary | ICD-10-CM | POA: Diagnosis not present

## 2020-07-15 DIAGNOSIS — M47816 Spondylosis without myelopathy or radiculopathy, lumbar region: Secondary | ICD-10-CM

## 2020-07-15 DIAGNOSIS — M5136 Other intervertebral disc degeneration, lumbar region: Secondary | ICD-10-CM

## 2020-07-15 DIAGNOSIS — M797 Fibromyalgia: Secondary | ICD-10-CM | POA: Diagnosis not present

## 2020-07-15 DIAGNOSIS — G8929 Other chronic pain: Secondary | ICD-10-CM

## 2020-07-15 NOTE — Telephone Encounter (Signed)
Needs auth for bilateral L4-5 facets. Scheduled for 3/2.

## 2020-07-15 NOTE — Progress Notes (Signed)
Here for MRI review. Pain in center of low back. Has noticed some burning and tingling on bottom of left foot. Pain with sitting and standing. Pain is constant throughout the day. Numeric Pain Rating Scale and Functional Assessment Average Pain 8   In the last MONTH (on 0-10 scale) has pain interfered with the following?  1. General activity like being  able to carry out your everyday physical activities such as walking, climbing stairs, carrying groceries, or moving a chair?  Rating(9)

## 2020-07-16 ENCOUNTER — Encounter: Payer: Self-pay | Admitting: Physical Medicine and Rehabilitation

## 2020-07-16 NOTE — Progress Notes (Signed)
[name] - 35 y.o. female MRN [id number]  Date of birth: 04/02/1986  Office Visit Note: Visit Date: 07/15/2020 PCP: Margaretann Loveless, [name] Referred by: Suella Grove*  Subjective: Chief Complaint  Patient presents with  . Lower Back - Pain   HPI: [name] is a 35 y.o. female who comes in today For evaluation management of chronic worsening low back pain.  She describes 8 out of 10 back pain at the center of the lower back with worse pain with standing and walking throughout the day but sometimes worse in the morning with sitting.  If she has been standing and moving and increasing pain she will get some relief initially with sitting.  She has no radicular complaints down the legs or focal weakness.  No history of trauma no history of red flag complaints.  Prior consultation can be reviewed for full details.  Originally referred by her primary care provider [name], [name].  Patient's history is complicated by morbid obesity as well as heart failure and anxiety.  She does have a history of migraine headache.  In the past she has seen multiple practitioners for pain relief including Dr. Claria Dice and Dr. Chaney Malling and medical pain management with Dr. Adah Perl.  She has not had any interventions of the lumbar spine including injections or surgery.  When we saw her we obtained MRI of the lumbar spine.  She wanted to have this done in an open MRI and this was scheduled in [place].  It took a while to get that completed but we do have the report I just do not have the pictures.  The report will eventually get scanned into the chart but basically shows normal-appearing lumbar spine except for L4-5 where there is degenerative disc changes and disc height loss and early arthritic changes of the bilateral L4-5 facet joints.  There is no focal nerve compression or high-grade stenosis.  No other concerning features.  She does endorse some tingling and burning in the bottom of the  left foot and she is unsure if that is related.  No history of Morton's neuroma etc.  Review of Systems  Musculoskeletal: Positive for back pain.  Neurological: Positive for tingling.  All other systems reviewed and are negative.  Otherwise per HPI.  Assessment & Plan: Visit Diagnoses:    ICD-10-CM   1. Spondylosis without myelopathy or radiculopathy, lumbar region  M47.816   2. Chronic bilateral low back pain without sciatica  M54.50    G89.29   3. Fibromyalgia  M79.7   4. Other intervertebral disc degeneration, lumbar region  M51.36      Plan: Findings:  1.  Chronic worsening severe low back pain now with MRI evidence of degenerative changes at L4-5 including facet arthropathy and degenerative disc changes but without central stenosis or nerve compression or disc herniation.  She has been to multiple practitioners in the past for pain relief without really good results.  Her case is complicated by morbid obesity which she is trying to work on.  She has been in physical therapy since we have seen her last and she continues with that at [facility] physical therapy on [address].  At this point I think bilateral facet joint blocks would likely help her and would be diagnostic and hopefully therapeutic.  Could consider possibility of radiofrequency ablation, although I am not sure if the cannulas would be the right size.  If this was not beneficial we will try  one-time epidural injection.  Should continue with back exercises and weight loss management.  2.  Tingling in the bottom of the left foot seems to be unrelated to the lumbar spine according to the report were looking at although we cannot totally rule that out.  She may wish to talk to her primary care provider about referral to podiatry or potential we could refer her here to Dr. Lajoyce Corners.    Meds & Orders: No orders of the defined types were placed in this encounter.  No orders of the defined types were placed in this encounter.    Follow-up: Return for Bilateral L4-5 facet joint blocks.   Procedures: No procedures performed      Clinical History: No specialty comments available.   She reports that she has been smoking cigars. She has never used smokeless tobacco. No results for input(s): HGBA1C, LABURIC in the last 8760 hours.  Objective:  VS:  HT:    WT:   BMI:     BP:(!) 141/82  HR:94bpm  TEMP: ( )  RESP:  Physical Exam Vitals and nursing note reviewed.  Constitutional:      General: She is not in acute distress.    Appearance: Normal appearance. She is obese. She is not ill-appearing.  HENT:     Head: Normocephalic and atraumatic.     Right Ear: External ear normal.     Left Ear: External ear normal.  Eyes:     Extraocular Movements: Extraocular movements intact.  Cardiovascular:     Rate and Rhythm: Normal rate.     Pulses: Normal pulses.  Pulmonary:     Effort: Pulmonary effort is normal. No respiratory distress.  Abdominal:     General: There is no distension.     Palpations: Abdomen is soft.  Musculoskeletal:        General: Tenderness present.     Cervical back: Neck supple.     Right lower leg: No edema.     Left lower leg: No edema.     Comments: Patient has good distal strength with no pain over the greater trochanters.  No clonus or focal weakness. Patient somewhat slow to rise from a seated position to full extension.  There is concordant low back pain with facet loading and lumbar spine extension rotation.  There are no definitive trigger points but the patient is somewhat tender across the lower back and PSIS.  There is no pain with hip rotation.   Skin:    Findings: No erythema, lesion or rash.  Neurological:     General: No focal deficit present.     Mental Status: She is alert and oriented to person, place, and time.     Sensory: No sensory deficit.     Motor: No weakness or abnormal muscle tone.     Coordination: Coordination normal.  Psychiatric:        Mood and  Affect: Mood normal.        Behavior: Behavior normal.     Ortho Exam  Imaging: No results found.  Past Medical/Family/Surgical/Social History: Medications & Allergies reviewed per EMR, new medications updated. Patient Active Problem List   Diagnosis Date Noted  . Chronic diastolic CHF (congestive heart failure) (HCC) 08/08/2018  . Depression, major, single episode, mild (HCC) 08/08/2018  . Asthma exacerbation 12/28/2017  . Benign essential HTN 05/04/2016  . History of pre-eclampsia 02/12/2015  . Abnormal vaginal Pap smear 02/05/2015  . Abortion, spontaneous 02/05/2015  . Anxiety 02/05/2015  . LBP (  low back pain) 02/05/2015  . Fatigue 02/05/2015  . Fibrositis 02/05/2015  . Folliculitis 02/05/2015  . Acid reflux 02/05/2015  . History of diabetes mellitus arising in pregnancy 02/05/2015  . High risk sexual behavior 02/05/2015  . 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/2015  . Class 3 severe obesity due to excess calories with serious comorbidity and body mass index (BMI) of 60.0 to 69.9 in adult (HCC) 02/05/2015  . Obstructive sleep apnea 02/05/2015  . Vitamin D deficiency 02/05/2015  . Depression 02/05/2015  . Fibromyalgia 02/05/2015  . Abnormal C-reactive protein 10/24/2012  . Chronic low back pain 08/13/2012  . Absence of menstruation 09/22/2009  . Migraine without status migrainosus 07/22/2008   Past Medical History:  Diagnosis Date  . Anxiety   . Depression   . Elevated blood pressure   . GERD (gastroesophageal reflux disease)   . Hypertension   . Sleep apnea    Family History  Problem Relation Age of Onset  . Hypertension Maternal Grandmother    Past Surgical History:  Procedure Laterality Date  . CESAREAN SECTION     at 28 weeks for pre-eclampsia, pulmonary edema, gestational DM.  Marland Kitchen dilationand curettage of Uterus  2007   SAB  . Headache:2009     Headache wellness consult. during pregnancy   Social History   Occupational History  . Not on file  Tobacco Use  .  Smoking status: Current Some Day Smoker    Types: Cigars  . Smokeless tobacco: Never Used  . Tobacco comment: smokes cigars  Vaping Use  . Vaping Use: Never used  Substance and Sexual Activity  . Alcohol use: Yes    Alcohol/week: 0.0 standard drinks    Comment: ocassional alcohol use; Mixed drinks once every 2-3 months  . Drug use: No  . Sexual activity: Yes

## 2020-07-16 NOTE — Telephone Encounter (Signed)
Pt insurance is PENDING. 

## 2020-07-20 ENCOUNTER — Other Ambulatory Visit: Payer: Self-pay | Admitting: Physical Medicine and Rehabilitation

## 2020-07-20 DIAGNOSIS — F411 Generalized anxiety disorder: Secondary | ICD-10-CM

## 2020-07-20 MED ORDER — DIAZEPAM 5 MG PO TABS: ORAL_TABLET | ORAL | 0 refills | Status: DC

## 2020-07-20 NOTE — Telephone Encounter (Signed)
Pt was approve Auth# [id number]

## 2020-07-20 NOTE — Progress Notes (Signed)
Pre-procedure diazepam ordered for pre-operative anxiety.  

## 2020-07-23 ENCOUNTER — Ambulatory Visit (INDEPENDENT_AMBULATORY_CARE_PROVIDER_SITE_OTHER): Payer: Managed Care, Other (non HMO) | Admitting: Physical Medicine and Rehabilitation

## 2020-07-23 ENCOUNTER — Other Ambulatory Visit: Payer: Self-pay

## 2020-07-23 ENCOUNTER — Ambulatory Visit: Payer: Self-pay

## 2020-07-23 ENCOUNTER — Encounter: Payer: Self-pay | Admitting: Physical Medicine and Rehabilitation

## 2020-07-23 VITALS — BP 143/87 | HR 102

## 2020-07-23 DIAGNOSIS — M47816 Spondylosis without myelopathy or radiculopathy, lumbar region: Secondary | ICD-10-CM

## 2020-07-23 MED ORDER — BETAMETHASONE SOD PHOS & ACET 6 (3-3) MG/ML IJ SUSP
12.0000 mg | Freq: Once | INTRAMUSCULAR | Status: AC
  Administered 2020-07-23: 12 mg

## 2020-07-23 NOTE — Patient Instructions (Signed)

## 2020-07-23 NOTE — Progress Notes (Signed)
Pt state lower back pain. Pt state walking, standing and sitting makes the pain worse. Pt state she take pain meds and use heating pads to help ease the pain.  Numeric Pain Rating Scale and Functional Assessment Average Pain 9   In the last MONTH (on 0-10 scale) has pain interfered with the following?  1. General activity like being  able to carry out your everyday physical activities such as walking, climbing stairs, carrying groceries, or moving a chair?  Rating(10)   +Driver, -BT, -Dye Allergies.

## 2020-08-06 ENCOUNTER — Telehealth: Payer: Self-pay

## 2020-08-06 NOTE — Telephone Encounter (Signed)
Pt advised FMLA forms were faxed today and copies of the FMLA forms (3 sets; Asthma & Allergies, Back Pain & Migraine, Anxiety & Depression) were placed up front for pt to pick up as requested. Pt verbalized understanding. TNP

## 2020-08-25 NOTE — Progress Notes (Signed)
[name] - 35 y.o. female MRN [id number]  Date of birth: 1986-03-12  Office Visit Note: Visit Date: 07/23/2020 PCP: Margaretann Loveless, [name] Referred by: Suella Grove*  Subjective: Chief Complaint  Patient presents with  . Lower Back - Pain   HPI:  [name] is a 34 y.o. female who comes in today  for planned Bilateral  L4-L5 Lumbar facet/medial branch block with fluoroscopic guidance.  The patient has failed conservative care including home exercise, medications, time and activity modification.  This injection will be diagnostic and hopefully therapeutic.  Please see requesting physician notes for further details and justification.  Exam has shown concordant pain with facet joint loading.   ROS Otherwise per HPI.  Assessment & Plan: Visit Diagnoses:    ICD-10-CM   1. Spondylosis without myelopathy or radiculopathy, lumbar region  M47.816 XR C-ARM NO REPORT    Facet Injection    betamethasone acetate-betamethasone sodium phosphate (CELESTONE) injection 12 mg    Plan: No additional findings.   Meds & Orders:  Meds ordered this encounter  Medications  . betamethasone acetate-betamethasone sodium phosphate (CELESTONE) injection 12 mg    Orders Placed This Encounter  Procedures  . Facet Injection  . XR C-ARM NO REPORT    Follow-up: Return for Review Pain Diary.   Procedures: No procedures performed  Lumbar Diagnostic Facet Joint Nerve Block with Fluoroscopic Guidance   Patient: [name]      Date of Birth: [birth year] MRN: [id number] PCP: Margaretann Loveless, [name]      Visit Date: 07/23/2020   Universal Protocol:    Date/Time: 04/05/225:41 AM  Consent Given By: the patient  Position: PRONE  Additional Comments: Vital signs were monitored before and after the procedure. Patient was prepped and draped in the usual sterile fashion. The correct patient, procedure, and site was verified.   Injection Procedure Details:   Procedure  diagnoses:  1. Spondylosis without myelopathy or radiculopathy, lumbar region      Meds Administered:  Meds ordered this encounter  Medications  . betamethasone acetate-betamethasone sodium phosphate (CELESTONE) injection 12 mg     Laterality: Bilateral  Location/Site: L4-L5, L3 and L4 medial branches  Needle: 5.0 in., 25 ga.  Short bevel or Quincke spinal needle  Needle Placement: Oblique pedical  Findings:   -Comments: There was excellent flow of contrast along the articular pillars without intravascular flow.  Procedure Details: The fluoroscope beam is vertically oriented in AP and then obliqued 15 to 20 degrees to the ipsilateral side of the desired nerve to achieve the "Scotty dog" appearance.  The skin over the target area of the junction of the superior articulating process and the transverse process (sacral ala if blocking the L5 dorsal rami) was locally anesthetized with a 1 ml volume of 1% Lidocaine without Epinephrine.  The spinal needle was inserted and advanced in a trajectory view down to the target.   After contact with periosteum and negative aspirate for blood and CSF, correct placement without intravascular or epidural spread was confirmed by injecting 0.5 ml. of Isovue-250.  A spot radiograph was obtained of this image.    Next, a 0.5 ml. volume of the injectate described above was injected. The needle was then redirected to the other facet joint nerves mentioned above if needed.  Prior to the procedure, the patient was given a Pain Diary which was completed for baseline measurements.  After the procedure, the patient rated their pain every 30 minutes and  will continue rating at this frequency for a total of 5 hours.  The patient has been asked to complete the Diary and return to [place] by mail, fax or hand delivered as soon as possible.   Additional Comments:  The patient tolerated the procedure well Dressing: 2 x 2 sterile gauze and Band-Aid    Post-procedure  details: Patient was observed during the procedure. Post-procedure instructions were reviewed.  Patient left the clinic in stable condition.     Clinical History: No specialty comments available.     Objective:  VS:  HT:    WT:   BMI:     BP:(!) 143/87  HR:(!) 102bpm  TEMP: ( )  RESP:  Physical Exam Vitals and nursing note reviewed.  Constitutional:      General: She is not in acute distress.    Appearance: Normal appearance. She is not ill-appearing.  HENT:     Head: Normocephalic and atraumatic.     Right Ear: External ear normal.     Left Ear: External ear normal.  Eyes:     Extraocular Movements: Extraocular movements intact.  Cardiovascular:     Rate and Rhythm: Normal rate.     Pulses: Normal pulses.  Pulmonary:     Effort: Pulmonary effort is normal. No respiratory distress.  Abdominal:     General: There is no distension.     Palpations: Abdomen is soft.  Musculoskeletal:        General: Tenderness present.     Cervical back: Neck supple.     Right lower leg: No edema.     Left lower leg: No edema.     Comments: Patient has good distal strength with no pain over the greater trochanters.  No clonus or focal weakness. Patient somewhat slow to rise from a seated position to full extension.  There is concordant low back pain with facet loading and lumbar spine extension rotation.  There are no definitive trigger points but the patient is somewhat tender across the lower back and PSIS.  There is no pain with hip rotation.   Skin:    Findings: No erythema, lesion or rash.  Neurological:     General: No focal deficit present.     Mental Status: She is alert and oriented to person, place, and time.     Sensory: No sensory deficit.     Motor: No weakness or abnormal muscle tone.     Coordination: Coordination normal.  Psychiatric:        Mood and Affect: Mood normal.        Behavior: Behavior normal.      Imaging: No results found.

## 2020-08-25 NOTE — Procedures (Signed)
Lumbar Diagnostic Facet Joint Nerve Block with Fluoroscopic Guidance   Patient: [name]      Date of Birth: January 28, 1986 MRN: [id number] PCP: Margaretann Loveless, [name]      Visit Date: 07/23/2020   Universal Protocol:    Date/Time: 04/05/225:41 AM  Consent Given By: the patient  Position: PRONE  Additional Comments: Vital signs were monitored before and after the procedure. Patient was prepped and draped in the usual sterile fashion. The correct patient, procedure, and site was verified.   Injection Procedure Details:   Procedure diagnoses:  1. Spondylosis without myelopathy or radiculopathy, lumbar region      Meds Administered:  Meds ordered this encounter  Medications  . betamethasone acetate-betamethasone sodium phosphate (CELESTONE) injection 12 mg     Laterality: Bilateral  Location/Site: L4-L5, L3 and L4 medial branches  Needle: 5.0 in., 25 ga.  Short bevel or Quincke spinal needle  Needle Placement: Oblique pedical  Findings:   -Comments: There was excellent flow of contrast along the articular pillars without intravascular flow.  Procedure Details: The fluoroscope beam is vertically oriented in AP and then obliqued 15 to 20 degrees to the ipsilateral side of the desired nerve to achieve the "Scotty dog" appearance.  The skin over the target area of the junction of the superior articulating process and the transverse process (sacral ala if blocking the L5 dorsal rami) was locally anesthetized with a 1 ml volume of 1% Lidocaine without Epinephrine.  The spinal needle was inserted and advanced in a trajectory view down to the target.   After contact with periosteum and negative aspirate for blood and CSF, correct placement without intravascular or epidural spread was confirmed by injecting 0.5 ml. of Isovue-250.  A spot radiograph was obtained of this image.    Next, a 0.5 ml. volume of the injectate described above was injected. The needle was then  redirected to the other facet joint nerves mentioned above if needed.  Prior to the procedure, the patient was given a Pain Diary which was completed for baseline measurements.  After the procedure, the patient rated their pain every 30 minutes and will continue rating at this frequency for a total of 5 hours.  The patient has been asked to complete the Diary and return to [place] by mail, fax or hand delivered as soon as possible.   Additional Comments:  The patient tolerated the procedure well Dressing: 2 x 2 sterile gauze and Band-Aid    Post-procedure details: Patient was observed during the procedure. Post-procedure instructions were reviewed.  Patient left the clinic in stable condition.

## 2020-08-28 ENCOUNTER — Encounter: Payer: Self-pay | Admitting: Physical Medicine and Rehabilitation

## 2020-09-16 ENCOUNTER — Telehealth: Payer: Self-pay | Admitting: Physical Medicine and Rehabilitation

## 2020-09-16 NOTE — Telephone Encounter (Signed)
Patient called. She would like an appointment with Dr. Alvester Morin. Her call back number is [phone]

## 2020-09-16 NOTE — Telephone Encounter (Signed)
Okay for pt #2 MBB bil L4-L5, L3 and L4 medial branches on 07/23/20

## 2020-09-18 NOTE — Telephone Encounter (Signed)
Called pt and sch 5/12 

## 2020-09-18 NOTE — Telephone Encounter (Signed)
Yes if it helped, even short term.

## 2020-09-30 ENCOUNTER — Other Ambulatory Visit: Payer: Self-pay

## 2020-09-30 ENCOUNTER — Ambulatory Visit (INDEPENDENT_AMBULATORY_CARE_PROVIDER_SITE_OTHER): Payer: Managed Care, Other (non HMO) | Admitting: Family Medicine

## 2020-09-30 ENCOUNTER — Ambulatory Visit: Payer: Managed Care, Other (non HMO) | Admitting: Adult Health

## 2020-09-30 DIAGNOSIS — Z3042 Encounter for surveillance of injectable contraceptive: Secondary | ICD-10-CM

## 2020-09-30 MED ORDER — MEDROXYPROGESTERONE ACETATE 150 MG/ML IM SUSP
150.0000 mg | Freq: Once | INTRAMUSCULAR | Status: AC
  Administered 2020-09-30: 150 mg via INTRAMUSCULAR

## 2020-09-30 NOTE — Progress Notes (Signed)
Nurse visit only. Administered Depo Provera injection. 25g 1.5 inch needle. Patient tolerated injection without difficulty.  Patient should return for next dose between 12/16/2020 through 12/30/2020.

## 2020-10-01 ENCOUNTER — Encounter: Payer: Self-pay | Admitting: Physical Medicine and Rehabilitation

## 2020-10-01 ENCOUNTER — Other Ambulatory Visit: Payer: Self-pay

## 2020-10-01 ENCOUNTER — Ambulatory Visit (INDEPENDENT_AMBULATORY_CARE_PROVIDER_SITE_OTHER): Payer: Managed Care, Other (non HMO) | Admitting: Physical Medicine and Rehabilitation

## 2020-10-01 ENCOUNTER — Ambulatory Visit: Payer: Self-pay

## 2020-10-01 VITALS — BP 145/75 | HR 41

## 2020-10-01 DIAGNOSIS — M47816 Spondylosis without myelopathy or radiculopathy, lumbar region: Secondary | ICD-10-CM

## 2020-10-01 MED ORDER — BUPIVACAINE HCL 0.5 % IJ SOLN
3.0000 mL | Freq: Once | INTRAMUSCULAR | Status: AC
Start: 2020-10-01 — End: ?

## 2020-10-01 MED ORDER — BUPIVACAINE HCL 0.5 % IJ SOLN: 3.0000 mL | Freq: Once | INTRAMUSCULAR | Status: DC

## 2020-10-01 NOTE — Patient Instructions (Signed)

## 2020-10-01 NOTE — Procedures (Signed)
Lumbar Diagnostic Facet Joint Nerve Block with Fluoroscopic Guidance   Patient: [name]      Date of Birth: [birth year] MRN: [id number] PCP: Margaretann Loveless, [name]      Visit Date: 10/01/2020   Universal Protocol:    Date/Time: 05/12/229:51 AM  Consent Given By: the patient  Position: PRONE  Additional Comments: Vital signs were monitored before and after the procedure. Patient was prepped and draped in the usual sterile fashion. The correct patient, procedure, and site was verified.   Injection Procedure Details:   Procedure diagnoses:  1. Spondylosis without myelopathy or radiculopathy, lumbar region      Meds Administered:  Meds ordered this encounter  Medications  . DISCONTD: bupivacaine (MARCAINE) 0.5 % (with pres) injection 3 mL  . bupivacaine (MARCAINE) 0.5 % (with pres) injection 3 mL     Laterality: Bilateral  Location/Site: L4-L5, L3 and L4 medial branches  Needle: 6.0 in., 22 ga.  Short bevel or Quincke spinal needle  Needle Placement: Oblique pedical  Findings:   -Comments: There was excellent flow of contrast along the articular pillars without intravascular flow.  Procedure Details: The fluoroscope beam is vertically oriented in AP and then obliqued 15 to 20 degrees to the ipsilateral side of the desired nerve to achieve the "Scotty dog" appearance.  The skin over the target area of the junction of the superior articulating process and the transverse process (sacral ala if blocking the L5 dorsal rami) was locally anesthetized with a 1 ml volume of 1% Lidocaine without Epinephrine.  The spinal needle was inserted and advanced in a trajectory view down to the target.   After contact with periosteum and negative aspirate for blood and CSF, correct placement without intravascular or epidural spread was confirmed by injecting 0.5 ml. of Isovue-250.  A spot radiograph was obtained of this image.    Next, a 0.5 ml. volume of the injectate described  above was injected. The needle was then redirected to the other facet joint nerves mentioned above if needed.  Prior to the procedure, the patient was given a Pain Diary which was completed for baseline measurements.  After the procedure, the patient rated their pain every 30 minutes and will continue rating at this frequency for a total of 5 hours.  The patient has been asked to complete the Diary and return to [place] by mail, fax or hand delivered as soon as possible.   Additional Comments:  The patient tolerated the procedure well Dressing: 2 x 2 sterile gauze and Band-Aid    Post-procedure details: Patient was observed during the procedure. Post-procedure instructions were reviewed.  Patient left the clinic in stable condition.

## 2020-10-01 NOTE — Progress Notes (Signed)
Pt state lower back pain. Pt state walking, standing and sitting makes the pain worse. Pt state she takes over the counter pain meds. Pt has hx of inj on 07/23/20 pt state it helped for a week.  Numeric Pain Rating Scale and Functional Assessment Average Pain 8   In the last MONTH (on 0-10 scale) has pain interfered with the following?  1. General activity like being  able to carry out your everyday physical activities such as walking, climbing stairs, carrying groceries, or moving a chair?  Rating(9)   +Driver, -BT, -Dye Allergies.

## 2020-10-01 NOTE — Progress Notes (Signed)
[name] - 35 y.o. female MRN [id number]  Date of birth: 12-10-1985  Office Visit Note: Visit Date: 10/01/2020 PCP: Margaretann Loveless, [name] Referred by: Suella Grove*  Subjective: Chief Complaint  Patient presents with  . Lower Back - Pain   HPI:  [name] is a 35 y.o. female who comes in today for planned Bilateral  L4-L5 Lumbar facet/medial branch block with fluoroscopic guidance.  The patient has failed conservative care including home exercise, medications, time and activity modification.  This injection will be diagnostic and hopefully therapeutic.  Please see requesting physician notes for further details and justification.  Exam has shown concordant pain with facet joint loading. MRI reviewed with images and spine model.  MRI reviewed in the note below.  This will be a repeat medial branch block at L4-5 with prior injection helping more than 70% with her axial back pain.  This will be a second injection and double block paradigm.  She meets all the criteria for radiofrequency ablation if she does well with this diagnostic block.    ROS Otherwise per HPI.  Assessment & Plan: Visit Diagnoses:    ICD-10-CM   1. Spondylosis without myelopathy or radiculopathy, lumbar region  M47.816 XR C-ARM NO REPORT    Facet Injection    bupivacaine (MARCAINE) 0.5 % (with pres) injection 3 mL    DISCONTINUED: bupivacaine (MARCAINE) 0.5 % (with pres) injection 3 mL    Plan: No additional findings.   Meds & Orders:  Meds ordered this encounter  Medications  . DISCONTD: bupivacaine (MARCAINE) 0.5 % (with pres) injection 3 mL  . bupivacaine (MARCAINE) 0.5 % (with pres) injection 3 mL    Orders Placed This Encounter  Procedures  . Facet Injection  . XR C-ARM NO REPORT    Follow-up: Return for Review Pain Diary.   Procedures: No procedures performed  Lumbar Diagnostic Facet Joint Nerve Block with Fluoroscopic Guidance   Patient: [name]      Date of  Birth: 03/03/1986 MRN: [id number] PCP: Margaretann Loveless, [name]      Visit Date: 10/01/2020   Universal Protocol:    Date/Time: 05/12/229:51 AM  Consent Given By: the patient  Position: PRONE  Additional Comments: Vital signs were monitored before and after the procedure. Patient was prepped and draped in the usual sterile fashion. The correct patient, procedure, and site was verified.   Injection Procedure Details:   Procedure diagnoses:  1. Spondylosis without myelopathy or radiculopathy, lumbar region      Meds Administered:  Meds ordered this encounter  Medications  . DISCONTD: bupivacaine (MARCAINE) 0.5 % (with pres) injection 3 mL  . bupivacaine (MARCAINE) 0.5 % (with pres) injection 3 mL     Laterality: Bilateral  Location/Site: L4-L5, L3 and L4 medial branches  Needle: 6.0 in., 22 ga.  Short bevel or Quincke spinal needle  Needle Placement: Oblique pedical  Findings:   -Comments: There was excellent flow of contrast along the articular pillars without intravascular flow.  Procedure Details: The fluoroscope beam is vertically oriented in AP and then obliqued 15 to 20 degrees to the ipsilateral side of the desired nerve to achieve the "Scotty dog" appearance.  The skin over the target area of the junction of the superior articulating process and the transverse process (sacral ala if blocking the L5 dorsal rami) was locally anesthetized with a 1 ml volume of 1% Lidocaine without Epinephrine.  The spinal needle was inserted and advanced in  a trajectory view down to the target.   After contact with periosteum and negative aspirate for blood and CSF, correct placement without intravascular or epidural spread was confirmed by injecting 0.5 ml. of Isovue-250.  A spot radiograph was obtained of this image.    Next, a 0.5 ml. volume of the injectate described above was injected. The needle was then redirected to the other facet joint nerves mentioned above if  needed.  Prior to the procedure, the patient was given a Pain Diary which was completed for baseline measurements.  After the procedure, the patient rated their pain every 30 minutes and will continue rating at this frequency for a total of 5 hours.  The patient has been asked to complete the Diary and return to [place] by mail, fax or hand delivered as soon as possible.   Additional Comments:  The patient tolerated the procedure well Dressing: 2 x 2 sterile gauze and Band-Aid    Post-procedure details: Patient was observed during the procedure. Post-procedure instructions were reviewed.  Patient left the clinic in stable condition.     Clinical History: No specialty comments available.     Objective:  VS:  HT:    WT:   BMI:     BP:(!) 145/75  HR:(!) 41bpm  TEMP: ( )  RESP:  Physical Exam Vitals and nursing note reviewed.  Constitutional:      General: She is not in acute distress.    Appearance: Normal appearance. She is obese. She is not ill-appearing.  HENT:     Head: Normocephalic and atraumatic.     Right Ear: External ear normal.     Left Ear: External ear normal.  Eyes:     Extraocular Movements: Extraocular movements intact.  Cardiovascular:     Rate and Rhythm: Normal rate.     Pulses: Normal pulses.  Pulmonary:     Effort: Pulmonary effort is normal. No respiratory distress.  Abdominal:     General: There is no distension.     Palpations: Abdomen is soft.  Musculoskeletal:        General: Tenderness present.     Cervical back: Neck supple.     Right lower leg: No edema.     Left lower leg: No edema.     Comments: Patient has good distal strength with no pain over the greater trochanters.  No clonus or focal weakness. Patient somewhat slow to rise from a seated position to full extension.  There is concordant low back pain with facet loading and lumbar spine extension rotation.  There are no definitive trigger points but the patient is somewhat tender across  the lower back and PSIS.  There is no pain with hip rotation.   Skin:    Findings: No erythema, lesion or rash.  Neurological:     General: No focal deficit present.     Mental Status: She is alert and oriented to person, place, and time.     Sensory: No sensory deficit.     Motor: No weakness or abnormal muscle tone.     Coordination: Coordination normal.  Psychiatric:        Mood and Affect: Mood normal.        Behavior: Behavior normal.      Imaging: XR C-ARM NO REPORT  Result Date: 10/01/2020 Please see Notes tab for imaging impression.

## 2020-10-02 NOTE — Progress Notes (Deleted)
Virtual Visit via Video Note   This visit type was conducted due to national recommendations for restrictions regarding the COVID-19 Pandemic (e.g. social distancing) in an effort to limit this patient's exposure and mitigate transmission in our community.  Due to her co-morbid illnesses, this patient is at least at moderate risk for complications without adequate follow up.  This format is felt to be most appropriate for this patient at this time.  All issues noted in this document were discussed and addressed.  A limited physical exam was performed with this format.  Please refer to the patient's chart for her consent to telehealth for [facility].   I connected with  [name] on 10/02/20 by a video enabled telemedicine application and verified that I am speaking with the correct person using two identifiers. I discussed the limitations of evaluation and management by telemedicine. The patient expressed understanding and agreed to proceed.   Evaluation Performed:  Follow-up visit  Date:  10/02/2020   ID:  [name], DOB [birth year], MRN [id number]  Patient Location:  [address] [place] [address]   Provider location:   [facility], [place] office  PCP:  Margaretann Loveless, [name]  Cardiologist:  Fonnie Mu  Chief Complaint:  palpitations   History of Present Illness:    [name] is a 35 y.o. female who presents via audio/video conferencing for a telehealth visit today.   The patient does not symptoms concerning for COVID-19 infection (fever, chills, cough, or new SHORTNESS OF BREATH).   Patient has a past medical history of Smoker Reactive airway disease/asthma Morbid obesity Anxiety OSA, noncompliant with CPAP Who presents for follow-up of her fluid retention/leg swelling  Having palpitations Bad episode, lasted minutes,happened 2 weeks ago  Has had PAC and PVC before on monitor  Trying to control her  stress Works at [employer] vit D in 07/2018  Previous stress echo September 2017  Dr. Welton Flakes January 19, 2016 showing normal stress test breast attenuation artifact  CT scan chest was performed showing calcium score of 0 normal coronary arteries  Echocardiogram January 26, 2016 showing normal LV function normal right heart pressures mild TR mildly dilated left atrium Diastolic parameters normal  Previous Holter done through [facility] showing normal sinus rhythm APCs PVCs   chronic leg swelling    emergency room [facility] 11/13/2017, shortness of breath Chest x-ray was unremarkable Lab work normal, D-dimer negative  Lab work reviewed Creatinine 0.76 BUN 12 Hemoglobin A1c 4.9 LDL 78   Prior CV studies:   The following studies were reviewed today:   Past Medical History:  Diagnosis Date  . Anxiety   . Depression   . Elevated blood pressure   . GERD (gastroesophageal reflux disease)   . Hypertension   . Sleep apnea    Past Surgical History:  Procedure Laterality Date  . CESAREAN SECTION     at 28 weeks for pre-eclampsia, pulmonary edema, gestational DM.  Marland Kitchen dilationand curettage of Uterus  2007   SAB  . Headache:2009     Headache wellness consult. during pregnancy     Allergies:   Lactose intolerance (gi), Penicillins, and Pineapple   Social History   Tobacco Use  . Smoking status: Current Some Day Smoker    Types: Cigars  . Smokeless tobacco: Never Used  . Tobacco comment: smokes cigars  Vaping Use  . Vaping Use: Never used  Substance Use Topics  . Alcohol use:  Yes    Alcohol/week: 0.0 standard drinks    Comment: ocassional alcohol use; Mixed drinks once every 2-3 months  . Drug use: No     Current Outpatient Medications on File Prior to Visit  Medication Sig Dispense Refill  . albuterol (VENTOLIN HFA) 108 (90 Base) MCG/ACT inhaler Inhale 2 puffs into the lungs every 6 (six) hours as needed for wheezing or shortness of breath. 8 g 0  . diazepam  (VALIUM) 5 MG tablet Take 1 by mouth 1 hour  pre-procedure with very light food. May bring 2nd tablet to appointment. 2 tablet 0  . FLUoxetine (PROZAC) 20 MG tablet Take 1 tablet (20 mg total) by mouth daily. 30 tablet 1  . Insulin Pen Needle (BD PEN NEEDLE NANO 2ND GEN) 32G X 4 MM MISC To use daily with saxenda injection 100 each 3  . Liraglutide -Weight Management (SAXENDA) 18 MG/3ML SOPN Inject 0.1 mLs (0.6 mg total) into the skin daily. Increase by 0.6mg  weekly until 3mg  daily achieved. 15 mL 5  . medroxyPROGESTERone (DEPO-PROVERA) 150 MG/ML injection Inject 150 mg into the muscle every 3 (three) months.    . meloxicam (MOBIC) 15 MG tablet Take 1 tablet (15 mg total) by mouth daily as needed for pain. 30 tablet 0  . metroNIDAZOLE (METROGEL) 0.75 % vaginal gel Place 1 Applicatorful vaginally 2 (two) times daily as needed.    . montelukast (SINGULAIR) 10 MG tablet Take 10 mg by mouth at bedtime as needed.    . nystatin cream (MYCOSTATIN) Apply 1 application topically 2 (two) times daily as needed for dry skin.    omeprazole (PRILOSEC) 40 MG capsule Take 1 capsule (40 mg total) daily by mouth. 30 capsule 3  . ondansetron (ZOFRAN) 4 MG tablet Take 1 tablet (4 mg total) by mouth every 8 (eight) hours as needed. 20 tablet 5  . potassium chloride (KLOR-CON) 10 MEQ tablet Take 1 tablet (10 mEq total) by mouth daily. 90 tablet 1  . propranolol (INDERAL) 20 MG tablet Take 1 tablet (20 mg total) by mouth 3 (three) times daily as needed. For palpitations 90 tablet 5  . torsemide (DEMADEX) 20 MG tablet Take 1 tablet (20 mg total) by mouth daily. May add second pill if needed 60 tablet 5  . Vitamin D, Ergocalciferol, (DRISDOL) 1.25 MG (50000 UNIT) CAPS capsule Take 1 capsule (50,000 Units total) by mouth every 7 (seven) days. 12 capsule 1   Current Facility-Administered Medications on File Prior to Visit  Medication Dose Route Frequency Provider Last Rate Last Admin  . bupivacaine (MARCAINE) 0.5 % (with  pres) injection 3 mL  3 mL Other Once Marland Kitchen, MD      . medroxyPROGESTERone (DEPO-PROVERA) injection 150 mg  150 mg Intramuscular Once Tyrell Antonio, [name]         Family Hx: The patient's family history includes Hypertension in her maternal grandmother.  ROS:   Please see the history of present illness.    Review of Systems  Constitutional: Negative.   HENT: Negative.   Respiratory: Negative.   Cardiovascular: Negative.   Gastrointestinal: Negative.   Musculoskeletal: Negative.   Neurological: Negative.   Psychiatric/Behavioral: Negative.   All other systems reviewed and are negative.    Labs/Other Tests and Data Reviewed:    Recent Labs: 01/16/2020: ALT 16; BUN 10; Creatinine, Ser 0.88; Hemoglobin 12.5; Platelets 282; Potassium 4.5; Sodium 144; TSH 1.420   Recent Lipid Panel Lab Results  Component Value Date/Time   CHOL  150 07/24/2017 10:17 AM   TRIG 51 07/24/2017 10:17 AM   HDL 62 07/24/2017 10:17 AM   CHOLHDL 2.8 06/24/2016 09:56 AM   LDLCALC 78 07/24/2017 10:17 AM    Wt Readings from Last 3 Encounters:  07/13/20 (!) 342 lb (155.1 kg)  04/13/20 (!) 335 lb 14.4 oz (152.4 kg)  04/01/20 300 lb (136.1 kg)     Exam:    Vital Signs: Vital signs may also be detailed in the HPI There were no vitals taken for this visit.  Wt Readings from Last 3 Encounters:  07/13/20 (!) 342 lb (155.1 kg)  04/13/20 (!) 335 lb 14.4 oz (152.4 kg)  04/01/20 300 lb (136.1 kg)   Temp Readings from Last 3 Encounters:  07/13/20 99.2 F (37.3 C) (Oral)  04/13/20 98.3 F (36.8 C) (Oral)  02/10/20 98.2 F (36.8 C) (Tympanic)   BP Readings from Last 3 Encounters:  10/01/20 (!) 145/75  07/23/20 (!) 143/87  07/15/20 (!) 141/82   Pulse Readings from Last 3 Encounters:  10/01/20 (!) 41  07/23/20 (!) 102  07/15/20 94     Well nourished, well developed female in no acute distress. Constitutional:  oriented to person, place, and time. No distress.     ASSESSMENT &  PLAN:    Problem List Items Addressed This Visit   None    Chronic diastolic CHF (congestive heart failure) (HCC) Feels euvolemic Working on her weight  Morbid obesity (HCC) Lost 15 pounds, Changed eating habits Periodically on phentermine  Palpitations - Previous Holter monitor 2017 showing APCs and PVCs more symptomatic Offered b-blocker , will start propranolol 20 mg 3 times daily as needed Prefers a zio monitor, order has been placed  Leg edema Exacerbated by morbid obesity, sleep apnea, high fluid intake On torsemide   COVID-19 Education: The signs and symptoms of COVID-19 were discussed with the patient and how to seek care for testing (follow up with PCP or arrange E-visit).  The importance of social distancing was discussed today.  Patient Risk:   After full review of this patients clinical status, I feel that they are at least moderate risk at this time.  Time:   Today, I have spent 25 minutes with the patient with telehealth technology discussing the cardiac and medical problems/diagnoses detailed above   Additional 10 min spent reviewing the chart prior to patient visit today   Medication Adjustments/Labs and Tests Ordered: Current medicines are reviewed at length with the patient today.  Concerns regarding medicines are outlined above.   Tests Ordered: No tests ordered   Medication Changes: No changes made   Disposition: Follow-up in 12 months   Signed, Julien Nordmann, MD  [facility] [address], [place], [place] [address]

## 2020-10-04 ENCOUNTER — Encounter: Payer: Self-pay | Admitting: Physician Assistant

## 2020-10-04 ENCOUNTER — Telehealth: Payer: Managed Care, Other (non HMO) | Admitting: Physician Assistant

## 2020-10-04 DIAGNOSIS — R058 Other specified cough: Secondary | ICD-10-CM | POA: Diagnosis not present

## 2020-10-04 MED ORDER — MONTELUKAST SODIUM 10 MG PO TABS: 10.0000 mg | ORAL_TABLET | Freq: Every evening | ORAL | 0 refills | Status: DC | PRN

## 2020-10-04 MED ORDER — ALBUTEROL SULFATE HFA 108 (90 BASE) MCG/ACT IN AERS: 2.0000 | INHALATION_SPRAY | Freq: Four times a day (QID) | RESPIRATORY_TRACT | 0 refills | Status: DC | PRN

## 2020-10-04 MED ORDER — PROMETHAZINE-DM 6.25-15 MG/5ML PO SYRP: 5.0000 mL | ORAL_SOLUTION | Freq: Four times a day (QID) | ORAL | 0 refills | Status: DC | PRN

## 2020-10-04 NOTE — Patient Instructions (Signed)
Instructions sent to patients MyChart.

## 2020-10-04 NOTE — Progress Notes (Signed)
[name], [name] are scheduled for a virtual visit with your provider today.    Just as we do with appointments in the office, we must obtain your consent to participate.  Your consent will be active for this visit and any virtual visit you may have with one of our providers in the next 365 days.    If you have a MyChart account, I can also send a copy of this consent to you electronically.  All virtual visits are billed to your insurance company just like a traditional visit in the office.  As this is a virtual visit, video technology does not allow for your provider to perform a traditional examination.  This may limit your provider's ability to fully assess your condition.  If your provider identifies any concerns that need to be evaluated in person or the need to arrange testing such as labs, EKG, etc, we will make arrangements to do so.    Although advances in technology are sophisticated, we cannot ensure that it will always work on either your end or our end.  If the connection with a video visit is poor, we may have to switch to a telephone visit.  With either a video or telephone visit, we are not always able to ensure that we have a secure connection.   I need to obtain your verbal consent now.   Are you willing to proceed with your visit today?   [name] has provided verbal consent on 10/04/2020 for a virtual visit (video or telephone).  [name], [name] 10/04/2020  1:23 PM  Virtual Visit via Video   I connected with patient on 10/04/20 at  1:30 PM EDT by a video enabled telemedicine application and verified that I am speaking with the correct person using two identifiers.  Location patient: Home Location provider: [facility] Persons participating in the virtual visit: Patient, Provider  I discussed the limitations of evaluation and management by telemedicine and the availability of in person appointments. The patient expressed understanding and agreed  to proceed.  Subjective:   HPI:  Patient presents via [facility] today complaining of cough and chest tightness starting yesterday.  Patient with history of some mild intermittent asthma thought to be induced by seasonal allergies.  Also with a history of well-controlled diastolic heart failure, with use of torsemide as needed.  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  During that time she started developing nasal congestion with rhinorrhea and postnasal drip.  Also started to notice some dry cough at that time.  Now cough is dry or productive but of scant sputum.  Denies fever, chills, headaches.  Denies any sinus pain, ear pain or tooth pain.  Notes scratchy throat which she feels is secondary to cough.  Notes the cough kept her up all night and even made her gag.  Denies any recent travel or sick contact.  Has been out of her Singulair.  Has been taking OTC Mucinex fast max, honey and drinking warm tea.  Does note some very slight chest congestion but mainly congestion is in her head and sinuses.  Of note, patient had COVID at the end of February, followed by lingering cough for few weeks.  States has had no issue with cough up until yesterday.  Has checked her pulse and oxygen levels at time of visit with  heart rate of 103 bpm and oxygen at 97% on room air.  Has an appointment with her primary care provider tomorrow to discuss current symptoms, but felt due to the significance of her cough, she could not wait that long to be seen.  Denies any leg swelling or need to use her diuretic in the past several days.  ROS:   See pertinent positives and negatives per HPI.  Patient Active Problem List   Diagnosis Date Noted  . Chronic diastolic CHF (congestive heart failure) (HCC) 08/08/2018  . Depression, major, single episode, mild (HCC) 08/08/2018  . Asthma exacerbation  12/28/2017  . Benign essential HTN 05/04/2016  . History of pre-eclampsia 02/12/2015  . Abnormal vaginal Pap smear 02/05/2015  . Abortion, spontaneous 02/05/2015  . Anxiety 02/05/2015  . LBP (low back pain) 02/05/2015  . Fatigue 02/05/2015  . Fibrositis 02/05/2015  . Folliculitis 02/05/2015  . Acid reflux 02/05/2015  . History of diabetes mellitus arising in pregnancy 02/05/2015  . High risk sexual behavior 02/05/2015  . 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/2015  . Class 3 severe obesity due to excess calories with serious comorbidity and body mass index (BMI) of 60.0 to 69.9 in adult (HCC) 02/05/2015  . Obstructive sleep apnea 02/05/2015  . Vitamin D deficiency 02/05/2015  . Depression 02/05/2015  . Fibromyalgia 02/05/2015  . Abnormal C-reactive protein 10/24/2012  . Chronic low back pain 08/13/2012  . Absence of menstruation 09/22/2009  . Migraine without status migrainosus 07/22/2008    Social History   Tobacco Use  . Smoking status: Current Some Day Smoker    Types: Cigars  . Smokeless tobacco: Never Used  . Tobacco comment: smokes cigars  Substance Use Topics  . Alcohol use: Yes    Alcohol/week: 0.0 standard drinks    Comment: ocassional alcohol use; Mixed drinks once every 2-3 months    Current Outpatient Medications:  .  albuterol (VENTOLIN HFA) 108 (90 Base) MCG/ACT inhaler, Inhale 2 puffs into the lungs every 6 (six) hours as needed for wheezing or shortness of breath., Disp: 8 g, Rfl: 0 .  diazepam (VALIUM) 5 MG tablet, Take 1 by mouth 1 hour  pre-procedure with very light food. May bring 2nd tablet to appointment., Disp: 2 tablet, Rfl: 0 .  FLUoxetine (PROZAC) 20 MG tablet, Take 1 tablet (20 mg total) by mouth daily., Disp: 30 tablet, Rfl: 1 .  Insulin Pen Needle (BD PEN NEEDLE NANO 2ND GEN) 32G X 4 MM MISC, To use daily with saxenda injection, Disp: 100 each, Rfl: 3 .  Liraglutide -Weight Management (SAXENDA) 18 MG/3ML SOPN, Inject 0.1 mLs (0.6 mg total) into the skin daily.  Increase by 0.6mg  weekly until 3mg  daily achieved., Disp: 15 mL, Rfl: 5 .  medroxyPROGESTERone (DEPO-PROVERA) 150 MG/ML injection, Inject 150 mg into the muscle every 3 (three) months., Disp: , Rfl:  .  meloxicam (MOBIC) 15 MG tablet, Take 1 tablet (15 mg total) by mouth daily as needed for pain., Disp: 30 tablet, Rfl: 0 .  metroNIDAZOLE (METROGEL) 0.75 % vaginal gel, Place 1 Applicatorful vaginally 2 (two) times daily as needed., Disp: , Rfl:  .  montelukast (SINGULAIR) 10 MG tablet, Take 10 mg by mouth at bedtime as needed., Disp: , Rfl:  .  nystatin cream (MYCOSTATIN), Apply 1 application topically 2 (two) times daily as needed for dry skin., Disp: , Rfl:  .  omeprazole (PRILOSEC) 40 MG capsule, Take 1 capsule (40 mg total) daily by mouth., Disp: 30 capsule, Rfl: 3 .  ondansetron (  ZOFRAN) 4 MG tablet, Take 1 tablet (4 mg total) by mouth every 8 (eight) hours as needed., Disp: 20 tablet, Rfl: 5 .  potassium chloride (KLOR-CON) 10 MEQ tablet, Take 1 tablet (10 mEq total) by mouth daily., Disp: 90 tablet, Rfl: 1 .  propranolol (INDERAL) 20 MG tablet, Take 1 tablet (20 mg total) by mouth 3 (three) times daily as needed. For palpitations, Disp: 90 tablet, Rfl: 5 .  torsemide (DEMADEX) 20 MG tablet, Take 1 tablet (20 mg total) by mouth daily. May add second pill if needed, Disp: 60 tablet, Rfl: 5 .  Vitamin D, Ergocalciferol, (DRISDOL) 1.25 MG (50000 UNIT) CAPS capsule, Take 1 capsule (50,000 Units total) by mouth every 7 (seven) days., Disp: 12 capsule, Rfl: 1  Current Facility-Administered Medications:  .  bupivacaine (MARCAINE) 0.5 % (with pres) injection 3 mL, 3 mL, Other, Once, Tyrell Antonio, MD .  medroxyPROGESTERone (DEPO-PROVERA) injection 150 mg, 150 mg, Intramuscular, Once, Margaretann Loveless, [name]  Allergies  Allergen Reactions  . Lactose Intolerance (Gi) Diarrhea  . Penicillins Hives    Has patient had a PCN reaction causing immediate rash, facial/tongue/throat swelling, SOB or  lightheadedness with hypotension: Yes Has patient had a PCN reaction causing severe rash involving mucus membranes or skin necrosis: No Has patient had a PCN reaction that required hospitalization No Has patient had a PCN reaction occurring within the last 10 years: Yes If all of the above answers are "NO", then may proceed with Cephalosporin use.  Marland Kitchen Pineapple Other (See Comments)    Patient states this cuts my mouth and makes it bleed.    Objective:   There were no vitals taken for this visit.  Patient is well-developed, well-nourished in no acute distress.  Resting comfortably at home.  Head is normocephalic, atraumatic.  No labored breathing.  Speech is clear and coherent with logical content.  Patient is alert and oriented at baseline.   Assessment and Plan:   1. Allergic cough Current issue seems to be related to allergic inflammation coupled with exposure to extreme variations in temperature.  We will have her restart Singulair once daily.  A prescription has been sent into the pharmacy.  She is aware further refills will need to come from her primary care provider.  We will have her stop Mucinex fast max given her history of heart failure.  We will have her start plain Mucinex.  Rx Promethazine DM cough syrup to use as directed.  Rx albuterol inhaler to use up to every 4-6 hours as needed for chest tightness and wheezing.  Discussed with her although she is unlikely to have COVID given recent infection and suspected natural temporal immunity, if anything worsens she will need to be tested.  Thankfully she already has an appointment with her primary care provider tomorrow so she can get a lung examination and reassessment after starting care given today.  Strict urgent care and ER precautions given to patient, who voiced understanding and agreement with the plan. - montelukast (SINGULAIR) 10 MG tablet; Take 1 tablet (10 mg total) by mouth at bedtime as needed.  Dispense: 30 tablet;  Refill: 0 - albuterol (VENTOLIN HFA) 108 (90 Base) MCG/ACT inhaler; Inhale 2 puffs into the lungs every 6 (six) hours as needed for wheezing or shortness of breath.  Dispense: 8 g; Refill: 0 - promethazine-dextromethorphan (PROMETHAZINE-DM) 6.25-15 MG/5ML syrup; Take 5 mLs by mouth 4 (four) times daily as needed for cough.  Dispense: 118 mL; Refill: 0 .   Chrissie Noa  Malva Cogan, [name] 10/04/2020

## 2020-10-05 ENCOUNTER — Ambulatory Visit: Payer: Managed Care, Other (non HMO) | Admitting: Cardiovascular Disease

## 2020-10-05 ENCOUNTER — Telehealth (INDEPENDENT_AMBULATORY_CARE_PROVIDER_SITE_OTHER): Payer: Managed Care, Other (non HMO) | Admitting: Family Medicine

## 2020-10-05 DIAGNOSIS — Z91199 Patient's noncompliance with other medical treatment and regimen due to unspecified reason: Secondary | ICD-10-CM

## 2020-10-05 DIAGNOSIS — Z5329 Procedure and treatment not carried out because of patient's decision for other reasons: Secondary | ICD-10-CM

## 2020-10-05 NOTE — Progress Notes (Signed)
  Patient cancelled appt prior to appointment time

## 2020-10-20 ENCOUNTER — Encounter: Payer: Self-pay | Admitting: Family Medicine

## 2020-10-20 ENCOUNTER — Telehealth: Payer: Self-pay

## 2020-10-20 ENCOUNTER — Telehealth (INDEPENDENT_AMBULATORY_CARE_PROVIDER_SITE_OTHER): Payer: Managed Care, Other (non HMO) | Admitting: Family Medicine

## 2020-10-20 DIAGNOSIS — R053 Chronic cough: Secondary | ICD-10-CM | POA: Diagnosis not present

## 2020-10-20 MED ORDER — PREDNISONE 10 MG PO TABS: ORAL_TABLET | ORAL | 0 refills | Status: AC

## 2020-10-20 NOTE — Telephone Encounter (Signed)
Copied from CRM [id number]. Topic: General - Other >> Oct 20, 2020 11:44 AM [name] wrote: Reason for CRM: Pt returned call for pre chart/ please advise

## 2020-10-20 NOTE — Progress Notes (Signed)
MyChart Video Visit    Virtual Visit via Video Note   This visit type was conducted due to national recommendations for restrictions regarding the COVID-19 Pandemic (e.g. social distancing) in an effort to limit this patient's exposure and mitigate transmission in our community. This patient is at least at moderate risk for complications without adequate follow up. This format is felt to be most appropriate for this patient at this time. Physical exam was limited by quality of the video and audio technology used for the visit.   Patient location: home Provider location: [facility]  I discussed the limitations of evaluation and management by telemedicine and the availability of in person appointments. The patient expressed understanding and agreed to proceed.  Patient: [name]   DOB: 1985/12/14   35 y.o. Female  MRN: [id number] Visit Date: 10/20/2020  Today's healthcare provider: Mila Merry, MD   Chief Complaint  Patient presents with  . Cough   Subjective    Cough This is a recurrent problem. Episode onset: 06/2020. The cough is non-productive. Associated symptoms include postnasal drip and rhinorrhea. Pertinent negatives include no chest pain, chills, fever or shortness of breath.    Patient had a [place] tele health visit over 1 week ago and was prescribed Promethazine- DM cough syrup and a Ventolin inhaler Patient states the cough medication helped but she is still having a cough. Started when she had Covid in February. She also reports history of asthma and has CHF as a dequela of pre-ecclampsia.   Social History   Tobacco Use  . Smoking status: Current Some Day Smoker    Types: Cigars  . Smokeless tobacco: Never Used  . Tobacco comment: smokes cigars  Vaping Use  . Vaping Use: Never used  Substance Use Topics  . Alcohol use: Yes    Alcohol/week: 0.0 standard drinks    Comment: ocassional alcohol use; Mixed drinks once every 2-3 months  . Drug use: No    Allergies  Allergen Reactions  . Lactose Intolerance (Gi) Diarrhea  . Penicillins Hives    Has patient had a PCN reaction causing immediate rash, facial/tongue/throat swelling, SOB or lightheadedness with hypotension: Yes Has patient had a PCN reaction causing severe rash involving mucus membranes or skin necrosis: No Has patient had a PCN reaction that required hospitalization No Has patient had a PCN reaction occurring within the last 10 years: Yes If all of the above answers are "NO", then may proceed with Cephalosporin use.  Marland Kitchen Pineapple Other (See Comments)    Patient states this cuts my mouth and makes it bleed.    Medications: Outpatient Medications Prior to Visit  Medication Sig  . albuterol (VENTOLIN HFA) 108 (90 Base) MCG/ACT inhaler Inhale 2 puffs into the lungs every 6 (six) hours as needed for wheezing or shortness of breath.  . medroxyPROGESTERone (DEPO-PROVERA) 150 MG/ML injection Inject 150 mg into the muscle every 3 (three) months.  . metroNIDAZOLE (METROGEL) 0.75 % vaginal gel Place 1 Applicatorful vaginally 2 (two) times daily as needed.  . montelukast (SINGULAIR) 10 MG tablet Take 1 tablet (10 mg total) by mouth at bedtime as needed.  . nystatin cream (MYCOSTATIN) Apply 1 application topically 2 (two) times daily as needed for dry skin.  Marland Kitchen omeprazole (PRILOSEC) 40 MG capsule Take 1 capsule (40 mg total) daily by mouth.  . potassium chloride (KLOR-CON) 10 MEQ tablet Take 1 tablet (10 mEq total) by mouth daily.  . promethazine-dextromethorphan (PROMETHAZINE-DM) 6.25-15 MG/5ML syrup Take 5  mLs by mouth 4 (four) times daily as needed for cough.  . propranolol (INDERAL) 20 MG tablet Take 1 tablet (20 mg total) by mouth 3 (three) times daily as needed. For palpitations  . torsemide (DEMADEX) 20 MG tablet Take 1 tablet (20 mg total) by mouth daily. May add second pill if needed  . Vitamin D, Ergocalciferol, (DRISDOL) 1.25 MG (50000 UNIT) CAPS capsule Take 1 capsule (50,000  Units total) by mouth every 7 (seven) days.  Marland Kitchen FLUoxetine (PROZAC) 20 MG tablet Take 1 tablet (20 mg total) by mouth daily. (Patient not taking: Reported on 10/20/2020)  . Insulin Pen Needle (BD PEN NEEDLE NANO 2ND GEN) 32G X 4 MM MISC To use daily with saxenda injection (Patient not taking: Reported on 10/20/2020)  . Liraglutide -Weight Management (SAXENDA) 18 MG/3ML SOPN Inject 0.1 mLs (0.6 mg total) into the skin daily. Increase by 0.6mg  weekly until 3mg  daily achieved. (Patient not taking: Reported on 10/20/2020)  . ondansetron (ZOFRAN) 4 MG tablet Take 1 tablet (4 mg total) by mouth every 8 (eight) hours as needed. (Patient not taking: Reported on 10/20/2020)  . [DISCONTINUED] diazepam (VALIUM) 5 MG tablet Take 1 by mouth 1 hour  pre-procedure with very light food. May bring 2nd tablet to appointment.  . [DISCONTINUED] meloxicam (MOBIC) 15 MG tablet Take 1 tablet (15 mg total) by mouth daily as needed for pain. (Patient not taking: Reported on 10/20/2020)   Facility-Administered Medications Prior to Visit  Medication Dose Route Frequency Provider  . bupivacaine (MARCAINE) 0.5 % (with pres) injection 3 mL  3 mL Other Once 10/22/2020, MD  . medroxyPROGESTERone (DEPO-PROVERA) injection 150 mg  150 mg Intramuscular Once Tyrell Antonio, [name]    Review of Systems  Constitutional: Positive for fatigue. Negative for appetite change, chills and fever.  HENT: Positive for congestion, postnasal drip and rhinorrhea.   Respiratory: Positive for cough (dry). Negative for chest tightness and shortness of breath.   Cardiovascular: Negative for chest pain and palpitations.  Gastrointestinal: Negative for abdominal pain, nausea and vomiting.  Neurological: Negative for dizziness and weakness.     Objective     Physical Exam   Awake, alert, oriented x 3. In no apparent distress    Assessment & Plan     1. Chronic cough Onset with otherwise mild covid infection in February. She does have  history asthma and CHF. - DG Chest 2 View; Future - predniSONE (DELTASONE) 10 MG tablet; 6 tablets for 1 day, then 5 for 1 day, then 4 for 1 day, then 3 for 1 day, then 2 for 1 day then 1 for 1 day.  Dispense: 21 tablet; Refill: 0   Consider steroid inhaler.      I discussed the assessment and treatment plan with the patient. The patient was provided an opportunity to ask questions and all were answered. The patient agreed with the plan and demonstrated an understanding of the instructions.   The patient was advised to call back or seek an in-person evaluation if the symptoms worsen or if the condition fails to improve as anticipated.  I provided 12 minutes of non-face-to-face time during this encounter.  The entirety of the information documented in the History of Present Illness, Review of Systems and Physical Exam were personally obtained by me. Portions of this information were initially documented by the CMA and reviewed by me for thoroughness and accuracy.     March, MD [facility] [phone] (phone) [phone] (fax)  [facility]

## 2020-10-21 ENCOUNTER — Ambulatory Visit
Admission: RE | Admit: 2020-10-21 | Discharge: 2020-10-21 | Disposition: A | Discharge status: Home / Self Care | Payer: Managed Care, Other (non HMO) | Source: Ambulatory Visit | Attending: Family Medicine | Admitting: Family Medicine

## 2020-10-21 ENCOUNTER — Other Ambulatory Visit: Payer: Self-pay | Admitting: Family Medicine

## 2020-10-21 ENCOUNTER — Other Ambulatory Visit: Payer: Self-pay

## 2020-10-21 ENCOUNTER — Ambulatory Visit
Admission: RE | Admit: 2020-10-21 | Discharge: 2020-10-21 | Disposition: A | Discharge status: Home / Self Care | Payer: Managed Care, Other (non HMO) | Attending: Family Medicine | Admitting: Family Medicine

## 2020-10-21 DIAGNOSIS — R053 Chronic cough: Secondary | ICD-10-CM

## 2020-10-21 MED ORDER — FLUTICASONE-SALMETEROL 250-50 MCG/ACT IN AEPB: 1.0000 | INHALATION_SPRAY | Freq: Two times a day (BID) | RESPIRATORY_TRACT | 0 refills | Status: DC

## 2020-10-22 NOTE — Progress Notes (Signed)
Evaluation Performed:  Follow-up visit  Date:  10/22/2020   ID:  [name], DOB 01/05/1986, MRN [id number]  Patient Location:  [address] [place] [address]   Provider location:   [facility], [place] office  PCP:  Malva Limes, MD  Cardiologist:  [facility]  Chief Complaint  Patient presents with  . 6 month follow up    "doing well." Medications reviewed by the patient verbally.     History of Present Illness:    [name] is a 35 y.o. female  past medical history of Smoker Reactive airway disease/asthma Morbid obesity 1 Anxiety OSA, noncompliant with CPAP daistolic CHF Chronic leg swelling Who presents for follow-up of her fluid retention/leg swelling  In follow-up today reports having chronic cough since she got COVID February 2022 On inhalers, About to start prednisone Primary care following  Difficulty taking torsemide during the week secondary to busy work schedule Takes torsemide on the weekends at least 3 pills Sat does 2 torsemide ('gets off 10 pounds"), Sunday does 1 torsemide ("5 pounds off") Legs get puffy during the week  Prior hx of palpitations  PAC and PVC before on monitor  Works at [employer]  EKG personally reviewed by myself on todays visit Shows normal sinus rhythm rate 92 bpm no significant ST-T wave changes  Other past medical history reviewed Previous stress echo September 2017  Dr. Welton Flakes January 19, 2016 showing normal stress test breast attenuation artifact  CT scan chest was performed showing calcium score of 0 normal coronary arteries  Echocardiogram January 26, 2016 showing normal LV function normal right heart pressures mild TR mildly dilated left atrium Diastolic parameters normal  Previous Holter done through [facility] showing normal sinus rhythm APCs PVCs and   emergency room [facility] 11/13/2017, shortness of breath Chest x-ray was unremarkable Lab work normal, D-dimer  negative  Lab work reviewed Creatinine 0.76 BUN 12 Hemoglobin A1c 4.9 LDL 78   Prior CV studies:   The following studies were reviewed today:   Past Medical History:  Diagnosis Date  . Anxiety   . Depression   . Elevated blood pressure   . GERD (gastroesophageal reflux disease)   . Hypertension   . Sleep apnea    Past Surgical History:  Procedure Laterality Date  . CESAREAN SECTION     at 28 weeks for pre-eclampsia, pulmonary edema, gestational DM.  Marland Kitchen dilationand curettage of Uterus  2007   SAB  . Headache:2009     Headache wellness consult. during pregnancy     Allergies:   Lactose intolerance (gi), Penicillins, and Pineapple   Social History   Tobacco Use  . Smoking status: Current Some Day Smoker    Types: Cigars  . Smokeless tobacco: Never Used  . Tobacco comment: smokes cigars  Vaping Use  . Vaping Use: Never used  Substance Use Topics  . Alcohol use: Yes    Alcohol/week: 0.0 standard drinks    Comment: ocassional alcohol use; Mixed drinks once every 2-3 months  . Drug use: No     Current Outpatient Medications on File Prior to Visit  Medication Sig Dispense Refill  . albuterol (VENTOLIN HFA) 108 (90 Base) MCG/ACT inhaler Inhale 2 puffs into the lungs every 6 (six) hours as needed for wheezing or shortness of breath. 8 g 0  . FLUoxetine (PROZAC) 20 MG tablet Take 1 tablet (20 mg total) by mouth daily. (Patient not taking: Reported on 10/20/2020) 30  tablet 1  . fluticasone-salmeterol (ADVAIR DISKUS) 250-50 MCG/ACT AEPB Inhale 1 puff into the lungs 2 (two) times daily. 60 each 0  . Insulin Pen Needle (BD PEN NEEDLE NANO 2ND GEN) 32G X 4 MM MISC To use daily with saxenda injection (Patient not taking: Reported on 10/20/2020) 100 each 3  . Liraglutide -Weight Management (SAXENDA) 18 MG/3ML SOPN Inject 0.1 mLs (0.6 mg total) into the skin daily. Increase by 0.6mg  weekly until 3mg  daily achieved. (Patient not taking: Reported on 10/20/2020) 15 mL 5  .  medroxyPROGESTERone (DEPO-PROVERA) 150 MG/ML injection Inject 150 mg into the muscle every 3 (three) months.    . metroNIDAZOLE (METROGEL) 0.75 % vaginal gel Place 1 Applicatorful vaginally 2 (two) times daily as needed.    . montelukast (SINGULAIR) 10 MG tablet Take 1 tablet (10 mg total) by mouth at bedtime as needed. 30 tablet 0  . nystatin cream (MYCOSTATIN) Apply 1 application topically 2 (two) times daily as needed for dry skin.    10/22/2020 omeprazole (PRILOSEC) 40 MG capsule Take 1 capsule (40 mg total) daily by mouth. 30 capsule 3  . ondansetron (ZOFRAN) 4 MG tablet Take 1 tablet (4 mg total) by mouth every 8 (eight) hours as needed. (Patient not taking: Reported on 10/20/2020) 20 tablet 5  . potassium chloride (KLOR-CON) 10 MEQ tablet Take 1 tablet (10 mEq total) by mouth daily. 90 tablet 1  . predniSONE (DELTASONE) 10 MG tablet 6 tablets for 1 day, then 5 for 1 day, then 4 for 1 day, then 3 for 1 day, then 2 for 1 day then 1 for 1 day. 21 tablet 0  . promethazine-dextromethorphan (PROMETHAZINE-DM) 6.25-15 MG/5ML syrup Take 5 mLs by mouth 4 (four) times daily as needed for cough. 118 mL 0  . propranolol (INDERAL) 20 MG tablet Take 1 tablet (20 mg total) by mouth 3 (three) times daily as needed. For palpitations 90 tablet 5  . torsemide (DEMADEX) 20 MG tablet Take 1 tablet (20 mg total) by mouth daily. May add second pill if needed 60 tablet 5  . Vitamin D, Ergocalciferol, (DRISDOL) 1.25 MG (50000 UNIT) CAPS capsule Take 1 capsule (50,000 Units total) by mouth every 7 (seven) days. 12 capsule 1   Current Facility-Administered Medications on File Prior to Visit  Medication Dose Route Frequency Provider Last Rate Last Admin  . bupivacaine (MARCAINE) 0.5 % (with pres) injection 3 mL  3 mL Other Once 07-25-1990, MD      . medroxyPROGESTERone (DEPO-PROVERA) injection 150 mg  150 mg Intramuscular Once Tyrell Antonio, [name]         Family Hx: The patient's family history includes Hypertension  in her maternal grandmother.  ROS:   Please see the history of present illness.    Review of Systems  Constitutional: Negative.   HENT: Negative.   Respiratory: Negative.   Cardiovascular: Negative.   Gastrointestinal: Negative.   Musculoskeletal: Negative.   Neurological: Negative.   Psychiatric/Behavioral: Negative.   All other systems reviewed and are negative.    Labs/Other Tests and Data Reviewed:    Recent Labs: 01/16/2020: ALT 16; BUN 10; Creatinine, Ser 0.88; Hemoglobin 12.5; Platelets 282; Potassium 4.5; Sodium 144; TSH 1.420   Recent Lipid Panel Lab Results  Component Value Date/Time   CHOL 150 07/24/2017 10:17 AM   TRIG 51 07/24/2017 10:17 AM   HDL 62 07/24/2017 10:17 AM   CHOLHDL 2.8 06/24/2016 09:56 AM   LDLCALC 78 07/24/2017 10:17 AM    Wt  Readings from Last 3 Encounters:  07/13/20 (!) 342 lb (155.1 kg)  04/13/20 (!) 335 lb 14.4 oz (152.4 kg)  04/01/20 300 lb (136.1 kg)     Exam:    BP (!) 134/94 (BP Location: Left Wrist, Patient Position: Sitting, Cuff Size: Normal)   Pulse 92   Ht 5\' 1"  (1.549 m)   Wt (!) 343 lb 6 oz (155.8 kg)   SpO2 99%   BMI 64.88 kg/m  Constitutional: Obese oriented to person, place, and time. No distress.  HENT:  Head: Grossly normal Eyes:  no discharge. No scleral icterus.  Neck: No JVD, no carotid bruits  Cardiovascular: Regular rate and rhythm, no murmurs appreciated Pulmonary/Chest: Clear to auscultation bilaterally, no wheezes or rails Abdominal: Soft.  no distension.  no tenderness.  Musculoskeletal: Normal range of motion Neurological:  normal muscle tone. Coordination normal. No atrophy Skin: Skin warm and dry Psychiatric: normal affect, pleasant   ASSESSMENT & PLAN:    Problem List Items Addressed This Visit   None    Chronic diastolic CHF (congestive heart failure) (HCC) Feels euvolemic Fluctuating fluid status has difficulty taking torsemide during the week secondary to work schedule  Morbid obesity  (HCC) Weight trending back up past 6 months Periodically on phentermine  Palpitations - Previous Holter monitor 2017 showing APCs and PVCs Propranolol as needed Prior event monitor: Patient triggered events associated with PACs, sometimes with atrial tachycardia   Leg edema Exacerbated by morbid obesity, sleep apnea, high fluid intake Continue torsemide Unable to take this during the week secondary to work schedule but could try it in the late afternoon, extra on weekends   Total encounter time more than 25 minutes  Greater than 50% was spent in counseling and coordination of care with the patient   Signed, 2018, MD  [facility] [address], [place], [place]

## 2020-10-23 ENCOUNTER — Other Ambulatory Visit: Payer: Self-pay

## 2020-10-23 ENCOUNTER — Encounter: Payer: Self-pay | Admitting: Cardiovascular Disease

## 2020-10-23 ENCOUNTER — Ambulatory Visit (INDEPENDENT_AMBULATORY_CARE_PROVIDER_SITE_OTHER): Payer: Managed Care, Other (non HMO) | Admitting: Cardiovascular Disease

## 2020-10-23 VITALS — BP 134/94 | HR 92 | Ht 61.0 in | Wt 343.4 lb

## 2020-10-23 DIAGNOSIS — R600 Localized edema: Secondary | ICD-10-CM | POA: Diagnosis not present

## 2020-10-23 DIAGNOSIS — E876 Hypokalemia: Secondary | ICD-10-CM

## 2020-10-23 DIAGNOSIS — R002 Palpitations: Secondary | ICD-10-CM

## 2020-10-23 DIAGNOSIS — I5032 Chronic diastolic (congestive) heart failure: Secondary | ICD-10-CM

## 2020-10-23 DIAGNOSIS — I10 Essential (primary) hypertension: Secondary | ICD-10-CM

## 2020-10-23 MED ORDER — POTASSIUM CHLORIDE ER 10 MEQ PO TBCR: 10.0000 meq | EXTENDED_RELEASE_TABLET | Freq: Every day | ORAL | 3 refills | Status: DC

## 2020-10-23 NOTE — Patient Instructions (Signed)
Medication Instructions:  No changes  If you need a refill on your cardiac medications before your next appointment, please call your pharmacy.    Lab work: No new labs needed   If you have labs (blood work) drawn today and your tests are completely normal, you will receive your results only by: . MyChart Message (if you have MyChart) OR . A paper copy in the mail If you have any lab test that is abnormal or we need to change your treatment, we will call you to review the results.   Testing/Procedures: No new testing needed   Follow-Up: At [facility], you and your health needs are our priority.  As part of our continuing mission to provide you with exceptional heart care, we have created designated Provider Care Teams.  These Care Teams include your primary Cardiologist (physician) and Advanced Practice Providers (APPs -  Physician Assistants and Nurse Practitioners) who all work together to provide you with the care you need, when you need it.  . You will need a follow up appointment in 12 months  . Providers on your designated Care Team:   . Christopher Berge, NP . Ryan Dunn, [name] . Jacquelyn Visser, [name]  Any Other Special Instructions Will Be Listed Below (If Applicable).  COVID-19 Vaccine Information can be found at: [email or web address] For questions related to vaccine distribution or appointments, please email [email or web address] or call [phone].     

## 2020-11-09 ENCOUNTER — Telehealth: Payer: Self-pay | Admitting: Cardiovascular Disease

## 2020-11-09 NOTE — Telephone Encounter (Signed)
Spoke with patient and she reports after taking torsemide she developed hives. She took this on Friday afternoon around 7 pm and then at 7:30 pm she noticed the hives. She has not started any new medications and no new foods. She was previously taking daily but changed to weekends because of her work schedule. She did have to take benadryl for the itching and on Saturday the itching had stopped and hives were gone. Reviewed all other medications and she has been on those for some time as well. Advised to not take anymore until I can have someone review and provide some recommendations. She verbalized understanding and will forward to provider and pharmacy team for review.

## 2020-11-09 NOTE — Telephone Encounter (Signed)
Its a little odd that she would develop hives to a medication she has been taking for a while. Was it a new manufacture? Has the color changed? If it wasn't then I would suggest she re challenge with another dose to be sure it was from the medicine.

## 2020-11-09 NOTE — Telephone Encounter (Signed)
Spoke with patient and reviewed pharmacy recommendations. Advised to call pharmacy to see if this was a different manufacture from previous prescriptions and if so then let us know. If not then try to re challenge by trying again to see if these symptoms return. She was agreeable with this plan and will reach out to pharmacy to inquire if there were any changes. She verbalized understanding of our conversation, agreement with plan, and had no further questions at this time.

## 2020-11-09 NOTE — Telephone Encounter (Signed)
Pt c/o medication issue:  1. Name of Medication: Torsemide   2. How are you currently taking this medication (dosage and times per day)? 20mg  2 tablets daily  3. Are you having a reaction (difficulty breathing--STAT)? Broke out in hives on body  4. What is your medication issue? Been taking for awhile this is a new reaction concern

## 2020-12-14 ENCOUNTER — Telehealth: Payer: Managed Care, Other (non HMO) | Admitting: Physician Assistant

## 2020-12-14 ENCOUNTER — Encounter: Payer: Self-pay | Admitting: Physician Assistant

## 2020-12-14 DIAGNOSIS — H66001 Acute suppurative otitis media without spontaneous rupture of ear drum, right ear: Secondary | ICD-10-CM | POA: Diagnosis not present

## 2020-12-14 MED ORDER — DOXYCYCLINE HYCLATE 100 MG PO TABS: 100.0000 mg | ORAL_TABLET | Freq: Two times a day (BID) | ORAL | 0 refills | Status: DC

## 2020-12-14 NOTE — Progress Notes (Signed)
Virtual Visit Consent   [name], you are scheduled for a virtual visit with a [facility] provider today.     Just as with appointments in the office, your consent must be obtained to participate.  Your consent will be active for this visit and any virtual visit you may have with one of our providers in the next 365 days.     If you have a MyChart account, a copy of this consent can be sent to you electronically.  All virtual visits are billed to your insurance company just like a traditional visit in the office.    As this is a virtual visit, video technology does not allow for your provider to perform a traditional examination.  This may limit your provider's ability to fully assess your condition.  If your provider identifies any concerns that need to be evaluated in person or the need to arrange testing (such as labs, EKG, etc.), we will make arrangements to do so.     Although advances in technology are sophisticated, we cannot ensure that it will always work on either your end or our end.  If the connection with a video visit is poor, the visit may have to be switched to a telephone visit.  With either a video or telephone visit, we are not always able to ensure that we have a secure connection.     I need to obtain your verbal consent now.   Are you willing to proceed with your visit today?    [name] has provided verbal consent on 12/14/2020 for a virtual visit (video or telephone).   Margaretann Loveless, [name]   Date: 12/14/2020 6:47 PM   Virtual Visit via Video Note   I, Margaretann Loveless, connected with  [name]  ([id number], 1986/02/08) on 12/14/20 at  6:45 PM EDT by a video-enabled telemedicine application and verified that I am speaking with the correct person using two identifiers.  Location: Patient: Virtual Visit Location Patient: Home Provider: Virtual Visit Location Provider: Home Office   I discussed the limitations of evaluation and  management by telemedicine and the availability of in person appointments. The patient expressed understanding and agreed to proceed.    History of Present Illness: [name] is a 35 y.o. who identifies as a female who was assigned female at birth, and is being seen today for ear pain.  HPI: Otalgia  There is pain in the right ear. This is a new problem. The current episode started 1 to 4 weeks ago (started Sunday over a week ago, both ears felt full. Then felt a little better through the week. Then Thursday of last week started having more sharp pain in the right ear. Over the weekend the pain has worsened). The problem occurs constantly. The problem has been gradually worsening. There has been no fever. The pain is moderate. Associated symptoms include coughing, headaches, hearing loss and a sore throat. Pertinent negatives include no ear discharge or rhinorrhea. Treatments tried: alka seltzer cold and flu. The treatment provided no relief. There is no history of a chronic ear infection, hearing loss or a tympanostomy tube.    Covid testing is negative  Problems:  Patient Active Problem List   Diagnosis Date Noted   Chronic diastolic CHF (congestive heart failure) (HCC) 08/08/2018   Depression, major, single episode, mild (HCC) 08/08/2018   Asthma exacerbation 12/28/2017   Benign essential HTN 05/04/2016   History of pre-eclampsia 02/12/2015  Abnormal vaginal Pap smear 02/05/2015   Abortion, spontaneous 02/05/2015   Anxiety 02/05/2015   LBP (low back pain) 02/05/2015   Fatigue 02/05/2015   Fibrositis 02/05/2015   Folliculitis 02/05/2015   Acid reflux 02/05/2015   History of diabetes mellitus arising in pregnancy 02/05/2015   High risk sexual behavior 02/05/2015   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/2015   Class 3 severe obesity due to excess calories with serious comorbidity and body mass index (BMI) of 60.0 to 69.9 in adult (HCC) 02/05/2015   Obstructive sleep apnea 02/05/2015   Vitamin  D deficiency 02/05/2015   Depression 02/05/2015   Fibromyalgia 02/05/2015   Abnormal C-reactive protein 10/24/2012   Chronic low back pain 08/13/2012   Absence of menstruation 09/22/2009   Migraine without status migrainosus 07/22/2008    Allergies:  Allergies  Allergen Reactions   Lactose Intolerance (Gi) Diarrhea   Penicillins Hives    Has patient had a PCN reaction causing immediate rash, facial/tongue/throat swelling, SOB or lightheadedness with hypotension: Yes Has patient had a PCN reaction causing severe rash involving mucus membranes or skin necrosis: No Has patient had a PCN reaction that required hospitalization No Has patient had a PCN reaction occurring within the last 10 years: Yes If all of the above answers are "NO", then may proceed with Cephalosporin use.   Pineapple Other (See Comments)    Patient states this cuts my mouth and makes it bleed.   Medications:  Current Outpatient Medications:    doxycycline (VIBRA-TABS) 100 MG tablet, Take 1 tablet (100 mg total) by mouth 2 (two) times daily., Disp: 14 tablet, Rfl: 0   albuterol (VENTOLIN HFA) 108 (90 Base) MCG/ACT inhaler, Inhale 2 puffs into the lungs every 6 (six) hours as needed for wheezing or shortness of breath., Disp: 8 g, Rfl: 0   FLUoxetine (PROZAC) 20 MG tablet, Take 1 tablet (20 mg total) by mouth daily., Disp: 30 tablet, Rfl: 1   fluticasone-salmeterol (ADVAIR DISKUS) 250-50 MCG/ACT AEPB, Inhale 1 puff into the lungs 2 (two) times daily., Disp: 60 each, Rfl: 0   Insulin Pen Needle (BD PEN NEEDLE NANO 2ND GEN) 32G X 4 MM MISC, To use daily with saxenda injection, Disp: 100 each, Rfl: 3   Liraglutide -Weight Management (SAXENDA) 18 MG/3ML SOPN, Inject 0.1 mLs (0.6 mg total) into the skin daily. Increase by 0.6mg  weekly until 3mg  daily achieved., Disp: 15 mL, Rfl: 5   medroxyPROGESTERone (DEPO-PROVERA) 150 MG/ML injection, Inject 150 mg into the muscle every 3 (three) months., Disp: , Rfl:    metroNIDAZOLE  (METROGEL) 0.75 % vaginal gel, Place 1 Applicatorful vaginally 2 (two) times daily as needed., Disp: , Rfl:    montelukast (SINGULAIR) 10 MG tablet, Take 1 tablet (10 mg total) by mouth at bedtime as needed., Disp: 30 tablet, Rfl: 0   nystatin cream (MYCOSTATIN), Apply 1 application topically 2 (two) times daily as needed for dry skin., Disp: , Rfl:    omeprazole (PRILOSEC) 40 MG capsule, Take 1 capsule (40 mg total) daily by mouth., Disp: 30 capsule, Rfl: 3   ondansetron (ZOFRAN) 4 MG tablet, Take 1 tablet (4 mg total) by mouth every 8 (eight) hours as needed., Disp: 20 tablet, Rfl: 5   potassium chloride (KLOR-CON) 10 MEQ tablet, Take 1 tablet (10 mEq total) by mouth daily., Disp: 90 tablet, Rfl: 3   promethazine-dextromethorphan (PROMETHAZINE-DM) 6.25-15 MG/5ML syrup, Take 5 mLs by mouth 4 (four) times daily as needed for cough., Disp: 118 mL, Rfl: 0   propranolol (INDERAL)  20 MG tablet, Take 1 tablet (20 mg total) by mouth 3 (three) times daily as needed. For palpitations, Disp: 90 tablet, Rfl: 5   tiZANidine (ZANAFLEX) 4 MG tablet, 1/2-1 q HS prn, Disp: , Rfl:    torsemide (DEMADEX) 20 MG tablet, Take 1 tablet (20 mg total) by mouth daily. May add second pill if needed, Disp: 60 tablet, Rfl: 5   Vitamin D, Ergocalciferol, (DRISDOL) 1.25 MG (50000 UNIT) CAPS capsule, Take 1 capsule (50,000 Units total) by mouth every 7 (seven) days., Disp: 12 capsule, Rfl: 1  Current Facility-Administered Medications:    bupivacaine (MARCAINE) 0.5 % (with pres) injection 3 mL, 3 mL, Other, Once, Tyrell Antonio, MD   medroxyPROGESTERone (DEPO-PROVERA) injection 150 mg, 150 mg, Intramuscular, Once, Joycelyn Man M, [name]  Observations/Objective: Patient is well-developed, well-nourished in no acute distress.  Resting comfortably at home.  Head is normocephalic, atraumatic.  No labored breathing.  Speech is clear and coherent with logical content.  Patient is alert and oriented at baseline.     Assessment and Plan: 1. Non-recurrent acute suppurative otitis media of right ear without spontaneous rupture of tympanic membrane - doxycycline (VIBRA-TABS) 100 MG tablet; Take 1 tablet (100 mg total) by mouth 2 (two) times daily.  Dispense: 14 tablet; Refill: 0 - Worsening symptoms that have not responded to OTC medications.  - Will give doxycycline as below.  - Stay well hydrated and get plenty of rest.  - Seek in person evaluation if no symptom improvement or if symptoms worsen.  Follow Up Instructions: I discussed the assessment and treatment plan with the patient. The patient was provided an opportunity to ask questions and all were answered. The patient agreed with the plan and demonstrated an understanding of the instructions.  A copy of instructions were sent to the patient via MyChart.  The patient was advised to call back or seek an in-person evaluation if the symptoms worsen or if the condition fails to improve as anticipated.  Time:  I spent 8 minutes with the patient via telehealth technology discussing the above problems/concerns.    Margaretann Loveless, [name]

## 2020-12-14 NOTE — Patient Instructions (Signed)
Otitis Media, Adult  Otitis media is a condition in which the middle ear is red and swollen (inflamed) and full of fluid. The middle ear is the part of the ear that contains bones for hearing as well as air that helps send sounds to the brain. The conditionusually goes away on its own. What are the causes? This condition is caused by a blockage in the eustachian tube. The eustachian tube connects the middle ear to the back of the nose. It normally allows air into the middle ear. The blockage is caused by fluid or swelling. Problems that can cause blockage include: A cold or infection that affects the nose, mouth, or throat. Allergies. An irritant, such as tobacco smoke. Adenoids that have become large. The adenoids are soft tissue located in the back of the throat, behind the nose and the roof of the mouth. Growth or swelling in the upper part of the throat, just behind the nose (nasopharynx). Damage to the ear caused by change in pressure. This is called barotrauma. What are the signs or symptoms? Symptoms of this condition include: Ear pain. Fever. Problems with hearing. Being tired. Fluid leaking from the ear. Ringing in the ear. How is this treated? This condition can go away on its own within 3-5 days. But if the condition is caused by bacteria or does not go away on its own, or if it keeps coming back, your doctor may: Give you antibiotic medicines. Give you medicines for pain. Follow these instructions at home: Take over-the-counter and prescription medicines only as told by your doctor. If you were prescribed an antibiotic medicine, take it as told by your doctor. Do not stop taking the antibiotic even if you start to feel better. Keep all follow-up visits as told by your doctor. This is important. Contact a doctor if: You have bleeding from your nose. There is a lump on your neck. You are not feeling better in 5 days. You feel worse instead of better. Get help right away  if: You have pain that is not helped with medicine. You have swelling, redness, or pain around your ear. You get a stiff neck. You cannot move part of your face (paralysis). You notice that the bone behind your ear hurts when you touch it. You get a very bad headache. Summary Otitis media means that the middle ear is red, swollen, and full of fluid. This condition usually goes away on its own. If the problem does not go away, treatment may be needed. You may be given medicines to treat the infection or to treat your pain. If you were prescribed an antibiotic medicine, take it as told by your doctor. Do not stop taking the antibiotic even if you start to feel better. Keep all follow-up visits as told by your doctor. This is important. This information is not intended to replace advice given to you by your health care provider. Make sure you discuss any questions you have with your healthcare provider. Document Revised: 04/11/2019 Document Reviewed: 04/11/2019 Elsevier Patient Education  2022 [employer]  

## 2020-12-21 ENCOUNTER — Ambulatory Visit: Payer: Managed Care, Other (non HMO) | Admitting: Family Medicine

## 2020-12-21 ENCOUNTER — Encounter: Payer: Self-pay | Admitting: Family Medicine

## 2020-12-21 ENCOUNTER — Other Ambulatory Visit: Payer: Self-pay

## 2020-12-21 VITALS — BP 133/95 | HR 91 | Ht 60.0 in | Wt 355.0 lb

## 2020-12-21 DIAGNOSIS — Z6841 Body Mass Index (BMI) 40.0 and over, adult: Secondary | ICD-10-CM

## 2020-12-21 DIAGNOSIS — Z3042 Encounter for surveillance of injectable contraceptive: Secondary | ICD-10-CM | POA: Diagnosis not present

## 2020-12-21 DIAGNOSIS — H60501 Unspecified acute noninfective otitis externa, right ear: Secondary | ICD-10-CM

## 2020-12-21 DIAGNOSIS — H66001 Acute suppurative otitis media without spontaneous rupture of ear drum, right ear: Secondary | ICD-10-CM | POA: Diagnosis not present

## 2020-12-21 DIAGNOSIS — B373 Candidiasis of vulva and vagina: Secondary | ICD-10-CM

## 2020-12-21 DIAGNOSIS — B3731 Acute candidiasis of vulva and vagina: Secondary | ICD-10-CM

## 2020-12-21 DIAGNOSIS — E559 Vitamin D deficiency, unspecified: Secondary | ICD-10-CM

## 2020-12-21 MED ORDER — DOXYCYCLINE HYCLATE 100 MG PO TABS: 100.0000 mg | ORAL_TABLET | Freq: Two times a day (BID) | ORAL | 0 refills | Status: AC

## 2020-12-21 MED ORDER — NEOMYCIN-POLYMYXIN-HC 3.5-10000-1 OT SOLN: 3.0000 [drp] | Freq: Four times a day (QID) | OTIC | 0 refills | Status: AC

## 2020-12-21 MED ORDER — FLUCONAZOLE 150 MG PO TABS: 150.0000 mg | ORAL_TABLET | Freq: Once | ORAL | 0 refills | Status: AC

## 2020-12-21 MED ORDER — SAXENDA 18 MG/3ML ~~LOC~~ SOPN: 0.6000 mg | PEN_INJECTOR | Freq: Every day | SUBCUTANEOUS | 5 refills | Status: DC

## 2020-12-21 NOTE — Progress Notes (Signed)
Established patient visit   Patient: [name]   DOB: Mar 16, 1986   35 y.o. Female  MRN: [id number] Visit Date: 12/21/2020  Today's healthcare provider: Mila Merry, MD   Chief Complaint  Patient presents with   Obesity   Ear Pain   Subjective    Otalgia  There is pain in the right ear. This is a new problem. The current episode started in the past 7 days. The problem has been gradually improving. There has been no fever. Associated symptoms include hearing loss and a sore throat. Pertinent negatives include no abdominal pain, ear discharge, rhinorrhea or vomiting. She has tried antibiotics for the symptoms.  She was prescribed doxycycline for this last week via virtual visit and is improving, but is about done with the antibiotic and sx not quite resolved. Pt also needs a refill for diflucan for a yeast infection.   Follow up for Weight Management:  The patient was last seen for this 6 months ago. Changes made at last visit include restarting Saxenda, however she states she was never able to get it filled. She did take it for a few months last year and felt it was helping, but she had some nausea associated with medication. She would like to start back on Saxenda.  -----------------------------------------------------------------------------------------  Follow up for Encounter for surveillance of injectable contraceptive:  The patient was last seen for this 3 months ago. Changes made at last visit include; Patient should return for next dose between 12/16/2020 through 12/30/2020.   -----------------------------------------------------------------------------------------    Medications: Outpatient Medications Prior to Visit  Medication Sig   albuterol (VENTOLIN HFA) 108 (90 Base) MCG/ACT inhaler Inhale 2 puffs into the lungs every 6 (six) hours as needed for wheezing or shortness of breath.   doxycycline (VIBRA-TABS) 100 MG tablet Take 1 tablet (100 mg total) by  mouth 2 (two) times daily.   FLUoxetine (PROZAC) 20 MG tablet Take 1 tablet (20 mg total) by mouth daily.   medroxyPROGESTERone (DEPO-PROVERA) 150 MG/ML injection Inject 150 mg into the muscle every 3 (three) months.   metroNIDAZOLE (METROGEL) 0.75 % vaginal gel Place 1 Applicatorful vaginally 2 (two) times daily as needed.   montelukast (SINGULAIR) 10 MG tablet Take 1 tablet (10 mg total) by mouth at bedtime as needed.   nystatin cream (MYCOSTATIN) Apply 1 application topically 2 (two) times daily as needed for dry skin.   omeprazole (PRILOSEC) 40 MG capsule Take 1 capsule (40 mg total) daily by mouth.   ondansetron (ZOFRAN) 4 MG tablet Take 1 tablet (4 mg total) by mouth every 8 (eight) hours as needed.   potassium chloride (KLOR-CON) 10 MEQ tablet Take 1 tablet (10 mEq total) by mouth daily.   promethazine-dextromethorphan (PROMETHAZINE-DM) 6.25-15 MG/5ML syrup Take 5 mLs by mouth 4 (four) times daily as needed for cough.   propranolol (INDERAL) 20 MG tablet Take 1 tablet (20 mg total) by mouth 3 (three) times daily as needed. For palpitations   tiZANidine (ZANAFLEX) 4 MG tablet 1/2-1 q HS prn   torsemide (DEMADEX) 20 MG tablet Take 1 tablet (20 mg total) by mouth daily. May add second pill if needed   Vitamin D, Ergocalciferol, (DRISDOL) 1.25 MG (50000 UNIT) CAPS capsule Take 1 capsule (50,000 Units total) by mouth every 7 (seven) days.   fluticasone-salmeterol (ADVAIR DISKUS) 250-50 MCG/ACT AEPB Inhale 1 puff into the lungs 2 (two) times daily.   Insulin Pen Needle (BD PEN NEEDLE NANO 2ND GEN) 32G X 4 MM  MISC To use daily with saxenda injection (Patient not taking: Reported on 12/21/2020)   Liraglutide -Weight Management (SAXENDA) 18 MG/3ML SOPN Inject 0.1 mLs (0.6 mg total) into the skin daily. Increase by 0.6mg  weekly until 3mg  daily achieved. (Patient not taking: Reported on 12/21/2020)   Facility-Administered Medications Prior to Visit  Medication Dose Route Frequency Provider   bupivacaine  (MARCAINE) 0.5 % (with pres) injection 3 mL  3 mL Other Once 02/20/2021, MD   medroxyPROGESTERone (DEPO-PROVERA) injection 150 mg  150 mg Intramuscular Once Tyrell Antonio M, [name]    Review of Systems  Constitutional:  Negative for appetite change, chills, fatigue and fever.  HENT:  Positive for ear pain, hearing loss and sore throat. Negative for congestion, ear discharge, postnasal drip, rhinorrhea, sinus pressure, sinus pain and tinnitus.   Respiratory:  Negative for chest tightness and shortness of breath.   Cardiovascular:  Negative for chest pain and palpitations.  Gastrointestinal:  Negative for abdominal pain, nausea and vomiting.  Neurological:  Negative for dizziness and weakness.      Objective    BP (!) 133/95 (BP Location: Right Wrist, Patient Position: Sitting, Cuff Size: Normal)   Pulse 91   Ht 5' (1.524 m)   Wt (!) 355 lb (161 kg)   SpO2 100%   BMI 69.33 kg/m     Physical Exam  General Appearance:    Severely obese female, alert, cooperative, in no acute distress  HENT:   right TM red, dull, bulging. Ear canal is also inflamed.   Eyes:    PERRL, conjunctiva/corneas clear, EOM's intact       Lungs:     Clear to auscultation bilaterally, respirations unlabored  Heart:    Normal heart rate. Normal rhythm. No murmurs, rubs, or gallops.   Neurologic:   Awake, alert, oriented x 3. No apparent focal neurological           defect.        Assessment & Plan     1. Morbid obesity with body mass index (BMI) of 50.0 to 59.9 in adult [facility]) Start back on  Liraglutide -Weight Management (SAXENDA) 18 MG/3ML SOPN; Inject 0.6 mg into the skin daily. Increase by 0.6mg  weekly until 3mg  daily achieved.  Dispense: 15 mL; Refill: 5  If she experiences nausea again advised to take a slower titration schedule  - Comprehensive metabolic panel - TSH  2. Non-recurrent acute suppurative otitis media of right ear without spontaneous rupture of tympanic membrane Improved but  not resolved after completing 5 of 7 days of doxycycline. refill- doxycycline (VIBRA-TABS) 100 MG tablet; Take 1 tablet (100 mg total) by mouth 2 (two) times daily for 10 days.  Dispense: 20 tablet; Refill: 0  3. Acute otitis externa of right ear, unspecified type - neomycin-polymyxin-hydrocortisone (CORTISPORIN) OTIC solution; Place 3 drops into the right ear 4 (four) times daily for 7 days.  Dispense: 10 mL; Refill: 0   4. Yeast vaginitis  - fluconazole (DIFLUCAN) 150 MG tablet; Take 1 tablet (150 mg total) by mouth once for 1 dose. Repeat in 1 week  Dispense: 2 tablet; Refill: 0  5. Vitamin D deficiency  - VITAMIN D 25 Hydroxy (Vit-D Deficiency, Fractures)  Depo-provera administered today. Is going to follow up and establish with [facility] at next visit.   Future Appointments  Date Time Provider Department Center  03/11/2021  9:40 AM Merita Norton, FNP [facility]         The entirety of the  information documented in the History of Present Illness, Review of Systems and Physical Exam were personally obtained by me. Portions of this information were initially documented by the CMA and reviewed by me for thoroughness and accuracy.     Mila Merry, MD  [facility] [phone] (phone) [phone] (fax)  [facility]

## 2020-12-22 LAB — COMPREHENSIVE METABOLIC PANEL
ALT: 12 IU/L (ref 0–32)
AST: 13 IU/L (ref 0–40)
Albumin/Globulin Ratio: 1.5 (ref 1.2–2.2)
Albumin: 3.9 g/dL (ref 3.8–4.8)
Alkaline Phosphatase: 94 IU/L (ref 44–121)
BUN/Creatinine Ratio: 10 (ref 9–23)
BUN: 8 mg/dL (ref 6–20)
Bilirubin Total: 0.3 mg/dL (ref 0.0–1.2)
CO2: 22 mmol/L (ref 20–29)
Calcium: 9.5 mg/dL (ref 8.7–10.2)
Chloride: 105 mmol/L (ref 96–106)
Creatinine, Ser: 0.82 mg/dL (ref 0.57–1.00)
Globulin, Total: 2.6 g/dL (ref 1.5–4.5)
Glucose: 97 mg/dL (ref 65–99)
Potassium: 5 mmol/L (ref 3.5–5.2)
Sodium: 140 mmol/L (ref 134–144)
Total Protein: 6.5 g/dL (ref 6.0–8.5)
eGFR: 95 mL/min/{1.73_m2} (ref >59)

## 2020-12-22 LAB — VITAMIN D 25 HYDROXY (VIT D DEFICIENCY, FRACTURES): Vit D, 25-Hydroxy: 13.9 ng/mL — ABNORMAL LOW (ref 30.0–100.0)

## 2020-12-22 LAB — TSH: TSH: 2.8 u[IU]/mL (ref 0.450–4.500)

## 2020-12-22 MED ORDER — MEDROXYPROGESTERONE ACETATE 150 MG/ML IM SUSP
150.0000 mg | Freq: Once | INTRAMUSCULAR | Status: AC
  Administered 2020-12-21: 150 mg via INTRAMUSCULAR

## 2020-12-22 NOTE — Addendum Note (Signed)
Addended by: Kavin Leech E on: 12/22/2020 04:31 PM   Modules accepted: Orders

## 2021-01-12 ENCOUNTER — Telehealth: Payer: Self-pay | Admitting: Family Medicine

## 2021-01-12 DIAGNOSIS — Z6841 Body Mass Index (BMI) 40.0 and over, adult: Secondary | ICD-10-CM

## 2021-01-12 NOTE — Telephone Encounter (Signed)
Pt advised.  She would like to try [facility].  Please send to [facility] [address]   Thanks,   -Vernona Rieger

## 2021-01-12 NOTE — Telephone Encounter (Signed)
Please advise patient that her insurance will not cover Saxenda since she did not loose at least 4% of her bodyweight when she took it last year. They might cover [facility] which is a once a week injection. If she'd like to try it I can send in prescription and try to get it approved.

## 2021-01-13 MED ORDER — WEGOVY 0.25 MG/0.5ML ~~LOC~~ SOAJ: 0.2500 mg | SUBCUTANEOUS | 0 refills | Status: DC

## 2021-02-09 ENCOUNTER — Encounter: Payer: Self-pay | Admitting: Family Medicine

## 2021-02-10 NOTE — Telephone Encounter (Signed)
Pt called and is requesting to have this medication sent in. She is requesting to have a call back as to when she should be expecting this to be sent in. Please advise.

## 2021-02-10 NOTE — Telephone Encounter (Signed)
Prescription was sent into [facility] pharmacy ([address]) on 01/13/2021. I called and advised patient of this.

## 2021-03-11 ENCOUNTER — Ambulatory Visit: Payer: Managed Care, Other (non HMO) | Admitting: Family Medicine

## 2021-03-11 ENCOUNTER — Other Ambulatory Visit: Payer: Self-pay

## 2021-03-11 ENCOUNTER — Encounter: Payer: Self-pay | Admitting: Family Medicine

## 2021-03-11 VITALS — BP 148/97 | HR 93 | Temp 97.6°F

## 2021-03-11 DIAGNOSIS — M25561 Pain in right knee: Secondary | ICD-10-CM | POA: Diagnosis not present

## 2021-03-11 DIAGNOSIS — Z3042 Encounter for surveillance of injectable contraceptive: Secondary | ICD-10-CM | POA: Insufficient documentation

## 2021-03-11 DIAGNOSIS — G8929 Other chronic pain: Secondary | ICD-10-CM | POA: Insufficient documentation

## 2021-03-11 DIAGNOSIS — R7303 Prediabetes: Secondary | ICD-10-CM

## 2021-03-11 DIAGNOSIS — M545 Low back pain, unspecified: Secondary | ICD-10-CM

## 2021-03-11 DIAGNOSIS — M25562 Pain in left knee: Secondary | ICD-10-CM

## 2021-03-11 DIAGNOSIS — R4586 Emotional lability: Secondary | ICD-10-CM

## 2021-03-11 MED ORDER — MELOXICAM 15 MG PO TABS: 15.0000 mg | ORAL_TABLET | Freq: Every day | ORAL | 1 refills | Status: DC

## 2021-03-11 MED ORDER — OZEMPIC (0.25 OR 0.5 MG/DOSE) 2 MG/1.5ML ~~LOC~~ SOPN: 0.5000 mg | PEN_INJECTOR | SUBCUTANEOUS | 10 refills | Status: DC

## 2021-03-11 MED ORDER — CYCLOBENZAPRINE HCL 10 MG PO TABS
10.0000 mg | ORAL_TABLET | Freq: Three times a day (TID) | ORAL | 1 refills | Status: DC | PRN
Start: 2021-03-11 — End: 2023-06-15

## 2021-03-11 MED ORDER — MEDROXYPROGESTERONE ACETATE 150 MG/ML IM SUSP
150.0000 mg | Freq: Once | INTRAMUSCULAR | Status: AC
  Administered 2021-03-11: 150 mg via INTRAMUSCULAR

## 2021-03-11 MED ORDER — METFORMIN HCL ER 500 MG PO TB24: 1000.0000 mg | ORAL_TABLET | Freq: Two times a day (BID) | ORAL | 0 refills | Status: DC

## 2021-03-11 MED ORDER — BUPROPION HCL ER (XL) 150 MG PO TB24: 150.0000 mg | ORAL_TABLET | Freq: Every day | ORAL | 0 refills | Status: DC

## 2021-03-11 NOTE — Progress Notes (Signed)
Established patient visit   Patient: [name]   DOB: Oct 26, 1985   35 y.o. Female  MRN: [id number] Visit Date: 03/11/2021  Today's healthcare provider: [name], FNP   Chief Complaint  Patient presents with   Ear Pain   Skin Problem    Patient reports concerns of cracking of her skin in between her fold of arms, elbows and knees that she has noticed over hte past 2 weeks.    Knee Pain   Obesity    Patient would like to discuss medication to aide with weight loss, patient states that weight loss surgery is no longer a option for her and states that she was placed previously on Wegovy but states that she was told it would be off the market until 2023.   Due to time constraints of appt; pt agreed to discuss weight loss medications and back/knee pain and will reschedule/make another appt to discuss other concerns.   Subjective    Otalgia  There is pain in the right ear. This is a recurrent problem. The current episode started more than 1 month ago. The problem has been unchanged. There has been no fever. The pain is at a severity of 6/10. Pertinent negatives include no abdominal pain, coughing, diarrhea, ear discharge, headaches, hearing loss, neck pain, rash, rhinorrhea or sore throat. She has tried antibiotics for the symptoms. The treatment provided moderate relief.  Knee Pain  There was no injury mechanism. The pain is present in the left knee. The quality of the pain is described as aching, shooting and stabbing. The pain has been Constant since onset. Associated symptoms include muscle weakness. Pertinent negatives include no inability to bear weight, loss of motion, loss of sensation, numbness or tingling. She reports no foreign bodies present. The symptoms are aggravated by weight bearing and palpation. She has tried acetaminophen and NSAIDs for the symptoms. The treatment provided no relief.  HPI     Skin Problem    Additional comments: Patient reports concerns of  cracking of her skin in between her fold of arms, elbows and knees that she has noticed over hte past 2 weeks.         Obesity    Additional comments: Patient would like to discuss medication to aide with weight loss, patient states that weight loss surgery is no longer a option for her and states that she was placed previously on Wegovy but states that she was told it would be off the market until 2023.      Last edited by Fonda Kinder, CMA on 03/11/2021  9:59 AM.       Medications: Outpatient Medications Prior to Visit  Medication Sig   albuterol (VENTOLIN HFA) 108 (90 Base) MCG/ACT inhaler Inhale 2 puffs into the lungs every 6 (six) hours as needed for wheezing or shortness of breath.   doxycycline (VIBRA-TABS) 100 MG tablet Take 1 tablet (100 mg total) by mouth 2 (two) times daily.   FLUoxetine (PROZAC) 20 MG tablet Take 1 tablet (20 mg total) by mouth daily.   Insulin Pen Needle (BD PEN NEEDLE NANO 2ND GEN) 32G X 4 MM MISC To use daily with saxenda injection   medroxyPROGESTERone (DEPO-PROVERA) 150 MG/ML injection Inject 150 mg into the muscle every 3 (three) months.   metroNIDAZOLE (METROGEL) 0.75 % vaginal gel Place 1 Applicatorful vaginally 2 (two) times daily as needed.   montelukast (SINGULAIR) 10 MG tablet Take 1 tablet (10 mg total) by mouth  at bedtime as needed.   nystatin cream (MYCOSTATIN) Apply 1 application topically 2 (two) times daily as needed for dry skin.   omeprazole (PRILOSEC) 40 MG capsule Take 1 capsule (40 mg total) daily by mouth.   ondansetron (ZOFRAN) 4 MG tablet Take 1 tablet (4 mg total) by mouth every 8 (eight) hours as needed.   potassium chloride (KLOR-CON) 10 MEQ tablet Take 1 tablet (10 mEq total) by mouth daily.   promethazine-dextromethorphan (PROMETHAZINE-DM) 6.25-15 MG/5ML syrup Take 5 mLs by mouth 4 (four) times daily as needed for cough.   propranolol (INDERAL) 20 MG tablet Take 1 tablet (20 mg total) by mouth 3 (three) times daily as  needed. For palpitations   Semaglutide-Weight Management (WEGOVY) 0.25 MG/0.5ML SOAJ Inject 0.25 mg into the skin once a week.   tiZANidine (ZANAFLEX) 4 MG tablet 1/2-1 q HS prn   torsemide (DEMADEX) 20 MG tablet Take 1 tablet (20 mg total) by mouth daily. May add second pill if needed   Vitamin D, Ergocalciferol, (DRISDOL) 1.25 MG (50000 UNIT) CAPS capsule Take 1 capsule (50,000 Units total) by mouth every 7 (seven) days.   fluticasone-salmeterol (ADVAIR DISKUS) 250-50 MCG/ACT AEPB Inhale 1 puff into the lungs 2 (two) times daily.   Facility-Administered Medications Prior to Visit  Medication Dose Route Frequency Provider   bupivacaine (MARCAINE) 0.5 % (with pres) injection 3 mL  3 mL Other Once Tyrell Antonio, MD   medroxyPROGESTERone (DEPO-PROVERA) injection 150 mg  150 mg Intramuscular Once Margaretann Loveless, [name]    Review of Systems  HENT:  Positive for ear pain. Negative for ear discharge, hearing loss, rhinorrhea and sore throat.   Respiratory:  Negative for cough.   Gastrointestinal:  Negative for abdominal pain and diarrhea.  Musculoskeletal:  Negative for neck pain.  Skin:  Negative for rash.  Neurological:  Negative for tingling, numbness and headaches.      Objective    BP (!) 148/97   Pulse 93   Temp 97.6 F (36.4 C) (Oral)  {Show previous vital signs (optional):23777}  Physical Exam Vitals and nursing note reviewed.  Constitutional:      General: She is not in acute distress.    Appearance: Normal appearance. She is obese. She is not ill-appearing, toxic-appearing or diaphoretic.  HENT:     Head: Normocephalic and atraumatic.  Cardiovascular:     Rate and Rhythm: Normal rate and regular rhythm.     Pulses: Normal pulses.     Heart sounds: Normal heart sounds. No murmur heard.   No friction rub. No gallop.  Pulmonary:     Effort: Pulmonary effort is normal. No respiratory distress.     Breath sounds: Normal breath sounds. No stridor. No wheezing, rhonchi  or rales.  Chest:     Chest wall: No tenderness.  Abdominal:     General: Bowel sounds are normal.     Palpations: Abdomen is soft.  Musculoskeletal:        General: No swelling, tenderness, deformity or signs of injury. Normal range of motion.     Right lower leg: No edema.     Left lower leg: No edema.  Skin:    General: Skin is warm and dry.     Capillary Refill: Capillary refill takes less than 2 seconds.     Coloration: Skin is not jaundiced or pale.     Findings: No bruising, erythema, lesion or rash.  Neurological:     General: No focal deficit present.  Mental Status: She is alert and oriented to person, place, and time. Mental status is at baseline.     Cranial Nerves: No cranial nerve deficit.     Sensory: No sensory deficit.     Motor: No weakness.     Coordination: Coordination normal.  Psychiatric:        Mood and Affect: Mood normal.        Behavior: Behavior normal.        Thought Content: Thought content normal.        Judgment: Judgment normal.     No results found for any visits on 03/11/21.  Assessment & Plan     Problem List Items Addressed This Visit       Other   Chronic low back pain   Relevant Medications   buPROPion (WELLBUTRIN XL) 150 MG 24 hr tablet   meloxicam (MOBIC) 15 MG tablet   cyclobenzaprine (FLEXERIL) 10 MG tablet   Chronic pain of both knees   Relevant Medications   buPROPion (WELLBUTRIN XL) 150 MG 24 hr tablet   meloxicam (MOBIC) 15 MG tablet   cyclobenzaprine (FLEXERIL) 10 MG tablet   Encounter for surveillance of injectable contraceptive - Primary   Other Visit Diagnoses     Prediabetes       Relevant Medications   metFORMIN (GLUCOPHAGE XR) 500 MG 24 hr tablet   Mood swings       Relevant Medications   buPROPion (WELLBUTRIN XL) 150 MG 24 hr tablet        Return in about 4 weeks (around 04/08/2021) for anxiety and depression, chonic disease management.      Leilani Merl, FNP, have reviewed all documentation  for this visit. The documentation on 03/11/21 for the exam, diagnosis, procedures, and orders are all accurate and complete.    [name], FNP  [facility] [phone] (phone) [phone] (fax)  [facility]

## 2021-04-02 ENCOUNTER — Ambulatory Visit: Payer: Self-pay | Admitting: *Deleted

## 2021-04-02 ENCOUNTER — Other Ambulatory Visit: Payer: Self-pay

## 2021-04-02 DIAGNOSIS — J45909 Unspecified asthma, uncomplicated: Secondary | ICD-10-CM | POA: Insufficient documentation

## 2021-04-02 DIAGNOSIS — E876 Hypokalemia: Secondary | ICD-10-CM | POA: Diagnosis not present

## 2021-04-02 DIAGNOSIS — Z79899 Other long term (current) drug therapy: Secondary | ICD-10-CM | POA: Insufficient documentation

## 2021-04-02 DIAGNOSIS — F17290 Nicotine dependence, other tobacco product, uncomplicated: Secondary | ICD-10-CM | POA: Insufficient documentation

## 2021-04-02 DIAGNOSIS — I5032 Chronic diastolic (congestive) heart failure: Secondary | ICD-10-CM | POA: Insufficient documentation

## 2021-04-02 DIAGNOSIS — K529 Noninfective gastroenteritis and colitis, unspecified: Secondary | ICD-10-CM | POA: Diagnosis not present

## 2021-04-02 DIAGNOSIS — R112 Nausea with vomiting, unspecified: Secondary | ICD-10-CM | POA: Diagnosis present

## 2021-04-02 DIAGNOSIS — Z20822 Contact with and (suspected) exposure to covid-19: Secondary | ICD-10-CM | POA: Insufficient documentation

## 2021-04-02 DIAGNOSIS — N9489 Other specified conditions associated with female genital organs and menstrual cycle: Secondary | ICD-10-CM | POA: Diagnosis not present

## 2021-04-02 DIAGNOSIS — E119 Type 2 diabetes mellitus without complications: Secondary | ICD-10-CM | POA: Diagnosis not present

## 2021-04-02 DIAGNOSIS — Z7951 Long term (current) use of inhaled steroids: Secondary | ICD-10-CM | POA: Diagnosis not present

## 2021-04-02 DIAGNOSIS — Z7984 Long term (current) use of oral hypoglycemic drugs: Secondary | ICD-10-CM | POA: Insufficient documentation

## 2021-04-02 DIAGNOSIS — I110 Hypertensive heart disease with heart failure: Secondary | ICD-10-CM | POA: Diagnosis not present

## 2021-04-02 LAB — COMPREHENSIVE METABOLIC PANEL
ALT: 10 U/L (ref 0–44)
AST: 14 U/L — ABNORMAL LOW (ref 15–41)
Albumin: 3.6 g/dL (ref 3.5–5.0)
Alkaline Phosphatase: 61 U/L (ref 38–126)
Anion gap: 7 (ref 5–15)
BUN: 6 mg/dL (ref 6–20)
CO2: 25 mmol/L (ref 22–32)
Calcium: 8.9 mg/dL (ref 8.9–10.3)
Chloride: 105 mmol/L (ref 98–111)
Creatinine, Ser: 0.88 mg/dL (ref 0.44–1.00)
GFR, Estimated: >60 mL/min (ref >60)
Glucose, Bld: 106 mg/dL — ABNORMAL HIGH (ref 70–99)
Potassium: 3.4 mmol/L — ABNORMAL LOW (ref 3.5–5.1)
Sodium: 137 mmol/L (ref 135–145)
Total Bilirubin: 0.6 mg/dL (ref 0.3–1.2)
Total Protein: 7.5 g/dL (ref 6.5–8.1)

## 2021-04-02 LAB — CBC WITH DIFFERENTIAL/PLATELET
Abs Immature Granulocytes: 0.01 10*3/uL (ref 0.00–0.07)
Basophils Absolute: 0 10*3/uL (ref 0.0–0.1)
Basophils Relative: 0 %
Eosinophils Absolute: 0.2 10*3/uL (ref 0.0–0.5)
Eosinophils Relative: 4 %
HCT: 37 % (ref 36.0–46.0)
Hemoglobin: 12.5 g/dL (ref 12.0–15.0)
Immature Granulocytes: 0 %
Lymphocytes Relative: 31 %
Lymphs Abs: 1.7 10*3/uL (ref 0.7–4.0)
MCH: 30.7 pg (ref 26.0–34.0)
MCHC: 33.8 g/dL (ref 30.0–36.0)
MCV: 90.9 fL (ref 80.0–100.0)
Monocytes Absolute: 0.6 10*3/uL (ref 0.1–1.0)
Monocytes Relative: 10 %
Neutro Abs: 3 10*3/uL (ref 1.7–7.7)
Neutrophils Relative %: 55 %
Platelets: 282 10*3/uL (ref 150–400)
RBC: 4.07 MIL/uL (ref 3.87–5.11)
RDW: 13.8 % (ref 11.5–15.5)
WBC: 5.5 10*3/uL (ref 4.0–10.5)
nRBC: 0 % (ref 0.0–0.2)

## 2021-04-02 LAB — RESP PANEL BY RT-PCR (FLU A&B, COVID) ARPGX2
Influenza A by PCR: NEGATIVE
Influenza B by PCR: NEGATIVE
SARS Coronavirus 2 by RT PCR: NEGATIVE

## 2021-04-02 NOTE — ED Triage Notes (Signed)
Pt presents to ED with instruction from PCP due to N/V/D since this past Tuesday. Pt states lower ABD pain that is sharp in nature.   Pt denies fevers or chills. Pt denies urinary symptoms at this time. NAD noted in triage.

## 2021-04-02 NOTE — Telephone Encounter (Signed)
Reason for Disposition  [1] MILD-MODERATE pain AND [2] constant AND [3] present > 2 hours  Answer Assessment - Initial Assessment Questions 1. LOCATION: "Where does it hurt?"      Pt calling in.    Pain towards bottom of stomach in the middle towards the right side.  But also across the whole bottom 2. RADIATION: "Does the pain shoot anywhere else?" (e.g., chest, back)     No 3. ONSET: "When did the pain begin?" (e.g., minutes, hours or days ago)      Tuesday night.   I did Ozempic this past Monday and then I started vomiting Tuesday night.   Then the vomiting stopped Wed. Evening and now I'm having diarrhea.  I'm burping real bad a burning burps.    I've lost 7 lbs since Tuesday.  I'm barely eating anything.   Nothing solid.  Having bad stomach pains.   I can't even sleep due to the pain.   Had diarrhea since Thursday.    I've been on the Ozempic 2-3 weeks ow.   No problem until now.    4. SUDDEN: "Gradual or sudden onset?"     It started gradually and the vomiting started and the pain has gotten worse.    My stomach is grumbling and painful then have diarrhea. 5. PATTERN "Does the pain come and go, or is it constant?"    - If constant: "Is it getting better, staying the same, or worsening?"      (Note: Constant means the pain never goes away completely; most serious pain is constant and it progresses)     - If intermittent: "How long does it last?" "Do you have pain now?"     (Note: Intermittent means the pain goes away completely between bouts)     Diarrhea since Thur. Almost every hour and it's pure liquid.  I'm trying to drink Gator Aid since yesterday but it makes me have nausea. 6. SEVERITY: "How bad is the pain?"  (e.g., Scale 1-10; mild, moderate, or severe)   - MILD (1-3): doesn't interfere with normal activities, abdomen soft and not tender to touch    - MODERATE (4-7): interferes with normal activities or awakens from sleep, abdomen tender to touch    - SEVERE (8-10): excruciating  pain, doubled over, unable to do any normal activities      Abd pain sharp pains 10/10.   Now it's 7-8/10 pain scale 7. RECURRENT SYMPTOM: "Have you ever had this type of stomach pain before?" If Yes, ask: "When was the last time?" and "What happened that time?"      No    This feels different than a stomach bug. 8. CAUSE: "What do you think is causing the stomach pain?"     I don't know.   If it's the Ozempic I would think it would stop by now. 9. RELIEVING/AGGRAVATING FACTORS: "What makes it better or worse?" (e.g., movement, antacids, bowel movement)     Tums and Mylanta and it did not help 10. OTHER SYMPTOMS: "Do you have any other symptoms?" (e.g., back pain, diarrhea, fever, urination pain, vomiting)      97.2 now so no fever.   No urinary symptoms. 11. PREGNANCY: "Is there any chance you are pregnant?" "When was your last menstrual period?"       On Depo birth control  Protocols used: Abdominal Pain - [facility]

## 2021-04-02 NOTE — Telephone Encounter (Signed)
Pt called in c/o abd pain in her lower abd all the way across the bottom.   She's had vomiting and is now having hourly liquid diarrhea.   She took her Ozempic injection Monday and the vomiting started Tuesday.   She has not taken her metformin or Wellbutrin since Tuesday morning due to vomiting.  She has lost 7 lbs since Tuesday.  There are no appts available at [facility].   I suggested she go on to the ED due to the hourly diarrhea and the length of time she has been sick with vomiting and diarrhea.   Pt was agreeable and is going to [facility].  See triage notes.  I sent my notes to [facility]

## 2021-04-03 ENCOUNTER — Emergency Department
Admission: EM | Admit: 2021-04-03 | Discharge: 2021-04-03 | Disposition: A | Discharge status: Home / Self Care | Payer: Managed Care, Other (non HMO) | Attending: Emergency Medicine | Admitting: Emergency Medicine

## 2021-04-03 DIAGNOSIS — E876 Hypokalemia: Secondary | ICD-10-CM

## 2021-04-03 DIAGNOSIS — K529 Noninfective gastroenteritis and colitis, unspecified: Secondary | ICD-10-CM

## 2021-04-03 HISTORY — DX: Type 2 diabetes mellitus without complications: E11.9

## 2021-04-03 LAB — HCG, QUANTITATIVE, PREGNANCY: hCG, Beta Chain, Quant, S: <1 m[IU]/mL (ref <5)

## 2021-04-03 MED ORDER — POTASSIUM CHLORIDE CRYS ER 20 MEQ PO TBCR
40.0000 meq | EXTENDED_RELEASE_TABLET | Freq: Once | ORAL | Status: AC
  Administered 2021-04-03: 40 meq via ORAL
  Filled 2021-04-03: qty 2

## 2021-04-03 MED ORDER — ONDANSETRON 4 MG PO TBDP: 4.0000 mg | ORAL_TABLET | Freq: Three times a day (TID) | ORAL | 0 refills | Status: DC | PRN

## 2021-04-03 MED ORDER — ONDANSETRON 4 MG PO TBDP
4.0000 mg | ORAL_TABLET | Freq: Once | ORAL | Status: AC
  Administered 2021-04-03: 4 mg via ORAL
  Filled 2021-04-03: qty 1

## 2021-04-03 NOTE — ED Provider Notes (Signed)
[facility] Emergency Department Provider Note  ____________________________________________   Event Date/Time   First MD Initiated Contact with Patient 04/03/21 0028     (approximate)  I have reviewed the triage vital signs and the nursing notes.   HISTORY  Chief Complaint Emesis and Nausea    HPI [name] is a 35 y.o. female with diabetes, hypertension who was recently started on semaglutide, metformin for weight loss, prediabetes who comes in with concerns for nausea, vomiting, diarrhea.  Patient reports 3 days of symptoms.  She states it started after eating a sandwich that was a leftover sandwich.  She reports having some lower abdominal pain prior to episodes of diarrhea or nausea and vomiting but no abdominal pain when she does not have to have the diarrhea or nausea.  She reports that she only gets the abdominal pain diarrhea and nausea after eating and that her symptoms are already getting better.  She reports it mostly comes on after eating, nothing makes it better.  She reports a foul taste in her mouth as well.  She denies any fevers, chills, urinary symptoms or any other concerns.  She reports only 3 episodes of diarrhea today and they all occurred after eating.  She denies any recent travel, drinking like water or any other concerns.  She does not work around people with C. difficile.  She last used antibiotics over 2 months ago.      Past Medical History:  Diagnosis Date   Anxiety    Depression    Diabetes mellitus without complication ([place])    Elevated blood pressure    GERD (gastroesophageal reflux disease)    Hypertension    Sleep apnea     Patient Active Problem List   Diagnosis Date Noted   Chronic pain of both knees 03/11/2021   Encounter for surveillance of injectable contraceptive 03/11/2021   Chronic diastolic CHF (congestive heart failure) ([place]) 08/08/2018   Depression, major, single episode, mild ([place]) 08/08/2018    Asthma exacerbation 12/28/2017   Benign essential HTN 05/04/2016   History of pre-eclampsia 02/12/2015   Abnormal vaginal Pap smear 02/05/2015   Abortion, spontaneous 02/05/2015   Anxiety 02/05/2015   LBP (low back pain) 02/05/2015   Fatigue 02/05/2015   Fibrositis 0000000   Folliculitis 0000000   Acid reflux 02/05/2015   History of diabetes mellitus arising in pregnancy 02/05/2015   High risk sexual behavior 02/05/2015   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/2015   Class 3 severe obesity due to excess calories with serious comorbidity and body mass index (BMI) of 60.0 to 69.9 in adult ([place]) 02/05/2015   Obstructive sleep apnea 02/05/2015   Vitamin D deficiency 02/05/2015   Depression 02/05/2015   Fibromyalgia 02/05/2015   Abnormal C-reactive protein 10/24/2012   Chronic low back pain 08/13/2012   Absence of menstruation 09/22/2009   Migraine without status migrainosus 07/22/2008    Past Surgical History:  Procedure Laterality Date   CESAREAN SECTION     at 28 weeks for pre-eclampsia, pulmonary edema, gestational DM.   dilationand curettage of Uterus  2007   SAB   Headache:2009     Headache wellness consult. during pregnancy    Prior to Admission medications   Medication Sig Start Date End Date Taking? Authorizing Provider  albuterol (VENTOLIN HFA) 108 (90 Base) MCG/ACT inhaler Inhale 2 puffs into the lungs every 6 (six) hours as needed for wheezing or shortness of breath. 10/04/20   Brunetta Jeans, [name]  buPROPion (WELLBUTRIN XL)  150 MG 24 hr tablet Take 1 tablet (150 mg total) by mouth daily. 03/11/21   [name], FNP  cyclobenzaprine (FLEXERIL) 10 MG tablet Take 1 tablet (10 mg total) by mouth 3 (three) times daily as needed for muscle spasms. 03/11/21   [name], FNP  FLUoxetine (PROZAC) 20 MG tablet Take 1 tablet (20 mg total) by mouth daily. 02/19/18   Margaretann Loveless, [name]  fluticasone-salmeterol (ADVAIR DISKUS) 250-50 MCG/ACT AEPB Inhale 1 puff into the lungs  2 (two) times daily. 10/21/20 11/20/20  Malva Limes, MD  Insulin Pen Needle (BD PEN NEEDLE NANO 2ND GEN) 32G X 4 MM MISC To use daily with saxenda injection 07/26/19   Margaretann Loveless, [name]  medroxyPROGESTERone (DEPO-PROVERA) 150 MG/ML injection Inject 150 mg into the muscle every 3 (three) months.    [provider]  meloxicam (MOBIC) 15 MG tablet Take 1 tablet (15 mg total) by mouth daily. 03/11/21   [name], FNP  metFORMIN (GLUCOPHAGE XR) 500 MG 24 hr tablet Take 2 tablets (1,000 mg total) by mouth 2 (two) times daily with breakfast and lunch. 03/11/21   [name], FNP  metroNIDAZOLE (METROGEL) 0.75 % vaginal gel Place 1 Applicatorful vaginally 2 (two) times daily as needed.    [provider]  montelukast (SINGULAIR) 10 MG tablet Take 1 tablet (10 mg total) by mouth at bedtime as needed. 10/04/20   Waldon Merl, [name]  nystatin cream (MYCOSTATIN) Apply 1 application topically 2 (two) times daily as needed for dry skin.    [provider]  omeprazole (PRILOSEC) 40 MG capsule Take 1 capsule (40 mg total) daily by mouth. 03/29/17   Margaretann Loveless, [name]  ondansetron (ZOFRAN) 4 MG tablet Take 1 tablet (4 mg total) by mouth every 8 (eight) hours as needed. 09/16/19   Margaretann Loveless, [name]  potassium chloride (KLOR-CON) 10 MEQ tablet Take 1 tablet (10 mEq total) by mouth daily. 10/23/20   Antonieta Iba, MD  promethazine-dextromethorphan (PROMETHAZINE-DM) 6.25-15 MG/5ML syrup Take 5 mLs by mouth 4 (four) times daily as needed for cough. 10/04/20   Waldon Merl, [name]  propranolol (INDERAL) 20 MG tablet Take 1 tablet (20 mg total) by mouth 3 (three) times daily as needed. For palpitations 03/13/19   Antonieta Iba, MD  Semaglutide,0.25 or 0.5MG /DOS, (OZEMPIC, 0.25 OR 0.5 MG/DOSE,) 2 MG/1.5ML SOPN Inject 0.5 mg into the skin once a week. 03/11/21   [name], FNP  Semaglutide-Weight Management (WEGOVY) 0.25 MG/0.5ML SOAJ Inject 0.25 mg into  the skin once a week. 01/13/21   Malva Limes, MD  tiZANidine (ZANAFLEX) 4 MG tablet 1/2-1 q HS prn 06/07/19   [provider]  torsemide (DEMADEX) 20 MG tablet Take 1 tablet (20 mg total) by mouth daily. May add second pill if needed 07/13/20   Margaretann Loveless, [name]  Vitamin D, Ergocalciferol, (DRISDOL) 1.25 MG (50000 UNIT) CAPS capsule Take 1 capsule (50,000 Units total) by mouth every 7 (seven) days. 01/17/20   Margaretann Loveless, [name]    Allergies Lactose intolerance (gi), Penicillins, and Pineapple  Family History  Problem Relation Age of Onset   Hypertension Maternal Grandmother     Social History Social History   Tobacco Use   Smoking status: Some Days    Types: Cigars   Smokeless tobacco: Never   Tobacco comments:    smokes cigars  Vaping Use   Vaping Use: Never used  Substance Use Topics  Alcohol use: Yes    Alcohol/week: 0.0 standard drinks    Comment: ocassional alcohol use; Mixed drinks once every 2-3 months   Drug use: No      Review of Systems Constitutional: No fever/chills Eyes: No visual changes. ENT: No sore throat. Cardiovascular: Denies chest pain. Respiratory: Denies shortness of breath. Gastrointestinal:   Nausea vomiting diarrhea, abdominal pain when these come on Genitourinary: Negative for dysuria. Musculoskeletal: Negative for back pain. Skin: Negative for rash. Neurological: Negative for headaches, focal weakness or numbness. All other ROS negative ____________________________________________   PHYSICAL EXAM:  VITAL SIGNS: ED Triage Vitals  Enc Vitals Group     BP 04/02/21 1753 108/78     Pulse Rate 04/02/21 1753 100     Resp 04/02/21 1753 20     Temp 04/02/21 1753 99.4 F (37.4 C)     Temp Source 04/02/21 1753 Oral     SpO2 04/02/21 1753 96 %     Weight --      Height --      Head Circumference --      Peak Flow --      Pain Score 04/02/21 1754 6     Pain Loc --      Pain Edu? --      Excl. in [place]? --      Constitutional: Alert and oriented. Well appearing and in no acute distress. Eyes: Conjunctivae are normal. EOMI. Head: Atraumatic. Nose: No congestion/rhinnorhea. Mouth/Throat: Mucous membranes are moist.   Neck: No stridor. Trachea Midline. FROM Cardiovascular: Normal rate, regular rhythm. Grossly normal heart sounds.  Good peripheral circulation. Respiratory: Normal respiratory effort.  No retractions. Lungs CTAB. Gastrointestinal: Soft and nontender. No distention. No abdominal bruits.  Musculoskeletal: No lower extremity tenderness nor edema.  No joint effusions. Neurologic:  Normal speech and language. No gross focal neurologic deficits are appreciated.  Skin:  Skin is warm, dry and intact. No rash noted. Psychiatric: Mood and affect are normal. Speech and behavior are normal. GU: Deferred   ____________________________________________   LABS (all labs ordered are listed, but only abnormal results are displayed)  Labs Reviewed  COMPREHENSIVE METABOLIC PANEL - Abnormal; Notable for the following components:      Result Value   Potassium 3.4 (*)    Glucose, Bld 106 (*)    AST 14 (*)    All other components within normal limits  RESP PANEL BY RT-PCR (FLU A&B, COVID) ARPGX2  CBC WITH DIFFERENTIAL/PLATELET  URINALYSIS, ROUTINE W REFLEX MICROSCOPIC  POC URINE PREG, ED   ____________________________________________     PROCEDURES  Procedure(s) performed (including Critical Care):  Procedures   ____________________________________________   INITIAL IMPRESSION / ASSESSMENT AND PLAN / ED COURSE  [name] was evaluated in Emergency Department on 04/03/2021 for the symptoms described in the history of present illness. She was evaluated in the context of the global COVID-19 pandemic, which necessitated consideration that the patient might be at risk for infection with the SARS-CoV-2 virus that causes COVID-19. Institutional protocols and algorithms that pertain  to the evaluation of patients at risk for COVID-19 are in a state of rapid change based on information released by regulatory bodies including the CDC and federal and state organizations. These policies and algorithms were followed during the patient's care in the ED.    Patient came in initially tachycardic with some low-grade temperatures.  Labs ordered evaluate for electrolyte abnormalities, AKI.  She reports some lower abdominal pain when she has the episode of diarrhea  or nausea but at this time her abdomen is soft and nontender on.  I do lengthy discussion with patient that if she develops right lower quadrant pain stating that we would need to do a CT scan to evaluate for appendicitis but at this time patient denies any abdominal pain.  Symptoms do not sound like ovarian torsion given the nausea, vomiting, diarrhea.  Symptoms could be related to metformin recently started but also could just be a GI bug.  Will get pregnancy test to make sure no evidence of pregnancy.  Labs are all reassuring except for slightly low potassium which and giving some oral repletion as well as some Zofran to help with nausea.  COVID test was negative.  Will do p.o. challenge  2:05 AM patient is tolerate entire glass of water.  Patient is feeling much better.  Patient's repeat abdominal exam is soft and nontender.  Patient feels comfortable with discharge home and will return if she develops pain in the lower abdomen that is more consistent.  I discussed the provisional nature of ED diagnosis, the treatment so far, the ongoing plan of care, follow up appointments and return precautions with the patient and any family or support people present. They expressed understanding and agreed with the plan, discharged home.          ____________________________________________   FINAL CLINICAL IMPRESSION(S) / ED DIAGNOSES   Final diagnoses:  Gastroenteritis  Hypokalemia      MEDICATIONS GIVEN DURING THIS  VISIT:  Medications  potassium chloride SA (KLOR-CON) CR tablet 40 mEq (has no administration in time range)  ondansetron (ZOFRAN-ODT) disintegrating tablet 4 mg (has no administration in time range)     ED Discharge Orders     None        Note:  This document was prepared using Dragon voice recognition software and may include unintentional dictation errors.    Vanessa Vega, MD 04/03/21 [id number]

## 2021-04-03 NOTE — Discharge Instructions (Signed)
Take Zofran 30 minutes before eating.  Restart taking your omeprazole to help with acid reflux.  Return to the ER if you develop pain in your lower abdomen that is not going away or any other concerns

## 2021-04-05 ENCOUNTER — Other Ambulatory Visit: Payer: Self-pay

## 2021-04-05 ENCOUNTER — Encounter: Payer: Self-pay | Admitting: Family Medicine

## 2021-04-05 ENCOUNTER — Ambulatory Visit: Payer: Managed Care, Other (non HMO) | Admitting: Family Medicine

## 2021-04-05 ENCOUNTER — Ambulatory Visit: Payer: Self-pay | Admitting: *Deleted

## 2021-04-05 VITALS — BP 146/94 | HR 105 | Temp 97.9°F | Resp 16 | Wt 341.1 lb

## 2021-04-05 DIAGNOSIS — Z6841 Body Mass Index (BMI) 40.0 and over, adult: Secondary | ICD-10-CM

## 2021-04-05 DIAGNOSIS — Z09 Encounter for follow-up examination after completed treatment for conditions other than malignant neoplasm: Secondary | ICD-10-CM | POA: Insufficient documentation

## 2021-04-05 DIAGNOSIS — R141 Gas pain: Secondary | ICD-10-CM | POA: Insufficient documentation

## 2021-04-05 DIAGNOSIS — R110 Nausea: Secondary | ICD-10-CM | POA: Insufficient documentation

## 2021-04-05 DIAGNOSIS — R143 Flatulence: Secondary | ICD-10-CM | POA: Diagnosis not present

## 2021-04-05 DIAGNOSIS — R142 Eructation: Secondary | ICD-10-CM

## 2021-04-05 DIAGNOSIS — E860 Dehydration: Secondary | ICD-10-CM | POA: Insufficient documentation

## 2021-04-05 MED ORDER — ZENPEP 40000-126000 UNITS PO CPEP: 1.0000 | ORAL_CAPSULE | Freq: Three times a day (TID) | ORAL | 0 refills | Status: AC | PRN

## 2021-04-05 MED ORDER — SIMETHICONE 80 MG PO CHEW: 80.0000 mg | CHEWABLE_TABLET | Freq: Four times a day (QID) | ORAL | 0 refills | Status: AC | PRN

## 2021-04-05 MED ORDER — ONDANSETRON 4 MG PO TBDP: 4.0000 mg | ORAL_TABLET | Freq: Three times a day (TID) | ORAL | 0 refills | Status: DC | PRN

## 2021-04-05 NOTE — Assessment & Plan Note (Signed)
Pulled for refill 

## 2021-04-05 NOTE — Assessment & Plan Note (Signed)
Wt loss noted Continue to monitor

## 2021-04-05 NOTE — Progress Notes (Signed)
Established patient visit   Patient: [name]   DOB: [birth year]   35 y.o. Female  MRN: [id number] Visit Date: 04/05/2021  Today's healthcare provider: [name], FNP   Chief Complaint  Patient presents with   ER follow up   Subjective    HPI  Follow up ER visit  Patient was seen in ER for nausea and vomiting on 04/03/21. She was treated for Gastroenteritis and Hypokalemia. Treatment for this included potassium choloride and given Zofran. She reports satisfactory compliance with treatment. She reports this condition is Unchanged. Patient reports that she is still having symptoms diarrhea and nausea, she states in the past 24hrs she has had 6 bowl movements. Patient reports discoloration of stool and states that stool is watery/oily.   Samples of ZenPep provided; recommend BRAT diet and 1 capsule per meal to assist with malabsorption.  -----------------------------------------------------------------------------------------     Medications: Outpatient Medications Prior to Visit  Medication Sig   albuterol (VENTOLIN HFA) 108 (90 Base) MCG/ACT inhaler Inhale 2 puffs into the lungs every 6 (six) hours as needed for wheezing or shortness of breath.   buPROPion (WELLBUTRIN XL) 150 MG 24 hr tablet Take 1 tablet (150 mg total) by mouth daily.   cyclobenzaprine (FLEXERIL) 10 MG tablet Take 1 tablet (10 mg total) by mouth 3 (three) times daily as needed for muscle spasms.   FLUoxetine (PROZAC) 20 MG tablet Take 1 tablet (20 mg total) by mouth daily.   fluticasone-salmeterol (ADVAIR DISKUS) 250-50 MCG/ACT AEPB Inhale 1 puff into the lungs 2 (two) times daily.   Insulin Pen Needle (BD PEN NEEDLE NANO 2ND GEN) 32G X 4 MM MISC To use daily with saxenda injection   medroxyPROGESTERone (DEPO-PROVERA) 150 MG/ML injection Inject 150 mg into the muscle every 3 (three) months.   meloxicam (MOBIC) 15 MG tablet Take 1 tablet (15 mg total) by mouth daily.   metFORMIN (GLUCOPHAGE XR)  500 MG 24 hr tablet Take 2 tablets (1,000 mg total) by mouth 2 (two) times daily with breakfast and lunch.   metroNIDAZOLE (METROGEL) 0.75 % vaginal gel Place 1 Applicatorful vaginally 2 (two) times daily as needed.   montelukast (SINGULAIR) 10 MG tablet Take 1 tablet (10 mg total) by mouth at bedtime as needed.   nystatin cream (MYCOSTATIN) Apply 1 application topically 2 (two) times daily as needed for dry skin.   omeprazole (PRILOSEC) 40 MG capsule Take 1 capsule (40 mg total) daily by mouth.   potassium chloride (KLOR-CON) 10 MEQ tablet Take 1 tablet (10 mEq total) by mouth daily.   promethazine-dextromethorphan (PROMETHAZINE-DM) 6.25-15 MG/5ML syrup Take 5 mLs by mouth 4 (four) times daily as needed for cough.   propranolol (INDERAL) 20 MG tablet Take 1 tablet (20 mg total) by mouth 3 (three) times daily as needed. For palpitations   Semaglutide,0.25 or 0.5MG /DOS, (OZEMPIC, 0.25 OR 0.5 MG/DOSE,) 2 MG/1.5ML SOPN Inject 0.5 mg into the skin once a week.   Semaglutide-Weight Management (WEGOVY) 0.25 MG/0.5ML SOAJ Inject 0.25 mg into the skin once a week.   tiZANidine (ZANAFLEX) 4 MG tablet 1/2-1 q HS prn   torsemide (DEMADEX) 20 MG tablet Take 1 tablet (20 mg total) by mouth daily. May add second pill if needed   Vitamin D, Ergocalciferol, (DRISDOL) 1.25 MG (50000 UNIT) CAPS capsule Take 1 capsule (50,000 Units total) by mouth every 7 (seven) days.   [DISCONTINUED] ondansetron (ZOFRAN ODT) 4 MG disintegrating tablet Take 1 tablet (4 mg total) by mouth  every 8 (eight) hours as needed for nausea or vomiting.   [DISCONTINUED] ondansetron (ZOFRAN) 4 MG tablet Take 1 tablet (4 mg total) by mouth every 8 (eight) hours as needed.   Facility-Administered Medications Prior to Visit  Medication Dose Route Frequency Provider   bupivacaine (MARCAINE) 0.5 % (with pres) injection 3 mL  3 mL Other Once Tyrell Antonio, MD   medroxyPROGESTERone (DEPO-PROVERA) injection 150 mg  150 mg Intramuscular Once  Burnette, Jennifer M, [name]    Review of Systems     Objective    BP (!) 146/94   Pulse (!) 105   Temp 97.9 F (36.6 C) (Oral)   Resp 16   Wt (!) 341 lb 1.6 oz (154.7 kg)   SpO2 99%   BMI 66.62 kg/m    Physical Exam Vitals and nursing note reviewed.  Constitutional:      General: She is not in acute distress.    Appearance: Normal appearance. She is obese. She is not ill-appearing, toxic-appearing or diaphoretic.  HENT:     Head: Normocephalic and atraumatic.  Cardiovascular:     Rate and Rhythm: Regular rhythm. Tachycardia present.     Pulses: Normal pulses.     Heart sounds: Normal heart sounds. No murmur heard.   No friction rub. No gallop.  Pulmonary:     Effort: Pulmonary effort is normal. No respiratory distress.     Breath sounds: Normal breath sounds. No stridor. No wheezing, rhonchi or rales.  Chest:     Chest wall: No tenderness.  Abdominal:     General: Bowel sounds are normal. There is no distension.     Palpations: Abdomen is soft. There is no mass.     Tenderness: There is no abdominal tenderness. There is no guarding or rebound.  Musculoskeletal:        General: No swelling, tenderness, deformity or signs of injury. Normal range of motion.     Right lower leg: No edema.     Left lower leg: No edema.  Skin:    General: Skin is warm and dry.     Capillary Refill: Capillary refill takes less than 2 seconds.     Coloration: Skin is not jaundiced or pale.     Findings: No bruising, erythema, lesion or rash.  Neurological:     General: No focal deficit present.     Mental Status: She is alert and oriented to person, place, and time. Mental status is at baseline.     Cranial Nerves: No cranial nerve deficit.     Sensory: No sensory deficit.     Motor: No weakness.     Coordination: Coordination normal.  Psychiatric:        Mood and Affect: Mood normal.        Behavior: Behavior normal.        Thought Content: Thought content normal.        Judgment:  Judgment normal.      No results found for any visits on 04/05/21.  Assessment & Plan     Problem List Items Addressed This Visit       Other   Class 3 severe obesity due to excess calories with serious comorbidity and body mass index (BMI) of 60.0 to 69.9 in adult (HCC)    Wt loss noted Continue to monitor      Dehydration    Tachycardic on exam      Flatulence, eructation, and gas pain    Advised BRAT diet Continue  hydration Recommend PRN meds as needed      Relevant Medications   simethicone (MYLICON) 80 MG chewable tablet   Nausea - Primary    Pulled for refill       Relevant Medications   ondansetron (ZOFRAN ODT) 4 MG disintegrating tablet   Encounter for examination following treatment at hospital    Ensure hydration Hold off on lab work Ensure that K will stabilize on its own following supplement Pt will drink powerade based on recs         Return in about 2 months (around 06/05/2021).      Leilani Merl, FNP, have reviewed all documentation for this visit. The documentation on 04/05/21 for the exam, diagnosis, procedures, and orders are all accurate and complete.    [name], FNP  [facility] [phone] (phone) [phone] (fax)  [facility]

## 2021-04-05 NOTE — Assessment & Plan Note (Signed)
Ensure hydration Hold off on lab work Ensure that K will stabilize on its own following supplement Pt will drink powerade based on recs

## 2021-04-05 NOTE — Assessment & Plan Note (Signed)
Advised BRAT diet Continue hydration Recommend PRN meds as needed

## 2021-04-05 NOTE — Assessment & Plan Note (Signed)
Tachycardic on exam

## 2021-04-05 NOTE — Telephone Encounter (Signed)
Patient is calling for appointment- she was seen at ED on Friday and diagnosed with gastritis. Patient was given Zofrn with has stopped the vomiting- but she is still having trouble eating and stopping the diarrhea. Patient also some electrolyte imbalance. Call to office- appointment scheduled.

## 2021-04-05 NOTE — Telephone Encounter (Signed)
Reason for Disposition  [1] MODERATE diarrhea (e.g., 4-6 times / day more than normal) AND [2] present > 48 hours (2 days)  Answer Assessment - Initial Assessment Questions 1. DIARRHEA SEVERITY: "How bad is the diarrhea?" "How many more stools have you had in the past 24 hours than normal?"    - NO DIARRHEA (SCALE 0)   - MILD (SCALE 1-3): Few loose or mushy BMs; increase of 1-3 stools over normal daily number of stools; mild increase in ostomy output.   -  MODERATE (SCALE 4-7): Increase of 4-6 stools daily over normal; moderate increase in ostomy output. * SEVERE (SCALE 8-10; OR 'WORST POSSIBLE'): Increase of 7 or more stools daily over normal; moderate increase in ostomy output; incontinence.     moderate 2. ONSET: "When did the diarrhea begin?"      Wednesday 3. BM CONSISTENCY: "How loose or watery is the diarrhea?"      Loose/watery 4. VOMITING: "Are you also vomiting?" If Yes, ask: "How many times in the past 24 hours?"      Patient is using Zofran- last time vomited- Friday morning 5. ABDOMINAL PAIN: "Are you having any abdominal pain?" If Yes, ask: "What does it feel like?" (e.g., crampy, dull, intermittent, constant)      Crampy pain- patient has BM- feels some better 6. ABDOMINAL PAIN SEVERITY: If present, ask: "How bad is the pain?"  (e.g., Scale 1-10; mild, moderate, or severe)   - MILD (1-3): doesn't interfere with normal activities, abdomen soft and not tender to touch    - MODERATE (4-7): interferes with normal activities or awakens from sleep, abdomen tender to touch    - SEVERE (8-10): excruciating pain, doubled over, unable to do any normal activities       Moderate to severe when occurring  7. ORAL INTAKE: If vomiting, "Have you been able to drink liquids?" "How much liquids have you had in the past 24 hours?"     Liquids- water, juice 8. HYDRATION: "Any signs of dehydration?" (e.g., dry mouth [not just dry lips], too weak to stand, dizziness, new weight loss) "When did you  last urinate?"     Weight loss 9. EXPOSURE: "Have you traveled to a foreign country recently?" "Have you been exposed to anyone with diarrhea?" "Could you have eaten any food that was spoiled?"     Unknown cause of illness 10. ANTIBIOTIC USE: "Are you taking antibiotics now or have you taken antibiotics in the past 2 months?"       Patient did have antibiotic for ear infection- July/August 11. OTHER SYMPTOMS: "Do you have any other symptoms?" (e.g., fever, blood in stool)       no 12. PREGNANCY: "Is there any chance you are pregnant?" "When was your last menstrual period?"       No- UPT negative at ED  Protocols used: [facility]

## 2021-04-13 ENCOUNTER — Ambulatory Visit: Payer: Managed Care, Other (non HMO) | Admitting: Family Medicine

## 2021-04-29 ENCOUNTER — Telehealth: Payer: Managed Care, Other (non HMO) | Admitting: Physician Assistant

## 2021-04-29 ENCOUNTER — Other Ambulatory Visit: Payer: Self-pay | Admitting: Physical Medicine and Rehabilitation

## 2021-04-29 DIAGNOSIS — J0140 Acute pansinusitis, unspecified: Secondary | ICD-10-CM

## 2021-04-29 DIAGNOSIS — G8929 Other chronic pain: Secondary | ICD-10-CM

## 2021-04-29 DIAGNOSIS — T3695XA Adverse effect of unspecified systemic antibiotic, initial encounter: Secondary | ICD-10-CM

## 2021-04-29 DIAGNOSIS — B379 Candidiasis, unspecified: Secondary | ICD-10-CM | POA: Diagnosis not present

## 2021-04-29 DIAGNOSIS — B9689 Other specified bacterial agents as the cause of diseases classified elsewhere: Secondary | ICD-10-CM | POA: Diagnosis not present

## 2021-04-29 DIAGNOSIS — M47816 Spondylosis without myelopathy or radiculopathy, lumbar region: Secondary | ICD-10-CM

## 2021-04-29 DIAGNOSIS — J208 Acute bronchitis due to other specified organisms: Secondary | ICD-10-CM | POA: Diagnosis not present

## 2021-04-29 MED ORDER — FLUCONAZOLE 150 MG PO TABS: 150.0000 mg | ORAL_TABLET | Freq: Once | ORAL | 0 refills | Status: AC

## 2021-04-29 MED ORDER — PREDNISONE 10 MG PO TABS: ORAL_TABLET | ORAL | 0 refills | Status: DC

## 2021-04-29 MED ORDER — AZITHROMYCIN 250 MG PO TABS: ORAL_TABLET | ORAL | 0 refills | Status: AC

## 2021-04-29 MED ORDER — BENZONATATE 100 MG PO CAPS: 100.0000 mg | ORAL_CAPSULE | Freq: Three times a day (TID) | ORAL | 0 refills | Status: DC | PRN

## 2021-04-29 MED ORDER — PROMETHAZINE-DM 6.25-15 MG/5ML PO SYRP: 5.0000 mL | ORAL_SOLUTION | Freq: Four times a day (QID) | ORAL | 0 refills | Status: DC | PRN

## 2021-04-29 NOTE — Patient Instructions (Signed)
[name], thank you for joining Margaretann Loveless, [name] for today's virtual visit.  While this provider is not your primary care provider (PCP), if your PCP is located in our provider database this encounter information will be shared with them immediately following your visit.  Consent: (Patient) [name] provided verbal consent for this virtual visit at the beginning of the encounter.  Current Medications:  Current Outpatient Medications:    azithromycin (ZITHROMAX) 250 MG tablet, Take 2 tablets on day 1, then 1 tablet daily on days 2 through 5, Disp: 6 tablet, Rfl: 0   benzonatate (TESSALON) 100 MG capsule, Take 1 capsule (100 mg total) by mouth 3 (three) times daily as needed., Disp: 30 capsule, Rfl: 0   fluconazole (DIFLUCAN) 150 MG tablet, Take 1 tablet (150 mg total) by mouth once for 1 dose. May repeat in 72 hours if needed, Disp: 2 tablet, Rfl: 0   predniSONE (DELTASONE) 10 MG tablet, Take 6 tablets PO on day 1 and day 2, take 5 tablets PO on day 3 and day 4, take 4 tablets PO on day 5 and day 6, take 3 tablets PO on day 7 and day 8, take 2 tablets PO on day 9 and day 10, take one tablet PO on day 11 and day 12., Disp: 42 tablet, Rfl: 0   promethazine-dextromethorphan (PROMETHAZINE-DM) 6.25-15 MG/5ML syrup, Take 5 mLs by mouth 4 (four) times daily as needed for cough., Disp: 118 mL, Rfl: 0   albuterol (VENTOLIN HFA) 108 (90 Base) MCG/ACT inhaler, Inhale 2 puffs into the lungs every 6 (six) hours as needed for wheezing or shortness of breath., Disp: 8 g, Rfl: 0   buPROPion (WELLBUTRIN XL) 150 MG 24 hr tablet, Take 1 tablet (150 mg total) by mouth daily., Disp: 90 tablet, Rfl: 0   cyclobenzaprine (FLEXERIL) 10 MG tablet, Take 1 tablet (10 mg total) by mouth 3 (three) times daily as needed for muscle spasms., Disp: 90 tablet, Rfl: 1   FLUoxetine (PROZAC) 20 MG tablet, Take 1 tablet (20 mg total) by mouth daily., Disp: 30 tablet, Rfl: 1   fluticasone-salmeterol (ADVAIR  DISKUS) 250-50 MCG/ACT AEPB, Inhale 1 puff into the lungs 2 (two) times daily., Disp: 60 each, Rfl: 0   Insulin Pen Needle (BD PEN NEEDLE NANO 2ND GEN) 32G X 4 MM MISC, To use daily with saxenda injection, Disp: 100 each, Rfl: 3   medroxyPROGESTERone (DEPO-PROVERA) 150 MG/ML injection, Inject 150 mg into the muscle every 3 (three) months., Disp: , Rfl:    meloxicam (MOBIC) 15 MG tablet, Take 1 tablet (15 mg total) by mouth daily., Disp: 90 tablet, Rfl: 1   metFORMIN (GLUCOPHAGE XR) 500 MG 24 hr tablet, Take 2 tablets (1,000 mg total) by mouth 2 (two) times daily with breakfast and lunch., Disp: 360 tablet, Rfl: 0   metroNIDAZOLE (METROGEL) 0.75 % vaginal gel, Place 1 Applicatorful vaginally 2 (two) times daily as needed., Disp: , Rfl:    montelukast (SINGULAIR) 10 MG tablet, Take 1 tablet (10 mg total) by mouth at bedtime as needed., Disp: 30 tablet, Rfl: 0   nystatin cream (MYCOSTATIN), Apply 1 application topically 2 (two) times daily as needed for dry skin., Disp: , Rfl:    omeprazole (PRILOSEC) 40 MG capsule, Take 1 capsule (40 mg total) daily by mouth., Disp: 30 capsule, Rfl: 3   ondansetron (ZOFRAN ODT) 4 MG disintegrating tablet, Take 1 tablet (4 mg total) by mouth every 8 (eight) hours as needed for nausea  or vomiting., Disp: 20 tablet, Rfl: 0   Pancrelipase, Lip-Prot-Amyl, (ZENPEP) 40000-126000 units CPEP, Take 1 capsule by mouth 3 (three) times daily as needed., Disp: 84 capsule, Rfl: 0   potassium chloride (KLOR-CON) 10 MEQ tablet, Take 1 tablet (10 mEq total) by mouth daily., Disp: 90 tablet, Rfl: 3   propranolol (INDERAL) 20 MG tablet, Take 1 tablet (20 mg total) by mouth 3 (three) times daily as needed. For palpitations, Disp: 90 tablet, Rfl: 5   Semaglutide,0.25 or 0.5MG /DOS, (OZEMPIC, 0.25 OR 0.5 MG/DOSE,) 2 MG/1.5ML SOPN, Inject 0.5 mg into the skin once a week., Disp: 1.5 mL, Rfl: 10   simethicone (MYLICON) 80 MG chewable tablet, Chew 1 tablet (80 mg total) by mouth every 6 (six)  hours as needed for flatulence., Disp: 30 tablet, Rfl: 0   tiZANidine (ZANAFLEX) 4 MG tablet, 1/2-1 q HS prn, Disp: , Rfl:    torsemide (DEMADEX) 20 MG tablet, Take 1 tablet (20 mg total) by mouth daily. May add second pill if needed, Disp: 60 tablet, Rfl: 5   Vitamin D, Ergocalciferol, (DRISDOL) 1.25 MG (50000 UNIT) CAPS capsule, Take 1 capsule (50,000 Units total) by mouth every 7 (seven) days., Disp: 12 capsule, Rfl: 1  Current Facility-Administered Medications:    bupivacaine (MARCAINE) 0.5 % (with pres) injection 3 mL, 3 mL, Other, Once, Tyrell Antonio, MD   medroxyPROGESTERone (DEPO-PROVERA) injection 150 mg, 150 mg, Intramuscular, Once, Brytni Dray M, [name]   Medications ordered in this encounter:  Meds ordered this encounter  Medications   azithromycin (ZITHROMAX) 250 MG tablet    Sig: Take 2 tablets on day 1, then 1 tablet daily on days 2 through 5    Dispense:  6 tablet    Refill:  0    Order Specific Question:   Supervising Provider    Answer:   Hyacinth Meeker, BRIAN [[id number]]   predniSONE (DELTASONE) 10 MG tablet    Sig: Take 6 tablets PO on day 1 and day 2, take 5 tablets PO on day 3 and day 4, take 4 tablets PO on day 5 and day 6, take 3 tablets PO on day 7 and day 8, take 2 tablets PO on day 9 and day 10, take one tablet PO on day 11 and day 12.    Dispense:  42 tablet    Refill:  0    Order Specific Question:   Supervising Provider    Answer:   Hyacinth Meeker, BRIAN [[id number]]   promethazine-dextromethorphan (PROMETHAZINE-DM) 6.25-15 MG/5ML syrup    Sig: Take 5 mLs by mouth 4 (four) times daily as needed for cough.    Dispense:  118 mL    Refill:  0    Order Specific Question:   Supervising Provider    Answer:   MILLER, BRIAN [[id number]]   benzonatate (TESSALON) 100 MG capsule    Sig: Take 1 capsule (100 mg total) by mouth 3 (three) times daily as needed.    Dispense:  30 capsule    Refill:  0    Order Specific Question:   Supervising Provider    Answer:   MILLER, BRIAN [[id number]]    fluconazole (DIFLUCAN) 150 MG tablet    Sig: Take 1 tablet (150 mg total) by mouth once for 1 dose. May repeat in 72 hours if needed    Dispense:  2 tablet    Refill:  0    Order Specific Question:   Supervising Provider    Answer:   Eber Hong [[id number]]     *  If you need refills on other medications prior to your next appointment, please contact your pharmacy*  Follow-Up: Call back or seek an in-person evaluation if the symptoms worsen or if the condition fails to improve as anticipated.  Other Instructions Acute Bronchitis, Adult Acute bronchitis is sudden inflammation of the main airways (bronchi) that come off the windpipe (trachea) in the lungs. The swelling causes the airways to get smaller and make more mucus than normal. This can make it hard to breathe and can cause coughing or noisy breathing (wheezing). Acute bronchitis may last several weeks. The cough may last longer. Allergies, asthma, and exposure to smoke may make the condition worse. What are the causes? This condition can be caused by germs and by substances that irritate the lungs, including: Cold and flu viruses. The most common cause of this condition is the virus that causes the common cold. Bacteria. This is less common. Breathing in substances that irritate the lungs, including: Smoke from cigarettes and other forms of tobacco. Dust and pollen. Fumes from household cleaning products, gases, or burned fuel. Indoor or outdoor air pollution. What increases the risk? The following factors may make you more likely to develop this condition: A weak body's defense system, also called the immune system. A condition that affects your lungs and breathing, such as asthma. What are the signs or symptoms? Common symptoms of this condition include: Coughing. This may bring up clear, yellow, or green mucus from your lungs (sputum). Wheezing. Runny or stuffy nose. Having too much mucus in your lungs (chest  congestion). Shortness of breath. Aches and pains, including sore throat or chest. How is this diagnosed? This condition is usually diagnosed based on: Your symptoms and medical history. A physical exam. You may also have other tests, including tests to rule out other conditions, such as pneumonia. These tests include: A test of lung function. Test of a mucus sample to look for the presence of bacteria. Tests to check the oxygen level in your blood. Blood tests. Chest X-ray. How is this treated? Most cases of acute bronchitis clear up over time without treatment. Your health care provider may recommend: Drinking more fluids to help thin your mucus so it is easier to cough up. Taking inhaled medicine (inhaler) to improve air flow in and out of your lungs. Using a vaporizer or a humidifier. These are machines that add water to the air to help you breathe better. Taking a medicine that thins mucus and clears congestion (expectorant). Taking a medicine that prevents or stops coughing (cough suppressant). It is notcommon to take an antibiotic medicine for this condition. Follow these instructions at home:  Take over-the-counter and prescription medicines only as told by your health care provider. Use an inhaler, vaporizer, or humidifier as told by your health care provider. Take two teaspoons (10 mL) of honey at bedtime to lessen coughing at night. Drink enough fluid to keep your urine pale yellow. Do not use any products that contain nicotine or tobacco. These products include cigarettes, chewing tobacco, and vaping devices, such as e-cigarettes. If you need help quitting, ask your health care provider. Get plenty of rest. Return to your normal activities as told by your health care provider. Ask your health care provider what activities are safe for you. Keep all follow-up visits. This is important. How is this prevented? To lower your risk of getting this condition again: Wash your  hands often with soap and water for at least 20 seconds. If soap and water  are not available, use hand sanitizer. Avoid contact with people who have cold symptoms. Try not to touch your mouth, nose, or eyes with your hands. Avoid breathing in smoke or chemical fumes. Breathing smoke or chemical fumes will make your condition worse. Get the flu shot every year. Contact a health care provider if: Your symptoms do not improve after 2 weeks. You have trouble coughing up the mucus. Your cough keeps you awake at night. You have a fever. Get help right away if you: Cough up blood. Feel pain in your chest. Have severe shortness of breath. Faint or keep feeling like you are going to faint. Have a severe headache. Have a fever or chills that get worse. These symptoms may represent a serious problem that is an emergency. Do not wait to see if the symptoms will go away. Get medical help right away. Call your local emergency services (911 in the [place] Do not drive yourself to the hospital. Summary Acute bronchitis is inflammation of the main airways (bronchi) that come off the windpipe (trachea) in the lungs. The swelling causes the airways to get smaller and make more mucus than normal. Drinking more fluids can help thin your mucus so it is easier to cough up. Take over-the-counter and prescription medicines only as told by your health care provider. Do not use any products that contain nicotine or tobacco. These products include cigarettes, chewing tobacco, and vaping devices, such as e-cigarettes. If you need help quitting, ask your health care provider. Contact a health care provider if your symptoms do not improve after 2 weeks. This information is not intended to replace advice given to you by your health care provider. Make sure you discuss any questions you have with your health care provider. Document Revised: 09/09/2020 Document Reviewed: 09/09/2020 Elsevier Patient Education  2022 [employer]   Sinusitis, Adult Sinusitis is inflammation of your sinuses. Sinuses are hollow spaces in the bones around your face. Your sinuses are located: Around your eyes. In the middle of your forehead. Behind your nose. In your cheekbones. Mucus normally drains out of your sinuses. When your nasal tissues become inflamed or swollen, mucus can become trapped or blocked. This allows bacteria, viruses, and fungi to grow, which leads to infection. Most infections of the sinuses are caused by a virus. Sinusitis can develop quickly. It can last for up to 4 weeks (acute) or for more than 12 weeks (chronic). Sinusitis often develops after a cold. What are the causes? This condition is caused by anything that creates swelling in the sinuses or stops mucus from draining. This includes: Allergies. Asthma. Infection from bacteria or viruses. Deformities or blockages in your nose or sinuses. Abnormal growths in the nose (nasal polyps). Pollutants, such as chemicals or irritants in the air. Infection from fungi (rare). What increases the risk? You are more likely to develop this condition if you: Have a weak body defense system (immune system). Do a lot of swimming or diving. Overuse nasal sprays. Smoke. What are the signs or symptoms? The main symptoms of this condition are pain and a feeling of pressure around the affected sinuses. Other symptoms include: Stuffy nose or congestion. Thick drainage from your nose. Swelling and warmth over the affected sinuses. Headache. Upper toothache. A cough that may get worse at night. Extra mucus that collects in the throat or the back of the nose (postnasal drip). Decreased sense of smell and taste. Fatigue. A fever. Sore throat. Bad breath. How is this diagnosed? This  condition is diagnosed based on: Your symptoms. Your medical history. A physical exam. Tests to find out if your condition is acute or chronic. This may include: Checking your nose  for nasal polyps. Viewing your sinuses using a device that has a light (endoscope). Testing for allergies or bacteria. Imaging tests, such as an MRI or CT scan. In rare cases, a bone biopsy may be done to rule out more serious types of fungal sinus disease. How is this treated? Treatment for sinusitis depends on the cause and whether your condition is chronic or acute. If caused by a virus, your symptoms should go away on their own within 10 days. You may be given medicines to relieve symptoms. They include: Medicines that shrink swollen nasal passages (topical intranasal decongestants). Medicines that treat allergies (antihistamines). A spray that eases inflammation of the nostrils (topical intranasal corticosteroids). Rinses that help get rid of thick mucus in your nose (nasal saline washes). If caused by bacteria, your health care provider may recommend waiting to see if your symptoms improve. Most bacterial infections will get better without antibiotic medicine. You may be given antibiotics if you have: A severe infection. A weak immune system. If caused by narrow nasal passages or nasal polyps, you may need to have surgery. Follow these instructions at home: Medicines Take, use, or apply over-the-counter and prescription medicines only as told by your health care provider. These may include nasal sprays. If you were prescribed an antibiotic medicine, take it as told by your health care provider. Do not stop taking the antibiotic even if you start to feel better. Hydrate and humidify  Drink enough fluid to keep your urine pale yellow. Staying hydrated will help to thin your mucus. Use a cool mist humidifier to keep the humidity level in your home above 50%. Inhale steam for 10-15 minutes, 3-4 times a day, or as told by your health care provider. You can do this in the bathroom while a hot shower is running. Limit your exposure to cool or dry air. Rest Rest as much as possible. Sleep  with your head raised (elevated). Make sure you get enough sleep each night. General instructions  Apply a warm, moist washcloth to your face 3-4 times a day or as told by your health care provider. This will help with discomfort. Wash your hands often with soap and water to reduce your exposure to germs. If soap and water are not available, use hand sanitizer. Do not smoke. Avoid being around people who are smoking (secondhand smoke). Keep all follow-up visits as told by your health care provider. This is important. Contact a health care provider if: You have a fever. Your symptoms get worse. Your symptoms do not improve within 10 days. Get help right away if: You have a severe headache. You have persistent vomiting. You have severe pain or swelling around your face or eyes. You have vision problems. You develop confusion. Your neck is stiff. You have trouble breathing. Summary Sinusitis is soreness and inflammation of your sinuses. Sinuses are hollow spaces in the bones around your face. This condition is caused by nasal tissues that become inflamed or swollen. The swelling traps or blocks the flow of mucus. This allows bacteria, viruses, and fungi to grow, which leads to infection. If you were prescribed an antibiotic medicine, take it as told by your health care provider. Do not stop taking the antibiotic even if you start to feel better. Keep all follow-up visits as told by your health care  provider. This is important. This information is not intended to replace advice given to you by your health care provider. Make sure you discuss any questions you have with your health care provider. Document Revised: 10/09/2017 Document Reviewed: 10/09/2017 Elsevier Patient Education  2022 [employer].    If you have been instructed to have an in-person evaluation today at a local Urgent Care facility, please use the link below. It will take you to a list of all of our available [facility], including address, phone number and hours of operation. Please do not delay care.  [facility]  If you or a family member do not have a primary care provider, use the link below to schedule a visit and establish care. When you choose a [facility] primary care physician or advanced practice provider, you gain a long-term partner in health. Find a Primary Care Provider  Learn more about [facility] in-office and virtual care options: [facility] Get Care Now

## 2021-04-29 NOTE — Progress Notes (Signed)
Virtual Visit Consent   [name], you are scheduled for a virtual visit with a [facility] provider today.     Just as with appointments in the office, your consent must be obtained to participate.  Your consent will be active for this visit and any virtual visit you may have with one of our providers in the next 365 days.     If you have a MyChart account, a copy of this consent can be sent to you electronically.  All virtual visits are billed to your insurance company just like a traditional visit in the office.    As this is a virtual visit, video technology does not allow for your provider to perform a traditional examination.  This may limit your provider's ability to fully assess your condition.  If your provider identifies any concerns that need to be evaluated in person or the need to arrange testing (such as labs, EKG, etc.), we will make arrangements to do so.     Although advances in technology are sophisticated, we cannot ensure that it will always work on either your end or our end.  If the connection with a video visit is poor, the visit may have to be switched to a telephone visit.  With either a video or telephone visit, we are not always able to ensure that we have a secure connection.     I need to obtain your verbal consent now.   Are you willing to proceed with your visit today?    [name] has provided verbal consent on 04/29/2021 for a virtual visit (video or telephone).   Mar Daring, [name]   Date: 04/29/2021 6:50 PM   Virtual Visit via Video Note   I, Mar Daring, connected with  [name]  ([id number], [birth year]) on 04/29/21 at  6:45 PM EST by a video-enabled telemedicine application and verified that I am speaking with the correct person using two identifiers.  Location: Patient: Virtual Visit Location Patient: Home Provider: Virtual Visit Location Provider: Home Office   I discussed the limitations of evaluation and  management by telemedicine and the availability of in person appointments. The patient expressed understanding and agreed to proceed.    History of Present Illness: [name] is a 35 y.o. who identifies as a female who was assigned female at birth, and is being seen today for cough and congestion.  HPI: Cough This is a new problem. The current episode started 1 to 4 weeks ago (over 10 days). The problem has been gradually worsening. The problem occurs every few minutes. The cough is Productive of sputum. Associated symptoms include chest pain (tightness), nasal congestion, postnasal drip, rhinorrhea (night is worse), shortness of breath and wheezing. Pertinent negatives include no chills, ear congestion, ear pain or fever. Associated symptoms comments: Hoarse voice. The symptoms are aggravated by lying down. She has tried a beta-agonist inhaler, OTC cough suppressant and rest for the symptoms. Her past medical history is significant for asthma, bronchitis and environmental allergies.     Problems:  Patient Active Problem List   Diagnosis Date Noted   Dehydration 04/05/2021   Flatulence, eructation, and gas pain 04/05/2021   Nausea 04/05/2021   Encounter for examination following treatment at hospital 04/05/2021   Chronic pain of both knees 03/11/2021   Encounter for surveillance of injectable contraceptive 03/11/2021   Chronic diastolic CHF (congestive heart failure) ([place]) 08/08/2018   Depression, major, single episode, mild ([place]) 08/08/2018  Asthma exacerbation 12/28/2017   Benign essential HTN 05/04/2016   History of pre-eclampsia 02/12/2015   Abnormal vaginal Pap smear 02/05/2015   Abortion, spontaneous 02/05/2015   Anxiety 02/05/2015   LBP (low back pain) 02/05/2015   Fatigue 02/05/2015   Fibrositis 0000000   Folliculitis 0000000   Acid reflux 02/05/2015   History of diabetes mellitus arising in pregnancy 02/05/2015   High risk sexual behavior 02/05/2015   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   Class 3 severe obesity due to excess calories with serious comorbidity and body mass index (BMI) of 60.0 to 69.9 in adult ([place]) 02/05/2015   Obstructive sleep apnea 02/05/2015   Vitamin D deficiency 02/05/2015   Depression 02/05/2015   Fibromyalgia 02/05/2015   Abnormal C-reactive protein 10/24/2012   Chronic low back pain 08/13/2012   Absence of menstruation 09/22/2009   Migraine without status migrainosus 07/22/2008    Allergies:  Allergies  Allergen Reactions   Lactose Intolerance (Gi) Diarrhea   Penicillins Hives    Has patient had a PCN reaction causing immediate rash, facial/tongue/throat swelling, SOB or lightheadedness with hypotension: Yes Has patient had a PCN reaction causing severe rash involving mucus membranes or skin necrosis: No Has patient had a PCN reaction that required hospitalization No Has patient had a PCN reaction occurring within the last 10 years: Yes If all of the above answers are "NO", then may proceed with Cephalosporin use.   [place] Other (See Comments)    Patient states this cuts my mouth and makes it bleed.   Medications:  Current Outpatient Medications:    azithromycin (ZITHROMAX) 250 MG tablet, Take 2 tablets on day 1, then 1 tablet daily on days 2 through 5, Disp: 6 tablet, Rfl: 0   benzonatate (TESSALON) 100 MG capsule, Take 1 capsule (100 mg total) by mouth 3 (three) times daily as needed., Disp: 30 capsule, Rfl: 0   fluconazole (DIFLUCAN) 150 MG tablet, Take 1 tablet (150 mg total) by mouth once for 1 dose. May repeat in 72 hours if needed, Disp: 2 tablet, Rfl: 0   predniSONE (DELTASONE) 10 MG tablet, Take 6 tablets PO on day 1 and day 2, take 5 tablets PO on day 3 and day 4, take 4 tablets PO on day 5 and day 6, take 3 tablets PO on day 7 and day 8, take 2 tablets PO on day 9 and day 10, take one tablet PO on day 11 and day 12., Disp: 42 tablet, Rfl: 0   promethazine-dextromethorphan (PROMETHAZINE-DM) 6.25-15 MG/5ML syrup,  Take 5 mLs by mouth 4 (four) times daily as needed for cough., Disp: 118 mL, Rfl: 0   albuterol (VENTOLIN HFA) 108 (90 Base) MCG/ACT inhaler, Inhale 2 puffs into the lungs every 6 (six) hours as needed for wheezing or shortness of breath., Disp: 8 g, Rfl: 0   buPROPion (WELLBUTRIN XL) 150 MG 24 hr tablet, Take 1 tablet (150 mg total) by mouth daily., Disp: 90 tablet, Rfl: 0   cyclobenzaprine (FLEXERIL) 10 MG tablet, Take 1 tablet (10 mg total) by mouth 3 (three) times daily as needed for muscle spasms., Disp: 90 tablet, Rfl: 1   FLUoxetine (PROZAC) 20 MG tablet, Take 1 tablet (20 mg total) by mouth daily., Disp: 30 tablet, Rfl: 1   fluticasone-salmeterol (ADVAIR DISKUS) 250-50 MCG/ACT AEPB, Inhale 1 puff into the lungs 2 (two) times daily., Disp: 60 each, Rfl: 0   Insulin Pen Needle (BD PEN NEEDLE NANO 2ND GEN) 32G X 4 MM MISC, To use daily  with saxenda injection, Disp: 100 each, Rfl: 3   medroxyPROGESTERone (DEPO-PROVERA) 150 MG/ML injection, Inject 150 mg into the muscle every 3 (three) months., Disp: , Rfl:    meloxicam (MOBIC) 15 MG tablet, Take 1 tablet (15 mg total) by mouth daily., Disp: 90 tablet, Rfl: 1   metFORMIN (GLUCOPHAGE XR) 500 MG 24 hr tablet, Take 2 tablets (1,000 mg total) by mouth 2 (two) times daily with breakfast and lunch., Disp: 360 tablet, Rfl: 0   metroNIDAZOLE (METROGEL) 0.75 % vaginal gel, Place 1 Applicatorful vaginally 2 (two) times daily as needed., Disp: , Rfl:    montelukast (SINGULAIR) 10 MG tablet, Take 1 tablet (10 mg total) by mouth at bedtime as needed., Disp: 30 tablet, Rfl: 0   nystatin cream (MYCOSTATIN), Apply 1 application topically 2 (two) times daily as needed for dry skin., Disp: , Rfl:    omeprazole (PRILOSEC) 40 MG capsule, Take 1 capsule (40 mg total) daily by mouth., Disp: 30 capsule, Rfl: 3   ondansetron (ZOFRAN ODT) 4 MG disintegrating tablet, Take 1 tablet (4 mg total) by mouth every 8 (eight) hours as needed for nausea or vomiting., Disp: 20 tablet,  Rfl: 0   Pancrelipase, Lip-Prot-Amyl, (ZENPEP) 40000-126000 units CPEP, Take 1 capsule by mouth 3 (three) times daily as needed., Disp: 84 capsule, Rfl: 0   potassium chloride (KLOR-CON) 10 MEQ tablet, Take 1 tablet (10 mEq total) by mouth daily., Disp: 90 tablet, Rfl: 3   propranolol (INDERAL) 20 MG tablet, Take 1 tablet (20 mg total) by mouth 3 (three) times daily as needed. For palpitations, Disp: 90 tablet, Rfl: 5   Semaglutide,0.25 or 0.5MG /DOS, (OZEMPIC, 0.25 OR 0.5 MG/DOSE,) 2 MG/1.5ML SOPN, Inject 0.5 mg into the skin once a week., Disp: 1.5 mL, Rfl: 10   simethicone (MYLICON) 80 MG chewable tablet, Chew 1 tablet (80 mg total) by mouth every 6 (six) hours as needed for flatulence., Disp: 30 tablet, Rfl: 0   tiZANidine (ZANAFLEX) 4 MG tablet, 1/2-1 q HS prn, Disp: , Rfl:    torsemide (DEMADEX) 20 MG tablet, Take 1 tablet (20 mg total) by mouth daily. May add second pill if needed, Disp: 60 tablet, Rfl: 5   Vitamin D, Ergocalciferol, (DRISDOL) 1.25 MG (50000 UNIT) CAPS capsule, Take 1 capsule (50,000 Units total) by mouth every 7 (seven) days., Disp: 12 capsule, Rfl: 1  Current Facility-Administered Medications:    bupivacaine (MARCAINE) 0.5 % (with pres) injection 3 mL, 3 mL, Other, Once, Magnus Sinning, MD   medroxyPROGESTERone (DEPO-PROVERA) injection 150 mg, 150 mg, Intramuscular, Once, Fenton Malling M, [name]  Observations/Objective: Patient is well-developed, well-nourished in no acute distress.  Resting comfortably  at home.  Head is normocephalic, atraumatic.  No labored breathing.  Speech is clear and coherent with logical content.  Patient is alert and oriented at baseline.    Assessment and Plan: 1. Acute bacterial bronchitis - azithromycin (ZITHROMAX) 250 MG tablet; Take 2 tablets on day 1, then 1 tablet daily on days 2 through 5  Dispense: 6 tablet; Refill: 0 - predniSONE (DELTASONE) 10 MG tablet; Take 6 tablets PO on day 1 and day 2, take 5 tablets PO on day 3 and  day 4, take 4 tablets PO on day 5 and day 6, take 3 tablets PO on day 7 and day 8, take 2 tablets PO on day 9 and day 10, take one tablet PO on day 11 and day 12.  Dispense: 42 tablet; Refill: 0 - promethazine-dextromethorphan (PROMETHAZINE-DM) 6.25-15 MG/5ML syrup;  Take 5 mLs by mouth 4 (four) times daily as needed for cough.  Dispense: 118 mL; Refill: 0 - benzonatate (TESSALON) 100 MG capsule; Take 1 capsule (100 mg total) by mouth 3 (three) times daily as needed.  Dispense: 30 capsule; Refill: 0  2. Acute non-recurrent pansinusitis - azithromycin (ZITHROMAX) 250 MG tablet; Take 2 tablets on day 1, then 1 tablet daily on days 2 through 5  Dispense: 6 tablet; Refill: 0 - predniSONE (DELTASONE) 10 MG tablet; Take 6 tablets PO on day 1 and day 2, take 5 tablets PO on day 3 and day 4, take 4 tablets PO on day 5 and day 6, take 3 tablets PO on day 7 and day 8, take 2 tablets PO on day 9 and day 10, take one tablet PO on day 11 and day 12.  Dispense: 42 tablet; Refill: 0  3. Antibiotic-induced yeast infection - fluconazole (DIFLUCAN) 150 MG tablet; Take 1 tablet (150 mg total) by mouth once for 1 dose. May repeat in 72 hours if needed  Dispense: 2 tablet; Refill: 0  - Worsening symptoms that have not responded to OTC medications.  - Will give azithromycin, Promethazine DM, Tessalon perles and prednisone - Gets yeast infections with antibiotics. Diflucan given - Continue allergy medications.  - Stay well hydrated and get plenty of rest.  - Call or seek in person evaluation if no symptom improvement or if symptoms worsen.   Follow Up Instructions: I discussed the assessment and treatment plan with the patient. The patient was provided an opportunity to ask questions and all were answered. The patient agreed with the plan and demonstrated an understanding of the instructions.  A copy of instructions were sent to the patient via MyChart unless otherwise noted below.    The patient was advised to call  back or seek an in-person evaluation if the symptoms worsen or if the condition fails to improve as anticipated.  Time:  I spent 11 minutes with the patient via telehealth technology discussing the above problems/concerns.    Margaretann Loveless, [name]

## 2021-05-10 ENCOUNTER — Telehealth: Payer: Self-pay | Admitting: Family Medicine

## 2021-05-10 NOTE — Telephone Encounter (Signed)
[facility] advised patient today they sent over a PA on 05/06/2021 for Semaglutide,0.25 or 0.5MG /DOS, (OZEMPIC, 0.25 OR 0.5 MG/DOSE,) 2 MG/1.5ML SOPN, patient checking on the status. Patient would like a follow up call  [facility] [address] ([address]), [place] - [address] Phone:  [phone]  Fax:  [phone]

## 2021-05-14 ENCOUNTER — Encounter: Payer: Self-pay | Admitting: Family Medicine

## 2021-05-14 NOTE — Telephone Encounter (Signed)
Pt stated that her PA was denied, pt wanted to know what her next steps would be for Ozempic.

## 2021-05-18 ENCOUNTER — Other Ambulatory Visit: Payer: Self-pay | Admitting: Family Medicine

## 2021-05-18 DIAGNOSIS — I5032 Chronic diastolic (congestive) heart failure: Secondary | ICD-10-CM

## 2021-05-18 NOTE — Telephone Encounter (Signed)
Patient was advised. KW 

## 2021-05-21 ENCOUNTER — Telehealth: Payer: Self-pay

## 2021-05-21 NOTE — Telephone Encounter (Signed)
Copied from CRM [id number]. Topic: General - Other >> May 21, 2021  8:51 AM Gaetana Michaelis A wrote: Reason for CRM: The patient has spoken with their insurance provider's pharmacy ([facility]) and been told that after their Semaglutide,0.25 or 0.5MG /DOS, (OZEMPIC, 0.25 OR 0.5 MG/DOSE,) 2 MG/1.5ML SOPN [[id number]]  is picked up in person two times it must become a mail order prescription   The patient has been told by their insurer/pharmacy that the medication does not require pre authorization   The patient would like to make sure that their PCP is okay with them receiving this prescription by mail   Please contact further when possible

## 2021-06-04 ENCOUNTER — Other Ambulatory Visit: Payer: Self-pay

## 2021-06-04 ENCOUNTER — Encounter: Payer: Self-pay | Admitting: Family Medicine

## 2021-06-04 ENCOUNTER — Ambulatory Visit: Payer: Managed Care, Other (non HMO) | Admitting: Family Medicine

## 2021-06-04 VITALS — BP 130/89 | HR 109 | Resp 18 | Wt 343.8 lb

## 2021-06-04 DIAGNOSIS — I5032 Chronic diastolic (congestive) heart failure: Secondary | ICD-10-CM

## 2021-06-04 DIAGNOSIS — Z3042 Encounter for surveillance of injectable contraceptive: Secondary | ICD-10-CM | POA: Diagnosis not present

## 2021-06-04 DIAGNOSIS — Z6841 Body Mass Index (BMI) 40.0 and over, adult: Secondary | ICD-10-CM

## 2021-06-04 DIAGNOSIS — R600 Localized edema: Secondary | ICD-10-CM | POA: Diagnosis not present

## 2021-06-04 MED ORDER — MEDROXYPROGESTERONE ACETATE 150 MG/ML IM SUSP
150.0000 mg | Freq: Once | INTRAMUSCULAR | Status: AC
  Administered 2021-06-04: 150 mg via INTRAMUSCULAR

## 2021-06-04 MED ORDER — OZEMPIC (0.25 OR 0.5 MG/DOSE) 2 MG/1.5ML ~~LOC~~ SOPN: 0.5000 mg | PEN_INJECTOR | SUBCUTANEOUS | 0 refills | Status: DC

## 2021-06-04 MED ORDER — TORSEMIDE 20 MG PO TABS: 40.0000 mg | ORAL_TABLET | Freq: Every day | ORAL | 3 refills | Status: DC

## 2021-06-04 NOTE — Patient Instructions (Signed)
Welcome to [facility]!   You were referred to our medical weight management program by Merita Norton, FNP.   [facility] focusses on improving your overall health through customized nutritional education, exercise, and weight loss.  We also address chronic weight related diseases such as high blood pressure, high blood pressure, high cholesterol, prediabetes, diabetes, joint pain, and many other chronic health conditions.  We believe in using regular every day healthy foods and do not rely on expensive shakes, bars, prepackaged foods, or other supplements.  We accept most insurance plans.   Our clinic is in great demand and we do have an extensive wait list.  The current estimated wait time is at least 10 months out. We wanted to reach out to you to let you know that we do have you on our wait list.  If you DO NOT wish to be on our wait list, please contact [facility] at [phone].     We look forward to serving your health needs and supporting you on your journey to lose weight and to improve your overall health.   Best Regards,   Your [facility]   Stonewood H. Surrett, New Patient Referral Coordinator [facility]   [facility] [address] [place], [place] [address] Main:  [phone]   Direct: [phone]   Fax: [phone] Email: [email or web address]

## 2021-06-04 NOTE — Progress Notes (Signed)
Established patient visit   Patient: [name]   DOB: 11-20-1985   [name]  MRN: [id number] Visit Date: 06/04/2021  Today's healthcare provider: [name], FNP   Chief Complaint  Patient presents with   Follow-up   Subjective    HPI  Follow up for Obesity  The patient was last seen for this 2 months ago. Changes made at last visit include none will continue to monitor, referral placed for weight management on 05/18/21.   She reports fair compliance with treatment. She feels that condition is Unchanged. She is not having side effects.   -----------------------------------------------------------------------------------------   Medications: Outpatient Medications Prior to Visit  Medication Sig   albuterol (VENTOLIN HFA) 108 (90 Base) MCG/ACT inhaler Inhale 2 puffs into the lungs every 6 (six) hours as needed for wheezing or shortness of breath.   benzonatate (TESSALON) 100 MG capsule Take 1 capsule (100 mg total) by mouth 3 (three) times daily as needed.   buPROPion (WELLBUTRIN XL) 150 MG 24 hr tablet Take 1 tablet (150 mg total) by mouth daily.   cyclobenzaprine (FLEXERIL) 10 MG tablet Take 1 tablet (10 mg total) by mouth 3 (three) times daily as needed for muscle spasms.   FLUoxetine (PROZAC) 20 MG tablet Take 1 tablet (20 mg total) by mouth daily.   Insulin Pen Needle (BD PEN NEEDLE NANO 2ND GEN) 32G X 4 MM MISC To use daily with saxenda injection   medroxyPROGESTERone (DEPO-PROVERA) 150 MG/ML injection Inject 150 mg into the muscle every 3 (three) months.   meloxicam (MOBIC) 15 MG tablet Take 1 tablet (15 mg total) by mouth daily.   metFORMIN (GLUCOPHAGE XR) 500 MG 24 hr tablet Take 2 tablets (1,000 mg total) by mouth 2 (two) times daily with breakfast and lunch.   metroNIDAZOLE (METROGEL) 0.75 % vaginal gel Place 1 Applicatorful vaginally 2 (two) times daily as needed.   montelukast (SINGULAIR) 10 MG tablet Take 1 tablet (10 mg total) by mouth at  bedtime as needed.   nystatin cream (MYCOSTATIN) Apply 1 application topically 2 (two) times daily as needed for dry skin.   omeprazole (PRILOSEC) 40 MG capsule Take 1 capsule (40 mg total) daily by mouth.   ondansetron (ZOFRAN ODT) 4 MG disintegrating tablet Take 1 tablet (4 mg total) by mouth every 8 (eight) hours as needed for nausea or vomiting.   Pancrelipase, Lip-Prot-Amyl, (ZENPEP) 40000-126000 units CPEP Take 1 capsule by mouth 3 (three) times daily as needed.   potassium chloride (KLOR-CON) 10 MEQ tablet Take 1 tablet (10 mEq total) by mouth daily.   predniSONE (DELTASONE) 10 MG tablet Take 6 tablets PO on day 1 and day 2, take 5 tablets PO on day 3 and day 4, take 4 tablets PO on day 5 and day 6, take 3 tablets PO on day 7 and day 8, take 2 tablets PO on day 9 and day 10, take one tablet PO on day 11 and day 12.   promethazine-dextromethorphan (PROMETHAZINE-DM) 6.25-15 MG/5ML syrup Take 5 mLs by mouth 4 (four) times daily as needed for cough.   propranolol (INDERAL) 20 MG tablet Take 1 tablet (20 mg total) by mouth 3 (three) times daily as needed. For palpitations   simethicone (MYLICON) 80 MG chewable tablet Chew 1 tablet (80 mg total) by mouth every 6 (six) hours as needed for flatulence.   tiZANidine (ZANAFLEX) 4 MG tablet 1/2-1 q HS prn   Vitamin D, Ergocalciferol, (DRISDOL) 1.25 MG (50000  UNIT) CAPS capsule Take 1 capsule (50,000 Units total) by mouth every 7 (seven) days.   [DISCONTINUED] Semaglutide,0.25 or 0.5MG /DOS, (OZEMPIC, 0.25 OR 0.5 MG/DOSE,) 2 MG/1.5ML SOPN Inject 0.5 mg into the skin once a week.   [DISCONTINUED] torsemide (DEMADEX) 20 MG tablet Take 1 tablet (20 mg total) by mouth daily. May add second pill if needed   fluticasone-salmeterol (ADVAIR DISKUS) 250-50 MCG/ACT AEPB Inhale 1 puff into the lungs 2 (two) times daily.   Facility-Administered Medications Prior to Visit  Medication Dose Route Frequency Provider   bupivacaine (MARCAINE) 0.5 % (with pres) injection 3  mL  3 mL Other Once Tyrell Antonio, MD   medroxyPROGESTERone (DEPO-PROVERA) injection 150 mg  150 mg Intramuscular Once Burnette, Jennifer M, [name]    Review of Systems     Objective    BP 130/89    Pulse (!) 109    Resp 18    Wt (!) 343 lb 12.8 oz (155.9 kg)    SpO2 99%    BMI 67.14 kg/m    Physical Exam Vitals and nursing note reviewed.  Constitutional:      General: She is not in acute distress.    Appearance: Normal appearance. She is obese. She is not ill-appearing, toxic-appearing or diaphoretic.  HENT:     Head: Normocephalic and atraumatic.  Cardiovascular:     Rate and Rhythm: Normal rate and regular rhythm.     Pulses: Normal pulses.     Heart sounds: Normal heart sounds. No murmur heard.   No friction rub. No gallop.  Pulmonary:     Effort: Pulmonary effort is normal. No respiratory distress.     Breath sounds: Normal breath sounds. No stridor. No wheezing, rhonchi or rales.  Chest:     Chest wall: No tenderness.  Abdominal:     General: Bowel sounds are normal.     Palpations: Abdomen is soft.  Musculoskeletal:        General: Swelling present. No tenderness, deformity or signs of injury. Normal range of motion.     Right lower leg: Edema present.     Left lower leg: Edema present.     Comments: +1 to BLE; non pitting  Skin:    General: Skin is warm and dry.     Capillary Refill: Capillary refill takes less than 2 seconds.     Coloration: Skin is not jaundiced or pale.     Findings: No bruising, erythema, lesion or rash.  Neurological:     General: No focal deficit present.     Mental Status: She is alert and oriented to person, place, and time. Mental status is at baseline.     Cranial Nerves: No cranial nerve deficit.     Sensory: No sensory deficit.     Motor: No weakness.     Coordination: Coordination normal.  Psychiatric:        Mood and Affect: Mood normal.        Behavior: Behavior normal.        Thought Content: Thought content normal.         Judgment: Judgment normal.     No results found for any visits on 06/04/21.  Assessment & Plan     Problem List Items Addressed This Visit       Cardiovascular and Mediastinum   Chronic diastolic CHF (congestive heart failure) (HCC)    Chronic, stable Request refill of medication Sees cards q6 months      Relevant Medications  torsemide (DEMADEX) 20 MG tablet     Other   Class 3 severe obesity due to excess calories with serious comorbidity and body mass index (BMI) of 60.0 to 69.9 in adult [facility](HCC)    Repeat referral to additional outside agents Refill of medication to mail in pharm to assist with weight loss per insurance requirements  Slight weight increase on exam today; pt aware of diet changes that need to be made and how the medication was helping when she had supply  Has been out since change in pharm req by insurance Sample prov in clinic today      Relevant Medications   Semaglutide,0.25 or 0.5MG /DOS, (OZEMPIC, 0.25 OR 0.5 MG/DOSE,) 2 MG/1.5ML SOPN   Other Relevant Orders   Amb Ref to Medical Weight Management   Encounter for surveillance of injectable contraceptive - Primary    Injection provided by CMA; does not wish to change despite potential weight gain from use 'has been using this method for years'      Other Visit Diagnoses     Fluid retention in legs       Relevant Medications   torsemide (DEMADEX) 20 MG tablet        Return in about 3 months (around 09/02/2021).      Leilani MerlI, Brianni Manthe T Adelisa Satterwhite, FNP, have reviewed all documentation for this visit. The documentation on 06/04/21 for the exam, diagnosis, procedures, and orders are all accurate and complete.  Patient seen and examined by Merita NortonElise Dynver Clemson,  FNP note scribed by Sheliah HatchKathleen Wolford, NCMA   Jacky KindleElise T Delancey Moraes, FNP  [facility] [phone] (phone) [phone] (fax)  [facility]

## 2021-06-04 NOTE — Assessment & Plan Note (Addendum)
Repeat referral to additional outside agents Refill of medication to mail in pharm to assist with weight loss per insurance requirements  Slight weight increase on exam today; pt aware of diet changes that need to be made and how the medication was helping when she had supply  Has been out since change in pharm req by insurance Sample prov in clinic today

## 2021-06-04 NOTE — Assessment & Plan Note (Signed)
Chronic, stable Request refill of medication Sees cards q6 months

## 2021-06-04 NOTE — Assessment & Plan Note (Signed)
Injection provided by CMA; does not wish to change despite potential weight gain from use 'has been using this method for years'

## 2021-06-07 ENCOUNTER — Telehealth: Payer: Self-pay

## 2021-06-07 NOTE — Telephone Encounter (Signed)
Fax received from insurance for pre-authorization for ozempic, will fill out and leave in your box for review. KW

## 2021-06-07 NOTE — Telephone Encounter (Signed)
[facility] faxed refill request for the following medications: ? ?Semaglutide-Weight Management (WEGOVY) 0.25 MG/0.5ML SOAJ  ? ?Please advise. ? ?

## 2021-06-07 NOTE — Telephone Encounter (Signed)
Form filled out and placed to file in chart, form did not require provider signature, completed form based off notes from last office visit 06/04/21 and office notes faxed to [facility] for review. KW

## 2021-06-09 ENCOUNTER — Encounter: Payer: Self-pay | Admitting: Family Medicine

## 2021-06-11 ENCOUNTER — Other Ambulatory Visit: Payer: Self-pay | Admitting: Family Medicine

## 2021-06-11 NOTE — Telephone Encounter (Signed)
Medication Refill - Medication:  Semaglutide,0.25 or 0.5MG /DOS, (OZEMPIC, 0.25 OR 0.5 MG/DOSE,) 2 MG/1.5ML SOPN   Has the patient contacted their pharmacy? Yes.    Preferred Pharmacy (with phone number or street name):  [facility]) [place], [place] - [address] [facility]  [address] [place] [address], [place] [address]  Phone:  [phone]  Fax:  [phone]   Has the patient been seen for an appointment in the last year OR does the patient have an upcoming appointment? Yes.    Agent: Please be advised that RX refills may take up to 3 business days. We ask that you follow-up with your pharmacy.

## 2021-06-11 NOTE — Telephone Encounter (Signed)
Requested Prescriptions  Pending Prescriptions Disp Refills   Semaglutide,0.25 or 0.5MG /DOS, (OZEMPIC, 0.25 OR 0.5 MG/DOSE,) 2 MG/1.5ML SOPN 6 mL 0    Sig: Inject 0.5 mg into the skin once a week.     Endocrinology:  Diabetes - GLP-1 Receptor Agonists Failed - 06/11/2021  3:20 PM      Failed - HBA1C is between 0 and 7.9 and within 180 days    Hgb A1c MFr Bld  Date Value Ref Range Status  07/24/2017 4.9 4.8 - 5.6 % Final    Comment:             Prediabetes: 5.7 - 6.4          Diabetes: >6.4          Glycemic control for adults with diabetes: <7.0          Passed - Valid encounter within last 6 months    Recent Outpatient Visits          1 week ago Encounter for surveillance of injectable contraceptive   [facility] [name], FNP   2 months ago Nausea   [facility] [name], FNP   3 months ago Encounter for surveillance of injectable contraceptive   [facility] Merita Norton T, FNP   5 months ago Morbid obesity with body mass index (BMI) of 50.0 to 59.9 in adult [facility])   [facility] Malva Limes, MD   7 months ago Chronic cough   [facility] Malva Limes, MD              FIlled at [facility] in [place] last week, this is [facility] [place] requesting.

## 2021-06-13 IMAGING — CR DG CHEST 2V
1 series · 2 of 2 positions shown · non-contrast
Comparison: Chest x-ray 12/15/2016.

CLINICAL DATA: 35-year-old female with history of chronic dry cough
and shortness of breath. History of COVID infection in June 2020.

EXAM:
CHEST - 2 VIEW

[Series 1: dg chest 2 view · 0.14mm/px · 2 of 2 slices shown]
[im 1/2]
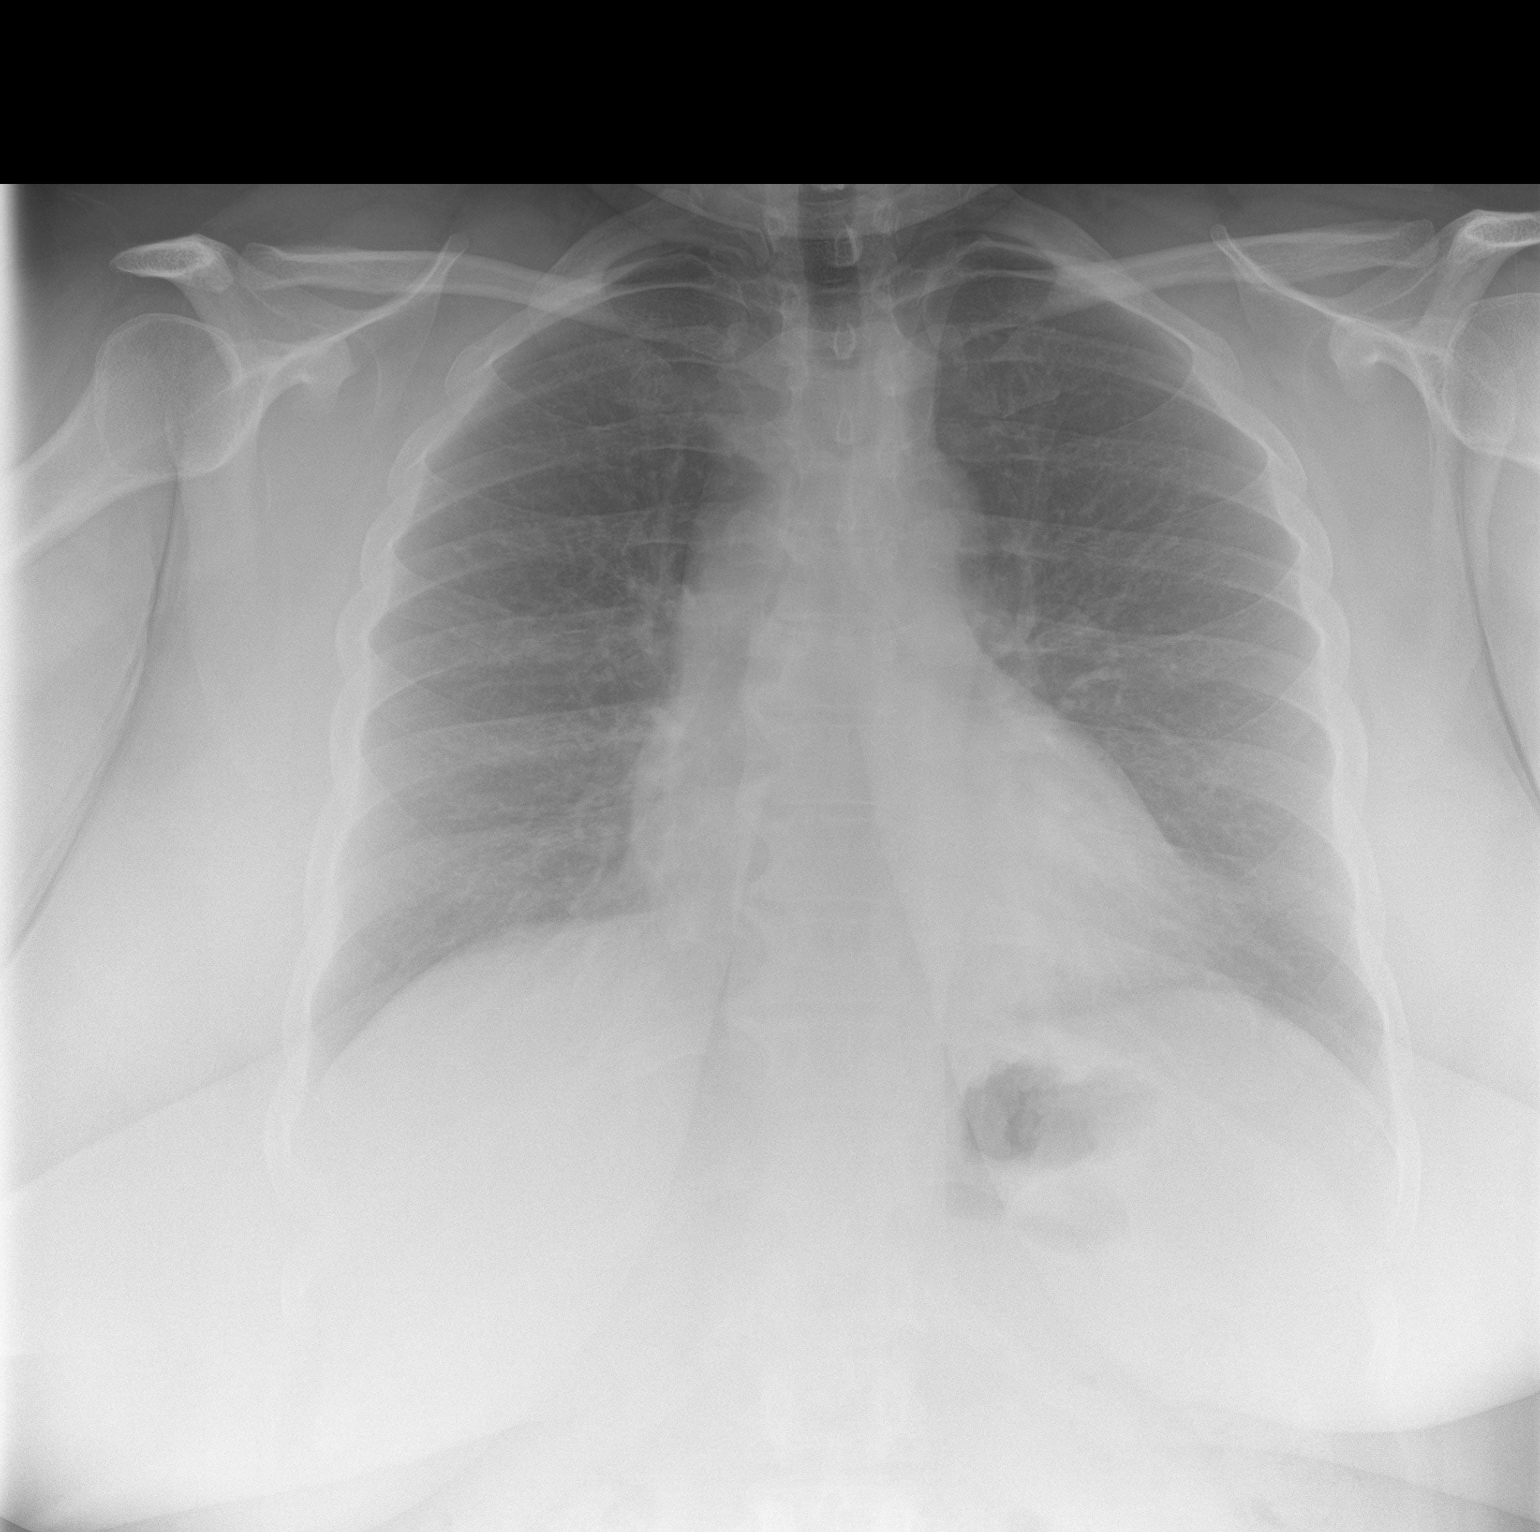
[im 2/2]
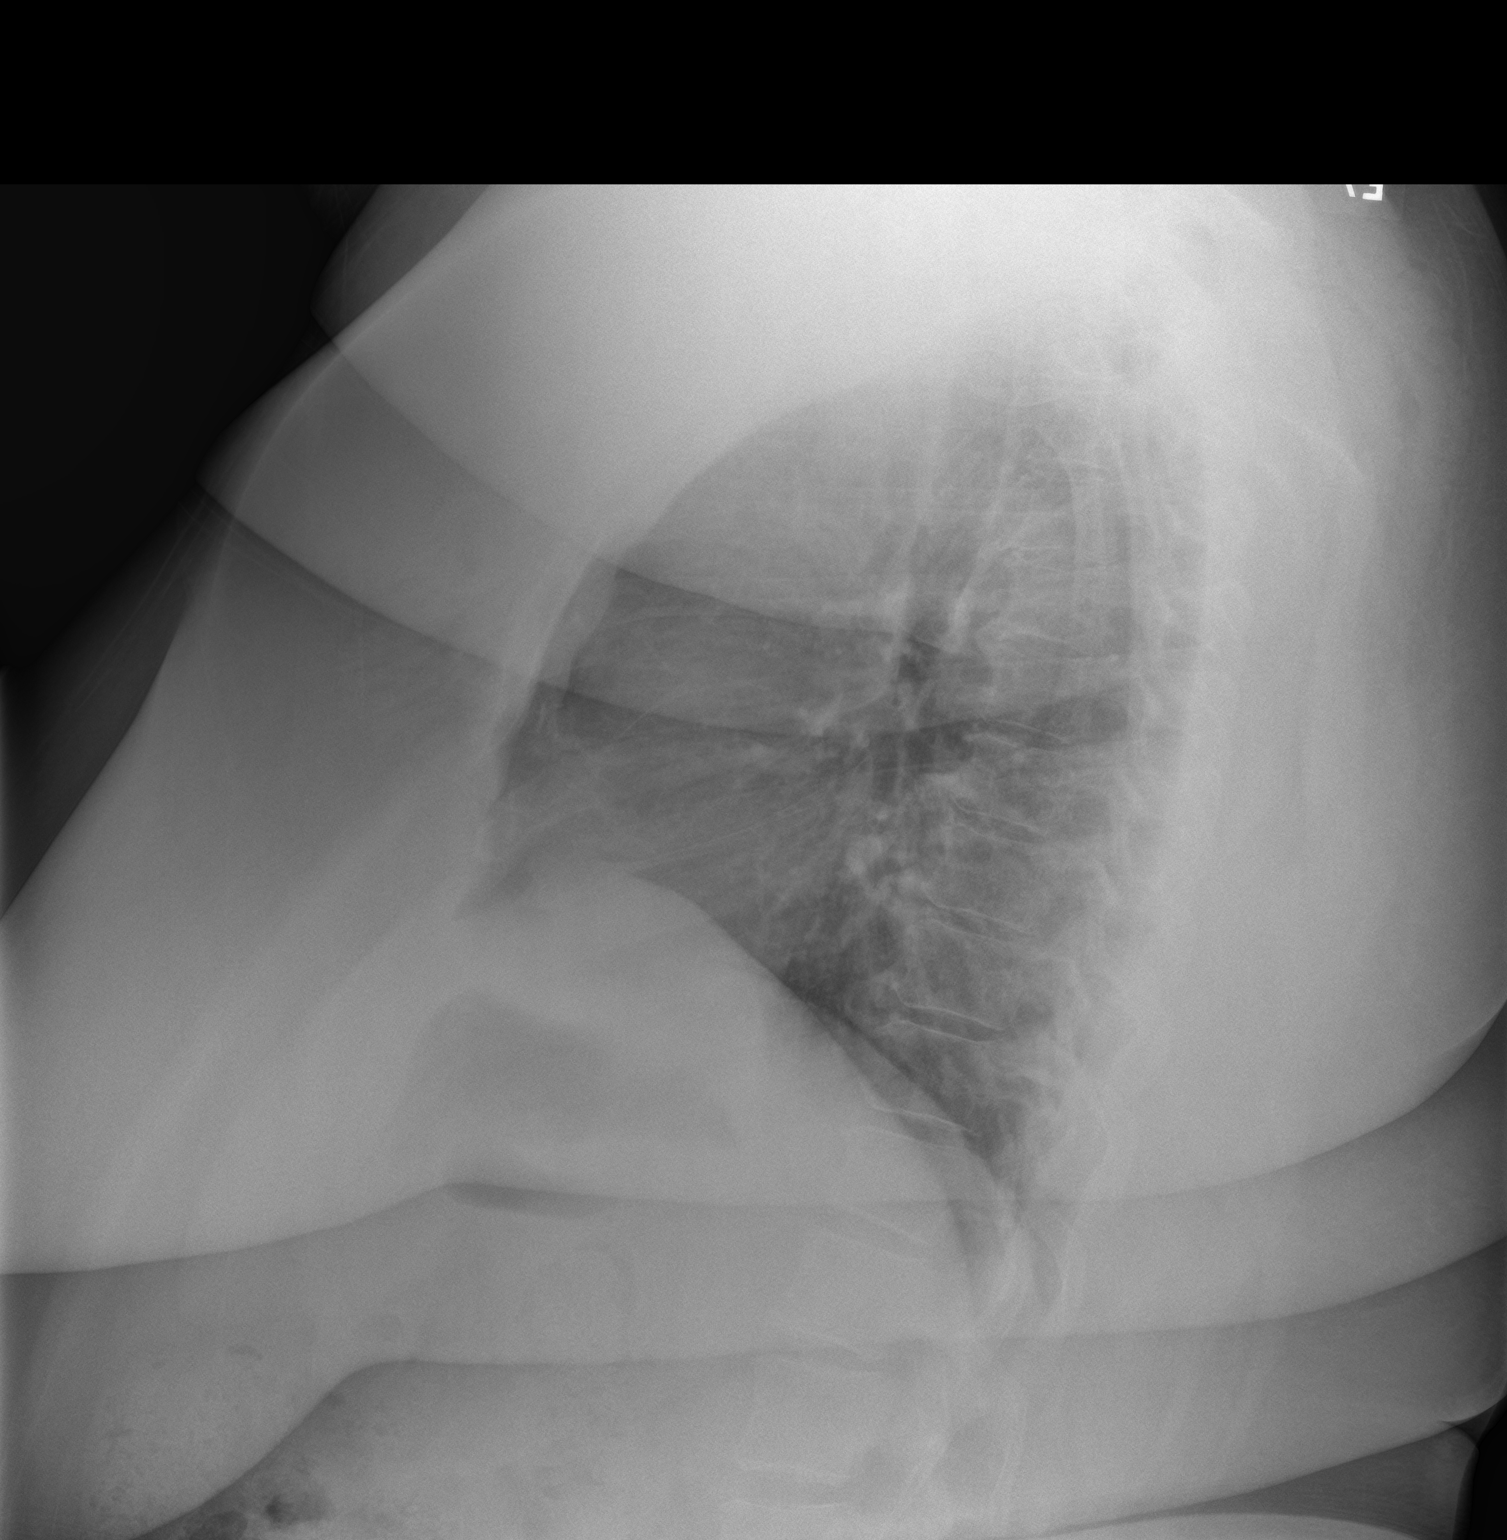

[2 of 2 positions shown; findings below may reference images not displayed]

FINDINGS: Lung volumes are normal. No consolidative airspace disease. No
pleural effusions. No pneumothorax. No pulmonary nodule or mass
noted. Pulmonary vasculature and the cardiomediastinal silhouette
are within normal limits.
IMPRESSION: No radiographic evidence of acute cardiopulmonary disease.

## 2021-06-14 ENCOUNTER — Other Ambulatory Visit: Payer: Self-pay | Admitting: Family Medicine

## 2021-06-14 DIAGNOSIS — E876 Hypokalemia: Secondary | ICD-10-CM

## 2021-06-14 DIAGNOSIS — Z13220 Encounter for screening for lipoid disorders: Secondary | ICD-10-CM

## 2021-06-14 DIAGNOSIS — E559 Vitamin D deficiency, unspecified: Secondary | ICD-10-CM

## 2021-06-14 DIAGNOSIS — Z136 Encounter for screening for cardiovascular disorders: Secondary | ICD-10-CM

## 2021-06-14 DIAGNOSIS — R739 Hyperglycemia, unspecified: Secondary | ICD-10-CM

## 2021-06-16 ENCOUNTER — Other Ambulatory Visit: Payer: Self-pay | Admitting: Family Medicine

## 2021-06-16 DIAGNOSIS — E559 Vitamin D deficiency, unspecified: Secondary | ICD-10-CM | POA: Insufficient documentation

## 2021-06-16 LAB — COMPREHENSIVE METABOLIC PANEL
ALT: 17 IU/L (ref 0–32)
AST: 18 IU/L (ref 0–40)
Albumin/Globulin Ratio: 1.3 (ref 1.2–2.2)
Albumin: 3.7 g/dL — ABNORMAL LOW (ref 3.8–4.8)
Alkaline Phosphatase: 106 IU/L (ref 44–121)
BUN/Creatinine Ratio: 8 — ABNORMAL LOW (ref 9–23)
BUN: 7 mg/dL (ref 6–20)
Bilirubin Total: 0.3 mg/dL (ref 0.0–1.2)
CO2: 22 mmol/L (ref 20–29)
Calcium: 9.2 mg/dL (ref 8.7–10.2)
Chloride: 104 mmol/L (ref 96–106)
Creatinine, Ser: 0.85 mg/dL (ref 0.57–1.00)
Globulin, Total: 2.9 g/dL (ref 1.5–4.5)
Glucose: 98 mg/dL (ref 70–99)
Potassium: 4.6 mmol/L (ref 3.5–5.2)
Sodium: 143 mmol/L (ref 134–144)
Total Protein: 6.6 g/dL (ref 6.0–8.5)
eGFR: 91 mL/min/{1.73_m2} (ref >59)

## 2021-06-16 LAB — LIPID PANEL
Chol/HDL Ratio: 3.4 ratio (ref 0.0–4.4)
Cholesterol, Total: 155 mg/dL (ref 100–199)
HDL: 45 mg/dL (ref >39)
LDL Chol Calc (NIH): 99 mg/dL (ref 0–99)
Triglycerides: 55 mg/dL (ref 0–149)
VLDL Cholesterol Cal: 11 mg/dL (ref 5–40)

## 2021-06-16 LAB — VITAMIN D 25 HYDROXY (VIT D DEFICIENCY, FRACTURES): Vit D, 25-Hydroxy: 11.5 ng/mL — ABNORMAL LOW (ref 30.0–100.0)

## 2021-06-16 LAB — HEMOGLOBIN A1C
Est. average glucose Bld gHb Est-mCnc: 114 mg/dL
Hgb A1c MFr Bld: 5.6 % (ref 4.8–5.6)

## 2021-06-16 MED ORDER — VITAMIN D (ERGOCALCIFEROL) 1.25 MG (50000 UNIT) PO CAPS: 50000.0000 [IU] | ORAL_CAPSULE | ORAL | 3 refills | Status: AC

## 2021-07-22 ENCOUNTER — Other Ambulatory Visit: Payer: Self-pay | Admitting: Family Medicine

## 2021-07-22 MED ORDER — SEMAGLUTIDE-WEIGHT MANAGEMENT 0.5 MG/0.5ML ~~LOC~~ SOAJ: 0.5000 mg | SUBCUTANEOUS | 1 refills | Status: DC

## 2021-08-02 LAB — HM DIABETES EYE EXAM

## 2021-08-12 ENCOUNTER — Telehealth: Payer: Self-pay | Admitting: Family Medicine

## 2021-08-12 NOTE — Telephone Encounter (Signed)
Prescription was sent to pharmacy on 07/22/2021. I called pharmacy and confirmed receipt of prescription. ?

## 2021-08-12 NOTE — Telephone Encounter (Signed)
[facility] faxed refill request for the following medications: ? ?Semaglutide-Weight Management 0.5 MG/0.5ML SOAJ  ? ?Please advise. ? ?

## 2021-08-27 ENCOUNTER — Ambulatory Visit: Payer: Managed Care, Other (non HMO) | Admitting: Family Medicine

## 2021-09-02 ENCOUNTER — Ambulatory Visit (INDEPENDENT_AMBULATORY_CARE_PROVIDER_SITE_OTHER): Payer: Managed Care, Other (non HMO) | Admitting: Family Medicine

## 2021-09-02 DIAGNOSIS — Z3042 Encounter for surveillance of injectable contraceptive: Secondary | ICD-10-CM

## 2021-09-02 MED ORDER — MEDROXYPROGESTERONE ACETATE 150 MG/ML IM SUSP
150.0000 mg | Freq: Once | INTRAMUSCULAR | Status: AC
  Administered 2021-09-02: 150 mg via INTRAMUSCULAR

## 2021-10-09 ENCOUNTER — Encounter: Payer: Self-pay | Admitting: Family Medicine

## 2021-10-12 NOTE — Telephone Encounter (Signed)
Form is ready for fax. EP

## 2021-11-03 ENCOUNTER — Telehealth: Payer: Self-pay

## 2021-11-03 NOTE — Telephone Encounter (Signed)
Copied from CRM [id number]. Topic: Appointment Scheduling - Scheduling Inquiry for Clinic >> Nov 03, 2021 12:14 PM Franchot Heidelberg wrote: Reason for CRM: Pt called and wants to reschedule for July 3rd, please advise   Best contact: [phone]

## 2021-11-22 ENCOUNTER — Ambulatory Visit: Payer: Managed Care, Other (non HMO) | Admitting: Physician Assistant

## 2021-11-22 ENCOUNTER — Ambulatory Visit (INDEPENDENT_AMBULATORY_CARE_PROVIDER_SITE_OTHER): Payer: Managed Care, Other (non HMO) | Admitting: Physician Assistant

## 2021-11-22 DIAGNOSIS — Z3042 Encounter for surveillance of injectable contraceptive: Secondary | ICD-10-CM

## 2021-11-24 ENCOUNTER — Ambulatory Visit: Payer: Managed Care, Other (non HMO) | Admitting: Family Medicine

## 2021-12-14 ENCOUNTER — Ambulatory Visit: Payer: Managed Care, Other (non HMO) | Admitting: Podiatry

## 2021-12-14 ENCOUNTER — Encounter: Payer: Self-pay | Admitting: Podiatry

## 2021-12-14 DIAGNOSIS — B351 Tinea unguium: Secondary | ICD-10-CM | POA: Diagnosis not present

## 2021-12-14 DIAGNOSIS — Z79899 Other long term (current) drug therapy: Secondary | ICD-10-CM

## 2021-12-14 NOTE — Progress Notes (Signed)
Subjective:  Patient ID: [name], female    DOB: [birth year],  MRN: [id number]  Chief Complaint  Patient presents with   Nail Problem    Right hallux nail concerned about the way it is growing     36 y.o. female presents with the above complaint.  Patient presents for bilateral hallux thickened and dystrophic toenails x2.  Patient states that he has thickened dystrophic mycotic she wanted get it evaluated she has not seen anyone as prior to seeing me.  She denies any other acute complaints.  She has not tried any over-the-counter treatment options for nail fungus.  She would like to discuss treatment options.   Review of Systems: Negative except as noted in the HPI. Denies N/V/F/Ch.  Past Medical History:  Diagnosis Date   Anxiety    Depression    Diabetes mellitus without complication (HCC)    Elevated blood pressure    GERD (gastroesophageal reflux disease)    Hypertension    Sleep apnea     Current Outpatient Medications:    albuterol (VENTOLIN HFA) 108 (90 Base) MCG/ACT inhaler, Inhale 2 puffs into the lungs every 6 (six) hours as needed for wheezing or shortness of breath., Disp: 8 g, Rfl: 0   benzonatate (TESSALON) 100 MG capsule, Take 1 capsule (100 mg total) by mouth 3 (three) times daily as needed., Disp: 30 capsule, Rfl: 0   buPROPion (WELLBUTRIN XL) 150 MG 24 hr tablet, Take 1 tablet (150 mg total) by mouth daily., Disp: 90 tablet, Rfl: 0   cyclobenzaprine (FLEXERIL) 10 MG tablet, Take 1 tablet (10 mg total) by mouth 3 (three) times daily as needed for muscle spasms., Disp: 90 tablet, Rfl: 1   FLUoxetine (PROZAC) 20 MG tablet, Take 1 tablet (20 mg total) by mouth daily., Disp: 30 tablet, Rfl: 1   fluticasone-salmeterol (ADVAIR DISKUS) 250-50 MCG/ACT AEPB, Inhale 1 puff into the lungs 2 (two) times daily., Disp: 60 each, Rfl: 0   Insulin Pen Needle (BD PEN NEEDLE NANO 2ND GEN) 32G X 4 MM MISC, To use daily with saxenda injection, Disp: 100 each, Rfl: 3    medroxyPROGESTERone (DEPO-PROVERA) 150 MG/ML injection, Inject 150 mg into the muscle every 3 (three) months., Disp: , Rfl:    meloxicam (MOBIC) 15 MG tablet, Take 1 tablet (15 mg total) by mouth daily., Disp: 90 tablet, Rfl: 1   metFORMIN (GLUCOPHAGE XR) 500 MG 24 hr tablet, Take 2 tablets (1,000 mg total) by mouth 2 (two) times daily with breakfast and lunch., Disp: 360 tablet, Rfl: 0   metroNIDAZOLE (METROGEL) 0.75 % vaginal gel, Place 1 Applicatorful vaginally 2 (two) times daily as needed., Disp: , Rfl:    montelukast (SINGULAIR) 10 MG tablet, Take 1 tablet (10 mg total) by mouth at bedtime as needed., Disp: 30 tablet, Rfl: 0   nystatin cream (MYCOSTATIN), Apply 1 application topically 2 (two) times daily as needed for dry skin., Disp: , Rfl:    omeprazole (PRILOSEC) 40 MG capsule, Take 1 capsule (40 mg total) daily by mouth., Disp: 30 capsule, Rfl: 3   ondansetron (ZOFRAN ODT) 4 MG disintegrating tablet, Take 1 tablet (4 mg total) by mouth every 8 (eight) hours as needed for nausea or vomiting., Disp: 20 tablet, Rfl: 0   Pancrelipase, Lip-Prot-Amyl, (ZENPEP) 40000-126000 units CPEP, Take 1 capsule by mouth 3 (three) times daily as needed., Disp: 84 capsule, Rfl: 0   potassium chloride (KLOR-CON) 10 MEQ tablet, Take 1 tablet (10 mEq total) by mouth daily., Disp:  90 tablet, Rfl: 3   predniSONE (DELTASONE) 10 MG tablet, Take 6 tablets PO on day 1 and day 2, take 5 tablets PO on day 3 and day 4, take 4 tablets PO on day 5 and day 6, take 3 tablets PO on day 7 and day 8, take 2 tablets PO on day 9 and day 10, take one tablet PO on day 11 and day 12., Disp: 42 tablet, Rfl: 0   promethazine-dextromethorphan (PROMETHAZINE-DM) 6.25-15 MG/5ML syrup, Take 5 mLs by mouth 4 (four) times daily as needed for cough., Disp: 118 mL, Rfl: 0   propranolol (INDERAL) 20 MG tablet, Take 1 tablet (20 mg total) by mouth 3 (three) times daily as needed. For palpitations, Disp: 90 tablet, Rfl: 5   Semaglutide-Weight  Management 0.5 MG/0.5ML SOAJ, Inject 0.5 mg into the skin once a week., Disp: 6 mL, Rfl: 1   simethicone (MYLICON) 80 MG chewable tablet, Chew 1 tablet (80 mg total) by mouth every 6 (six) hours as needed for flatulence., Disp: 30 tablet, Rfl: 0   tiZANidine (ZANAFLEX) 4 MG tablet, 1/2-1 q HS prn, Disp: , Rfl:    torsemide (DEMADEX) 20 MG tablet, Take 2 tablets (40 mg total) by mouth daily., Disp: 180 tablet, Rfl: 3   Vitamin D, Ergocalciferol, (DRISDOL) 1.25 MG (50000 UNIT) CAPS capsule, Take 1 capsule (50,000 Units total) by mouth every 7 (seven) days., Disp: 13 capsule, Rfl: 3  Current Facility-Administered Medications:    bupivacaine (MARCAINE) 0.5 % (with pres) injection 3 mL, 3 mL, Other, Once, Tyrell Antonio, MD  Social History   Tobacco Use  Smoking Status Some Days   Types: Cigars  Smokeless Tobacco Never  Tobacco Comments   smokes cigars    Allergies  Allergen Reactions   Lactose Intolerance (Gi) Diarrhea   Penicillins Hives    Has patient had a PCN reaction causing immediate rash, facial/tongue/throat swelling, SOB or lightheadedness with hypotension: Yes Has patient had a PCN reaction causing severe rash involving mucus membranes or skin necrosis: No Has patient had a PCN reaction that required hospitalization No Has patient had a PCN reaction occurring within the last 10 years: Yes If all of the above answers are "NO", then may proceed with Cephalosporin use.   Pineapple Other (See Comments)    Patient states this cuts my mouth and makes it bleed.   Objective:  There were no vitals filed for this visit. There is no height or weight on file to calculate BMI. Constitutional Well developed. Well nourished.  Vascular Dorsalis pedis pulses palpable bilaterally. Posterior tibial pulses palpable bilaterally. Capillary refill normal to all digits.  No cyanosis or clubbing noted. Pedal hair growth normal.  Neurologic Normal speech. Oriented to person, place, and  time. Epicritic sensation to light touch grossly present bilaterally.  Dermatologic Nails thickened elongated dystrophic mycotic bilateral hallux.  Mild pain on palpation. Skin within normal limits  Orthopedic: Normal joint ROM without pain or crepitus bilaterally. No visible deformities. No bony tenderness.   Radiographs: None Assessment:   1. Long-term use of high-risk medication   2. Nail fungus   3. Onychomycosis due to dermatophyte    Plan:  Patient was evaluated and treated and all questions answered.  Bilateral hallux onychomycosis -Educated the patient on the etiology of onychomycosis and various treatment options associated with improving the fungal load.  I explained to the patient that there is 3 treatment options available to treat the onychomycosis including topical, p.o., laser treatment.  Patient elected to  undergo p.o. options with Lamisil/terbinafine therapy.  In order for me to start the medication therapy, I explained to the patient the importance of evaluating the liver and obtaining the liver function test.  Once the liver function test comes back normal I will start him on 42-month course of Lamisil therapy.  Patient understood all risk and would like to proceed with Lamisil therapy.  I have asked the patient to immediately stop the Lamisil therapy if she has any reactions to it and call the office or go to the emergency room right away.  Patient states understanding   No follow-ups on file.

## 2021-12-15 LAB — HEPATIC FUNCTION PANEL
ALT: 18 IU/L (ref 0–32)
AST: 17 IU/L (ref 0–40)
Albumin: 4 g/dL (ref 3.9–4.9)
Alkaline Phosphatase: 100 IU/L (ref 44–121)
Bilirubin Total: 0.3 mg/dL (ref 0.0–1.2)
Bilirubin, Direct: <0.1 mg/dL (ref 0.00–0.40)
Total Protein: 6.6 g/dL (ref 6.0–8.5)

## 2021-12-15 MED ORDER — TERBINAFINE HCL 250 MG PO TABS: 250.0000 mg | ORAL_TABLET | Freq: Every day | ORAL | 0 refills | Status: DC

## 2021-12-15 NOTE — Addendum Note (Signed)
Addended by: Nicholes Rough on: 12/15/2021 08:09 AM   Modules accepted: Orders

## 2021-12-19 NOTE — Progress Notes (Unsigned)
Evaluation Performed:  Follow-up visit  Date:  12/20/2021   ID:  [name], DOB 1985-07-29, MRN [id number]  Patient Location:  [address] [place] [address]   Provider location:   [facility], [place] office  PCP:  [name], FNP  Cardiologist:  Fonnie Mu  Chief Complaint  Patient presents with   12 month follow up     "Doing well." Medications reviewed by the patient verbally.     History of Present Illness:    [name] is a 36 y.o. female  past medical history of Smoker, Reactive airway disease/asthma Morbid obesity  Anxiety OSA, noncompliant with CPAP Chronic diastolic CHF Chronic leg swelling Who presents for follow-up of her fluid retention/leg swelling  Last seen in clinic June 2022 Was working out, got weight down 336, Changed diet Tried prescription weight loss medication Not covered well with insurance  Takes torsemide 40 mg QOD at 5 AM Feels leg swelling is stable Frustrated that weight is not coming down more Weight stable in the past year : 348  Reports that she works from home, going to the gym every other day Works at [employer] reviewed: A1C 5.6 CR 0.85  Breathing stable COVID February 2022  Prior hx of palpitations PAC and PVC before on monitor Not taking propranolol as symptoms are reasonable  EKG personally reviewed by myself on todays visit Shows normal sinus rhythm rate 103 bpm no significant ST-T wave changes  Other past medical history reviewed Previous stress echo September 2017  Dr. Welton Flakes January 19, 2016 showing normal stress test breast attenuation artifact  CT scan chest was performed showing calcium score of 0 normal coronary arteries  Echocardiogram January 26, 2016 showing normal LV function normal right heart pressures mild TR mildly dilated left atrium Diastolic parameters normal  Previous Holter done through [facility] showing normal sinus rhythm APCs  PVCs and   emergency room [facility] 11/13/2017, shortness of breath Chest x-ray was unremarkable Lab work normal, D-dimer negative  Lab work reviewed Creatinine 0.76 BUN 12 Hemoglobin A1c 4.9 LDL 78   Past Medical History:  Diagnosis Date   Anxiety    Depression    Diabetes mellitus without complication (HCC)    Elevated blood pressure    GERD (gastroesophageal reflux disease)    Hypertension    Sleep apnea    Past Surgical History:  Procedure Laterality Date   CESAREAN SECTION     at 28 weeks for pre-eclampsia, pulmonary edema, gestational DM.   dilationand curettage of Uterus  2007   SAB   Headache:2009     Headache wellness consult. during pregnancy     Allergies:   Lactose intolerance (gi), Penicillins, and Pineapple   Social History   Tobacco Use   Smoking status: Some Days    Types: Cigars   Smokeless tobacco: Never   Tobacco comments:    smokes cigars  Vaping Use   Vaping Use: Never used  Substance Use Topics   Alcohol use: Yes    Alcohol/week: 0.0 standard drinks of alcohol    Comment: ocassional alcohol use; Mixed drinks once every 2-3 months   Drug use: No     Current Outpatient Medications on File Prior to Visit  Medication Sig Dispense Refill   albuterol (VENTOLIN HFA) 108 (90 Base) MCG/ACT inhaler Inhale 2 puffs into the lungs every 6 (six) hours as needed for wheezing or shortness of breath. 8 g 0  benzonatate (TESSALON) 100 MG capsule Take 1 capsule (100 mg total) by mouth 3 (three) times daily as needed. 30 capsule 0   buPROPion (WELLBUTRIN XL) 150 MG 24 hr tablet Take 1 tablet (150 mg total) by mouth daily. 90 tablet 0   cyclobenzaprine (FLEXERIL) 10 MG tablet Take 1 tablet (10 mg total) by mouth 3 (three) times daily as needed for muscle spasms. 90 tablet 1   FLUoxetine (PROZAC) 20 MG tablet Take 1 tablet (20 mg total) by mouth daily. 30 tablet 1   fluticasone-salmeterol (ADVAIR DISKUS) 250-50 MCG/ACT AEPB Inhale 1 puff into the lungs 2  (two) times daily. 60 each 0   Insulin Pen Needle (BD PEN NEEDLE NANO 2ND GEN) 32G X 4 MM MISC To use daily with saxenda injection 100 each 3   medroxyPROGESTERone (DEPO-PROVERA) 150 MG/ML injection Inject 150 mg into the muscle every 3 (three) months.     meloxicam (MOBIC) 15 MG tablet Take 1 tablet (15 mg total) by mouth daily. 90 tablet 1   metFORMIN (GLUCOPHAGE XR) 500 MG 24 hr tablet Take 2 tablets (1,000 mg total) by mouth 2 (two) times daily with breakfast and lunch. 360 tablet 0   metroNIDAZOLE (METROGEL) 0.75 % vaginal gel Place 1 Applicatorful vaginally 2 (two) times daily as needed.     montelukast (SINGULAIR) 10 MG tablet Take 1 tablet (10 mg total) by mouth at bedtime as needed. 30 tablet 0   nystatin cream (MYCOSTATIN) Apply 1 application topically 2 (two) times daily as needed for dry skin.     omeprazole (PRILOSEC) 40 MG capsule Take 1 capsule (40 mg total) daily by mouth. 30 capsule 3   ondansetron (ZOFRAN ODT) 4 MG disintegrating tablet Take 1 tablet (4 mg total) by mouth every 8 (eight) hours as needed for nausea or vomiting. 20 tablet 0   Pancrelipase, Lip-Prot-Amyl, (ZENPEP) 40000-126000 units CPEP Take 1 capsule by mouth 3 (three) times daily as needed. 84 capsule 0   potassium chloride (KLOR-CON) 10 MEQ tablet Take 1 tablet (10 mEq total) by mouth daily. 90 tablet 3   promethazine-dextromethorphan (PROMETHAZINE-DM) 6.25-15 MG/5ML syrup Take 5 mLs by mouth 4 (four) times daily as needed for cough. 118 mL 0   propranolol (INDERAL) 20 MG tablet Take 1 tablet (20 mg total) by mouth 3 (three) times daily as needed. For palpitations 90 tablet 5   simethicone (MYLICON) 80 MG chewable tablet Chew 1 tablet (80 mg total) by mouth every 6 (six) hours as needed for flatulence. 30 tablet 0   terbinafine (LAMISIL) 250 MG tablet Take 1 tablet (250 mg total) by mouth daily. 90 tablet 0   tiZANidine (ZANAFLEX) 4 MG tablet 1/2-1 q HS prn     torsemide (DEMADEX) 20 MG tablet Take 2 tablets (40  mg total) by mouth daily. 180 tablet 3   Vitamin D, Ergocalciferol, (DRISDOL) 1.25 MG (50000 UNIT) CAPS capsule Take 1 capsule (50,000 Units total) by mouth every 7 (seven) days. 13 capsule 3   predniSONE (DELTASONE) 10 MG tablet Take 6 tablets PO on day 1 and day 2, take 5 tablets PO on day 3 and day 4, take 4 tablets PO on day 5 and day 6, take 3 tablets PO on day 7 and day 8, take 2 tablets PO on day 9 and day 10, take one tablet PO on day 11 and day 12. (Patient not taking: Reported on 12/20/2021) 42 tablet 0   Semaglutide-Weight Management 0.5 MG/0.5ML SOAJ Inject 0.5 mg into the  skin once a week. (Patient not taking: Reported on 12/20/2021) 6 mL 1   Current Facility-Administered Medications on File Prior to Visit  Medication Dose Route Frequency Provider Last Rate Last Admin   bupivacaine (MARCAINE) 0.5 % (with pres) injection 3 mL  3 mL Other Once Tyrell Antonio, MD         Family Hx: The patient's family history includes Hypertension in her maternal grandmother.  ROS:   Please see the history of present illness.    Review of Systems  Constitutional: Negative.   HENT: Negative.    Respiratory: Negative.    Cardiovascular: Negative.   Gastrointestinal: Negative.   Musculoskeletal: Negative.   Neurological: Negative.   Psychiatric/Behavioral: Negative.    All other systems reviewed and are negative.   Labs/Other Tests and Data Reviewed:    Recent Labs: 12/21/2020: TSH 2.800 04/02/2021: Hemoglobin 12.5; Platelets 282 06/15/2021: BUN 7; Creatinine, Ser 0.85; Potassium 4.6; Sodium 143 12/14/2021: ALT 18   Recent Lipid Panel Lab Results  Component Value Date/Time   CHOL 155 06/15/2021 09:07 AM   TRIG 55 06/15/2021 09:07 AM   HDL 45 06/15/2021 09:07 AM   CHOLHDL 3.4 06/15/2021 09:07 AM   LDLCALC 99 06/15/2021 09:07 AM    Wt Readings from Last 3 Encounters:  12/20/21 (!) 348 lb 8 oz (158.1 kg)  06/04/21 (!) 343 lb 12.8 oz (155.9 kg)  04/05/21 (!) 341 lb 1.6 oz (154.7 kg)      Exam:    BP 138/85 (BP Location: Left Wrist, Patient Position: Sitting, Cuff Size: Normal)   Pulse (!) 103   Ht 5' (1.524 m)   Wt (!) 348 lb 8 oz (158.1 kg)   SpO2 98%   BMI 68.06 kg/m  Constitutional:  oriented to person, place, and time. No distress.  Obese HENT:  Head: Grossly normal Eyes:  no discharge. No scleral icterus.  Neck: No JVD, no carotid bruits  Cardiovascular: Regular rate and rhythm, no murmurs appreciated Pulmonary/Chest: Clear to auscultation bilaterally, no wheezes or rails Abdominal: Soft.  no distension.  no tenderness.  Musculoskeletal: Normal range of motion Neurological:  normal muscle tone. Coordination normal. No atrophy Skin: Skin warm and dry Psychiatric: normal affect, pleasant   ASSESSMENT & PLAN:    Problem List Items Addressed This Visit       Cardiology Problems   Benign essential HTN   Other Visit Diagnoses     Chronic diastolic heart failure (HCC)    -  Primary   Morbid obesity (HCC)       Palpitations       Leg edema         Chronic diastolic CHF (congestive heart failure) (HCC) Exacerbated by morbid obesity Feels euvolemic, minimal swelling Weight stable,  On torsemide 40 QOD,  Echocardiogram offered if symptoms get worse or to update records, she prefers to wait at this time  Morbid obesity (HCC)  weight stable past year Periodically on phentermine in the past Goes to gym qod Difficulty getting qualified for other weight loss medications  Palpitations - Previous Holter monitor 2017 showing APCs and PVCs Has not been taking propranolol as not having significant symptoms Prior event monitor: Patient triggered events associated with PACs, sometimes with atrial tachycardia    Leg edema Exacerbated by morbid obesity, sleep apnea, high fluid intake Stable swelling Continue torsemide 40 mg QOD, weight stable  OSA Not on CPAP   Total encounter time more than 30 minutes  Greater than 50% was spent in  counseling and  coordination of care with the patient   Signed, Julien Nordmann, MD  [facility] [address], [place], [place] [address]

## 2021-12-20 ENCOUNTER — Ambulatory Visit: Payer: Managed Care, Other (non HMO) | Admitting: Cardiovascular Disease

## 2021-12-20 ENCOUNTER — Telehealth: Payer: Self-pay | Admitting: Family Medicine

## 2021-12-20 ENCOUNTER — Other Ambulatory Visit: Payer: Self-pay | Admitting: Family Medicine

## 2021-12-20 ENCOUNTER — Encounter: Payer: Self-pay | Admitting: Cardiovascular Disease

## 2021-12-20 VITALS — BP 138/85 | HR 103 | Ht 60.0 in | Wt 348.5 lb

## 2021-12-20 DIAGNOSIS — I5032 Chronic diastolic (congestive) heart failure: Secondary | ICD-10-CM

## 2021-12-20 DIAGNOSIS — R600 Localized edema: Secondary | ICD-10-CM

## 2021-12-20 DIAGNOSIS — E876 Hypokalemia: Secondary | ICD-10-CM

## 2021-12-20 DIAGNOSIS — R002 Palpitations: Secondary | ICD-10-CM | POA: Diagnosis not present

## 2021-12-20 DIAGNOSIS — I10 Essential (primary) hypertension: Secondary | ICD-10-CM

## 2021-12-20 MED ORDER — PHENTERMINE HCL 37.5 MG PO CAPS: 37.5000 mg | ORAL_CAPSULE | ORAL | 0 refills | Status: DC

## 2021-12-20 MED ORDER — POTASSIUM CHLORIDE ER 10 MEQ PO TBCR: 10.0000 meq | EXTENDED_RELEASE_TABLET | Freq: Every day | ORAL | 3 refills | Status: DC

## 2021-12-20 NOTE — Telephone Encounter (Signed)
I called and spoke with patient. She states Dr. Mariah Milling gave her the "ok" to take phentermine. Per patient, an EKG was performed. She says Dr. Mariah Milling advised her that it was fine to try this medication for weight loss, since she was having problems getting coverage on other weight loss medications. Patient says her pharmacy just called her to let her know the prescription for phentermine was ready. It looks like our office sent in prescription today. Patient plans to pick up prescription today.

## 2021-12-20 NOTE — Patient Instructions (Signed)
Medication Instructions:  No changes  If you need a refill on your cardiac medications before your next appointment, please call your pharmacy.   Lab work: No new labs needed  Testing/Procedures: No new testing needed  Follow-Up: At [facility], you and your health needs are our priority.  As part of our continuing mission to provide you with exceptional heart care, we have created designated Provider Care Teams.  These Care Teams include your primary Cardiologist (physician) and Advanced Practice Providers (APPs -  Physician Assistants and Nurse Practitioners) who all work together to provide you with the care you need, when you need it.  You will need a follow up appointment in 12 months  Providers on your designated Care Team:   Christopher Berge, NP Ryan Dunn, [name] Cadence Furth, [name]  COVID-19 Vaccine Information can be found at: [email or web address] For questions related to vaccine distribution or appointments, please email [email or web address] or call [phone].   

## 2021-12-20 NOTE — Telephone Encounter (Signed)
Pt is calling to let Robynn Pane know that cardiology approved her to start weight loss medication. Please advise  CB- [phone]

## 2022-01-12 ENCOUNTER — Encounter: Payer: Self-pay | Admitting: Podiatry

## 2022-01-13 ENCOUNTER — Other Ambulatory Visit: Payer: Self-pay | Admitting: Podiatry

## 2022-01-13 MED ORDER — CICLOPIROX 8 % EX SOLN: Freq: Every day | CUTANEOUS | 0 refills | Status: DC

## 2022-02-18 ENCOUNTER — Ambulatory Visit: Payer: Managed Care, Other (non HMO) | Admitting: Family Medicine

## 2022-02-24 ENCOUNTER — Ambulatory Visit: Payer: Managed Care, Other (non HMO) | Admitting: Family Medicine

## 2022-02-24 DIAGNOSIS — Z3042 Encounter for surveillance of injectable contraceptive: Secondary | ICD-10-CM | POA: Diagnosis not present

## 2022-02-24 MED ORDER — MEDROXYPROGESTERONE ACETATE 150 MG/ML IM SUSP
150.0000 mg | Freq: Once | INTRAMUSCULAR | Status: AC
  Administered 2022-02-24: 150 mg via INTRAMUSCULAR

## 2022-02-24 NOTE — Progress Notes (Signed)
Patient is here for Depo shot.  Patient seen my CMA and was not assessed by NP. Gwyneth Sprout, [address] [place], [place] [address] [phone] (phone) [phone] (fax) [place]

## 2022-04-12 ENCOUNTER — Ambulatory Visit: Payer: Managed Care, Other (non HMO) | Admitting: Podiatry

## 2022-05-12 ENCOUNTER — Ambulatory Visit (INDEPENDENT_AMBULATORY_CARE_PROVIDER_SITE_OTHER): Payer: Managed Care, Other (non HMO) | Admitting: Family Medicine

## 2022-05-12 DIAGNOSIS — Z3042 Encounter for surveillance of injectable contraceptive: Secondary | ICD-10-CM

## 2022-05-12 MED ORDER — MEDROXYPROGESTERONE ACETATE 150 MG/ML IM SUSP
150.0000 mg | Freq: Once | INTRAMUSCULAR | Status: AC
  Administered 2022-05-12: 150 mg via INTRAMUSCULAR

## 2022-05-12 NOTE — Progress Notes (Signed)
Nurse visit  Pt here for Depo-provera injection.  Due for next injection March 8-22nd 2023

## 2022-05-19 ENCOUNTER — Telehealth: Payer: Managed Care, Other (non HMO) | Admitting: Physician Assistant

## 2022-05-19 DIAGNOSIS — J208 Acute bronchitis due to other specified organisms: Secondary | ICD-10-CM | POA: Diagnosis not present

## 2022-05-19 MED ORDER — ALBUTEROL SULFATE HFA 108 (90 BASE) MCG/ACT IN AERS: 2.0000 | INHALATION_SPRAY | Freq: Four times a day (QID) | RESPIRATORY_TRACT | 0 refills | Status: DC | PRN

## 2022-05-19 MED ORDER — PREDNISONE 20 MG PO TABS: 40.0000 mg | ORAL_TABLET | Freq: Every day | ORAL | 0 refills | Status: DC

## 2022-05-19 MED ORDER — BENZONATATE 100 MG PO CAPS: 100.0000 mg | ORAL_CAPSULE | Freq: Three times a day (TID) | ORAL | 0 refills | Status: DC | PRN

## 2022-05-19 NOTE — Patient Instructions (Signed)
Satira Anis, thank you for joining [name], [name] for today's virtual visit.  While this provider is not your primary care provider (PCP), if your PCP is located in our provider database this encounter information will be shared with them immediately following your visit.   A [facility] MyChart account gives you access to today's visit and all your visits, tests, and labs performed at [facility] " click here if you don't have a [facility] MyChart account or go to [email or web address]  Consent: (Patient) [name] provided verbal consent for this virtual visit at the beginning of the encounter.  Current Medications:  Current Outpatient Medications:    albuterol (VENTOLIN HFA) 108 (90 Base) MCG/ACT inhaler, Inhale 2 puffs into the lungs every 6 (six) hours as needed for wheezing or shortness of breath., Disp: 8 g, Rfl: 0   benzonatate (TESSALON) 100 MG capsule, Take 1 capsule (100 mg total) by mouth 3 (three) times daily as needed., Disp: 30 capsule, Rfl: 0   buPROPion (WELLBUTRIN XL) 150 MG 24 hr tablet, Take 1 tablet (150 mg total) by mouth daily., Disp: 90 tablet, Rfl: 0   ciclopirox (PENLAC) 8 % solution, Apply topically at bedtime. Apply over nail and surrounding skin. Apply daily over previous coat. After seven (7) days, may remove with alcohol and continue cycle., Disp: 6.6 mL, Rfl: 0   cyclobenzaprine (FLEXERIL) 10 MG tablet, Take 1 tablet (10 mg total) by mouth 3 (three) times daily as needed for muscle spasms., Disp: 90 tablet, Rfl: 1   FLUoxetine (PROZAC) 20 MG tablet, Take 1 tablet (20 mg total) by mouth daily., Disp: 30 tablet, Rfl: 1   fluticasone-salmeterol (ADVAIR DISKUS) 250-50 MCG/ACT AEPB, Inhale 1 puff into the lungs 2 (two) times daily., Disp: 60 each, Rfl: 0   Insulin Pen Needle (BD PEN NEEDLE NANO 2ND GEN) 32G X 4 MM MISC, To use daily with saxenda injection, Disp: 100 each, Rfl: 3   medroxyPROGESTERone (DEPO-PROVERA) 150 MG/ML  injection, Inject 150 mg into the muscle every 3 (three) months., Disp: , Rfl:    meloxicam (MOBIC) 15 MG tablet, Take 1 tablet (15 mg total) by mouth daily., Disp: 90 tablet, Rfl: 1   metFORMIN (GLUCOPHAGE XR) 500 MG 24 hr tablet, Take 2 tablets (1,000 mg total) by mouth 2 (two) times daily with breakfast and lunch., Disp: 360 tablet, Rfl: 0   metroNIDAZOLE (METROGEL) 0.75 % vaginal gel, Place 1 Applicatorful vaginally 2 (two) times daily as needed., Disp: , Rfl:    montelukast (SINGULAIR) 10 MG tablet, Take 1 tablet (10 mg total) by mouth at bedtime as needed., Disp: 30 tablet, Rfl: 0   nystatin cream (MYCOSTATIN), Apply 1 application topically 2 (two) times daily as needed for dry skin., Disp: , Rfl:    omeprazole (PRILOSEC) 40 MG capsule, Take 1 capsule (40 mg total) daily by mouth., Disp: 30 capsule, Rfl: 3   ondansetron (ZOFRAN ODT) 4 MG disintegrating tablet, Take 1 tablet (4 mg total) by mouth every 8 (eight) hours as needed for nausea or vomiting., Disp: 20 tablet, Rfl: 0   Pancrelipase, Lip-Prot-Amyl, (ZENPEP) 40000-126000 units CPEP, Take 1 capsule by mouth 3 (three) times daily as needed., Disp: 84 capsule, Rfl: 0   phentermine 37.5 MG capsule, Take 1 capsule (37.5 mg total) by mouth every morning., Disp: 90 capsule, Rfl: 0   potassium chloride (KLOR-CON) 10 MEQ tablet, Take 1 tablet (10 mEq total) by mouth daily., Disp: 90 tablet, Rfl: 3   predniSONE (  DELTASONE) 10 MG tablet, Take 6 tablets PO on day 1 and day 2, take 5 tablets PO on day 3 and day 4, take 4 tablets PO on day 5 and day 6, take 3 tablets PO on day 7 and day 8, take 2 tablets PO on day 9 and day 10, take one tablet PO on day 11 and day 12. (Patient not taking: Reported on 12/20/2021), Disp: 42 tablet, Rfl: 0   promethazine-dextromethorphan (PROMETHAZINE-DM) 6.25-15 MG/5ML syrup, Take 5 mLs by mouth 4 (four) times daily as needed for cough., Disp: 118 mL, Rfl: 0   propranolol (INDERAL) 20 MG tablet, Take 1 tablet (20 mg total)  by mouth 3 (three) times daily as needed. For palpitations, Disp: 90 tablet, Rfl: 5   Semaglutide-Weight Management 0.5 MG/0.5ML SOAJ, Inject 0.5 mg into the skin once a week. (Patient not taking: Reported on 12/20/2021), Disp: 6 mL, Rfl: 1   simethicone (MYLICON) 80 MG chewable tablet, Chew 1 tablet (80 mg total) by mouth every 6 (six) hours as needed for flatulence., Disp: 30 tablet, Rfl: 0   terbinafine (LAMISIL) 250 MG tablet, Take 1 tablet (250 mg total) by mouth daily., Disp: 90 tablet, Rfl: 0   tiZANidine (ZANAFLEX) 4 MG tablet, 1/2-1 q HS prn, Disp: , Rfl:    torsemide (DEMADEX) 20 MG tablet, Take 2 tablets (40 mg total) by mouth daily., Disp: 180 tablet, Rfl: 3   Vitamin D, Ergocalciferol, (DRISDOL) 1.25 MG (50000 UNIT) CAPS capsule, Take 1 capsule (50,000 Units total) by mouth every 7 (seven) days., Disp: 13 capsule, Rfl: 3  Current Facility-Administered Medications:    bupivacaine (MARCAINE) 0.5 % (with pres) injection 3 mL, 3 mL, Other, Once, Tyrell Antonio, MD   Medications ordered in this encounter:  No orders of the defined types were placed in this encounter.    *If you need refills on other medications prior to your next appointment, please contact your pharmacy*  Follow-Up: Call back or seek an in-person evaluation if the symptoms worsen or if the condition fails to improve as anticipated.   Virtual Care [phone]  Other Instructions We are sorry that you are not feeling well.  Here is how we plan to help!  Based on your presentation I believe you most likely have A cough due to a virus.  This is called viral bronchitis and is best treated by rest, plenty of fluids and control of the cough.  You may use Ibuprofen or Tylenol as directed to help your symptoms.     I have sent in a prescription cough medication, an albuterol inhaler and prednisone for you to take as directed.  From your responses in the eVisit questionnaire you describe inflammation in the  upper respiratory tract which is causing a significant cough.  This is commonly called Bronchitis and has four common causes:   Allergies Viral Infections Acid Reflux Bacterial Infection Allergies, viruses and acid reflux are treated by controlling symptoms or eliminating the cause. An example might be a cough caused by taking certain blood pressure medications. You stop the cough by changing the medication. Another example might be a cough caused by acid reflux. Controlling the reflux helps control the cough.  USE OF BRONCHODILATOR ("RESCUE") INHALERS: There is a risk from using your bronchodilator too frequently.  The risk is that over-reliance on a medication which only relaxes the muscles surrounding the breathing tubes can reduce the effectiveness of medications prescribed to reduce swelling and congestion of the tubes themselves.  Although you feel brief relief from the bronchodilator inhaler, your asthma may actually be worsening with the tubes becoming more swollen and filled with mucus.  This can delay other crucial treatments, such as oral steroid medications. If you need to use a bronchodilator inhaler daily, several times per day, you should discuss this with your provider.  There are probably better treatments that could be used to keep your asthma under control.     HOME CARE Only take medications as instructed by your medical team. Complete the entire course of an antibiotic. Drink plenty of fluids and get plenty of rest. Avoid close contacts especially the very young and the elderly Cover your mouth if you cough or cough into your sleeve. Always remember to wash your hands A steam or ultrasonic humidifier can help congestion.   GET HELP RIGHT AWAY IF: You develop worsening fever. You become short of breath You cough up blood. Your symptoms persist after you have completed your treatment plan MAKE SURE YOU  Understand these instructions. Will watch your condition. Will get  help right away if you are not doing well or get worse.     If you have been instructed to have an in-person evaluation today at a local Urgent Care facility, please use the link below. It will take you to a list of all of our available [facility], including address, phone number and hours of operation. Please do not delay care.  [facility]  If you or a family member do not have a primary care provider, use the link below to schedule a visit and establish care. When you choose a [facility] primary care physician or advanced practice provider, you gain a long-term partner in health. Find a Primary Care Provider  Learn more about [facility] in-office and virtual care options: [place] - Get Care Now

## 2022-05-19 NOTE — Progress Notes (Signed)
Virtual Visit Consent   [name], you are scheduled for a virtual visit with a [facility] provider today. Just as with appointments in the office, your consent must be obtained to participate. Your consent will be active for this visit and any virtual visit you may have with one of our providers in the next 365 days. If you have a MyChart account, a copy of this consent can be sent to you electronically.  As this is a virtual visit, video technology does not allow for your provider to perform a traditional examination. This may limit your provider's ability to fully assess your condition. If your provider identifies any concerns that need to be evaluated in person or the need to arrange testing (such as labs, EKG, etc.), we will make arrangements to do so. Although advances in technology are sophisticated, we cannot ensure that it will always work on either your end or our end. If the connection with a video visit is poor, the visit may have to be switched to a telephone visit. With either a video or telephone visit, we are not always able to ensure that we have a secure connection.  By engaging in this virtual visit, you consent to the provision of healthcare and authorize for your insurance to be billed (if applicable) for the services provided during this visit. Depending on your insurance coverage, you may receive a charge related to this service.  I need to obtain your verbal consent now. Are you willing to proceed with your visit today? [name] has provided verbal consent on 05/19/2022 for a virtual visit (video or telephone). [name], [place]  Date: 05/19/2022 7:26 PM  Virtual Visit via Video Note   I, [name], connected with  [name]  ([id number], October 23, 1985) on 05/19/22 at  7:30 PM EST by a video-enabled telemedicine application and verified that I am speaking with the correct person using two identifiers.  Location: Patient: Virtual Visit  Location Patient: Home Provider: Virtual Visit Location Provider: Home Office   I discussed the limitations of evaluation and management by telemedicine and the availability of in person appointments. The patient expressed understanding and agreed to proceed.    History of Present Illness: [name] is a 36 y.o. who identifies as a female who was assigned female at birth, and is being seen today for > 1 week of URI symptoms including nasal congestion and a persistent dry cough with some associated sob during coughing spell, worsening on 12/24 with increase in cough severity with headache.. cough remains dry.   HPI: HPI  Problems:  Patient Active Problem List   Diagnosis Date Noted   Avitaminosis D 06/16/2021   Dehydration 04/05/2021   Flatulence, eructation, and gas pain 04/05/2021   Nausea 04/05/2021   Encounter for examination following treatment at hospital 04/05/2021   Chronic pain of both knees 03/11/2021   Encounter for surveillance of injectable contraceptive 03/11/2021   Chronic diastolic CHF (congestive heart failure) (HCC) 08/08/2018   Asthma exacerbation 12/28/2017   Benign essential HTN 05/04/2016   History of pre-eclampsia 02/12/2015   Abnormal vaginal Pap smear 02/05/2015   Abortion, spontaneous 02/05/2015   Anxiety 02/05/2015   LBP (low back pain) 02/05/2015   Fatigue 02/05/2015   Fibrositis 02/05/2015   Folliculitis 02/05/2015   Acid reflux 02/05/2015   History of diabetes mellitus arising in pregnancy 02/05/2015   High risk sexual behavior 02/05/2015   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/2015   Class 3 severe  obesity due to excess calories with serious comorbidity and body mass index (BMI) of 60.0 to 69.9 in adult [facility]) 02/05/2015   Obstructive sleep apnea 02/05/2015   Vitamin D deficiency 02/05/2015   Depression 02/05/2015   Fibromyalgia 02/05/2015   Abnormal C-reactive protein 10/24/2012   Chronic low back pain 08/13/2012   Absence of menstruation 09/22/2009    Migraine without status migrainosus 07/22/2008    Allergies:  Allergies  Allergen Reactions   Lactose Intolerance (Gi) Diarrhea   Penicillins Hives    Has patient had a PCN reaction causing immediate rash, facial/tongue/throat swelling, SOB or lightheadedness with hypotension: Yes Has patient had a PCN reaction causing severe rash involving mucus membranes or skin necrosis: No Has patient had a PCN reaction that required hospitalization No Has patient had a PCN reaction occurring within the last 10 years: Yes If all of the above answers are "NO", then may proceed with Cephalosporin use.   Pineapple Other (See Comments)    Patient states this cuts my mouth and makes it bleed.   Medications:  Current Outpatient Medications:    albuterol (VENTOLIN HFA) 108 (90 Base) MCG/ACT inhaler, Inhale 2 puffs into the lungs every 6 (six) hours as needed for wheezing or shortness of breath., Disp: 8 g, Rfl: 0   benzonatate (TESSALON) 100 MG capsule, Take 1 capsule (100 mg total) by mouth 3 (three) times daily as needed for cough., Disp: 30 capsule, Rfl: 0   predniSONE (DELTASONE) 20 MG tablet, Take 2 tablets (40 mg total) by mouth daily with breakfast., Disp: 10 tablet, Rfl: 0   buPROPion (WELLBUTRIN XL) 150 MG 24 hr tablet, Take 1 tablet (150 mg total) by mouth daily., Disp: 90 tablet, Rfl: 0   ciclopirox (PENLAC) 8 % solution, Apply topically at bedtime. Apply over nail and surrounding skin. Apply daily over previous coat. After seven (7) days, may remove with alcohol and continue cycle., Disp: 6.6 mL, Rfl: 0   cyclobenzaprine (FLEXERIL) 10 MG tablet, Take 1 tablet (10 mg total) by mouth 3 (three) times daily as needed for muscle spasms., Disp: 90 tablet, Rfl: 1   FLUoxetine (PROZAC) 20 MG tablet, Take 1 tablet (20 mg total) by mouth daily., Disp: 30 tablet, Rfl: 1   fluticasone-salmeterol (ADVAIR DISKUS) 250-50 MCG/ACT AEPB, Inhale 1 puff into the lungs 2 (two) times daily., Disp: 60 each, Rfl: 0    Insulin Pen Needle (BD PEN NEEDLE NANO 2ND GEN) 32G X 4 MM MISC, To use daily with saxenda injection, Disp: 100 each, Rfl: 3   medroxyPROGESTERone (DEPO-PROVERA) 150 MG/ML injection, Inject 150 mg into the muscle every 3 (three) months., Disp: , Rfl:    meloxicam (MOBIC) 15 MG tablet, Take 1 tablet (15 mg total) by mouth daily., Disp: 90 tablet, Rfl: 1   metFORMIN (GLUCOPHAGE XR) 500 MG 24 hr tablet, Take 2 tablets (1,000 mg total) by mouth 2 (two) times daily with breakfast and lunch., Disp: 360 tablet, Rfl: 0   metroNIDAZOLE (METROGEL) 0.75 % vaginal gel, Place 1 Applicatorful vaginally 2 (two) times daily as needed., Disp: , Rfl:    montelukast (SINGULAIR) 10 MG tablet, Take 1 tablet (10 mg total) by mouth at bedtime as needed., Disp: 30 tablet, Rfl: 0   nystatin cream (MYCOSTATIN), Apply 1 application topically 2 (two) times daily as needed for dry skin., Disp: , Rfl:    omeprazole (PRILOSEC) 40 MG capsule, Take 1 capsule (40 mg total) daily by mouth., Disp: 30 capsule, Rfl: 3   ondansetron (ZOFRAN  ODT) 4 MG disintegrating tablet, Take 1 tablet (4 mg total) by mouth every 8 (eight) hours as needed for nausea or vomiting., Disp: 20 tablet, Rfl: 0   Pancrelipase, Lip-Prot-Amyl, (ZENPEP) 40000-126000 units CPEP, Take 1 capsule by mouth 3 (three) times daily as needed., Disp: 84 capsule, Rfl: 0   phentermine 37.5 MG capsule, Take 1 capsule (37.5 mg total) by mouth every morning., Disp: 90 capsule, Rfl: 0   potassium chloride (KLOR-CON) 10 MEQ tablet, Take 1 tablet (10 mEq total) by mouth daily., Disp: 90 tablet, Rfl: 3   propranolol (INDERAL) 20 MG tablet, Take 1 tablet (20 mg total) by mouth 3 (three) times daily as needed. For palpitations, Disp: 90 tablet, Rfl: 5   simethicone (MYLICON) 80 MG chewable tablet, Chew 1 tablet (80 mg total) by mouth every 6 (six) hours as needed for flatulence., Disp: 30 tablet, Rfl: 0   tiZANidine (ZANAFLEX) 4 MG tablet, 1/2-1 q HS prn, Disp: , Rfl:    torsemide  (DEMADEX) 20 MG tablet, Take 2 tablets (40 mg total) by mouth daily., Disp: 180 tablet, Rfl: 3   Vitamin D, Ergocalciferol, (DRISDOL) 1.25 MG (50000 UNIT) CAPS capsule, Take 1 capsule (50,000 Units total) by mouth every 7 (seven) days., Disp: 13 capsule, Rfl: 3  Current Facility-Administered Medications:    bupivacaine (MARCAINE) 0.5 % (with pres) injection 3 mL, 3 mL, Other, Once, Tyrell Antonio, MD  Observations/Objective: Patient is well-developed, well-nourished in no acute distress.  Resting comfortably at home.  Head is normocephalic, atraumatic.  No labored breathing. Speech is clear and coherent with logical content.  Patient is alert and oriented at baseline.   Assessment and Plan: 1. Viral bronchitis - benzonatate (TESSALON) 100 MG capsule; Take 1 capsule (100 mg total) by mouth 3 (three) times daily as needed for cough.  Dispense: 30 capsule; Refill: 0 - albuterol (VENTOLIN HFA) 108 (90 Base) MCG/ACT inhaler; Inhale 2 puffs into the lungs every 6 (six) hours as needed for wheezing or shortness of breath.  Dispense: 8 g; Refill: 0 - predniSONE (DELTASONE) 20 MG tablet; Take 2 tablets (40 mg total) by mouth daily with breakfast.  Dispense: 10 tablet; Refill: 0  Rx albuterol.  Increase fluids.  Rest.  Saline nasal spray.  Probiotic.  Mucinex as directed.  Humidifier in bedroom. Rx Prednisone and tessalon per orders.  Call or return to clinic if symptoms are not improving.   Follow Up Instructions: I discussed the assessment and treatment plan with the patient. The patient was provided an opportunity to ask questions and all were answered. The patient agreed with the plan and demonstrated an understanding of the instructions.  A copy of instructions were sent to the patient via MyChart unless otherwise noted below.   The patient was advised to call back or seek an in-person evaluation if the symptoms worsen or if the condition fails to improve as anticipated.  Time:  I spent 10  minutes with the patient via telehealth technology discussing the above problems/concerns.    Melanie Climes, [name]

## 2022-05-20 ENCOUNTER — Ambulatory Visit: Payer: Managed Care, Other (non HMO) | Admitting: Family Medicine

## 2022-05-24 ENCOUNTER — Encounter: Payer: Self-pay | Admitting: Family Medicine

## 2022-05-24 ENCOUNTER — Telehealth (INDEPENDENT_AMBULATORY_CARE_PROVIDER_SITE_OTHER): Payer: Managed Care, Other (non HMO) | Admitting: Family Medicine

## 2022-05-24 ENCOUNTER — Ambulatory Visit: Payer: Self-pay | Admitting: *Deleted

## 2022-05-24 VITALS — HR 102 | Temp 96.4°F | Wt 331.0 lb

## 2022-05-24 DIAGNOSIS — J0140 Acute pansinusitis, unspecified: Secondary | ICD-10-CM | POA: Insufficient documentation

## 2022-05-24 MED ORDER — AZITHROMYCIN 250 MG PO TABS: ORAL_TABLET | ORAL | 0 refills | Status: AC

## 2022-05-24 MED ORDER — DOXYCYCLINE HYCLATE 100 MG PO TABS: 100.0000 mg | ORAL_TABLET | Freq: Two times a day (BID) | ORAL | 0 refills | Status: DC

## 2022-05-24 MED ORDER — MONTELUKAST SODIUM 10 MG PO TABS: 10.0000 mg | ORAL_TABLET | Freq: Every evening | ORAL | 0 refills | Status: AC | PRN

## 2022-05-24 MED ORDER — HYDROCOD POLI-CHLORPHE POLI ER 10-8 MG/5ML PO SUER: 5.0000 mL | Freq: Two times a day (BID) | ORAL | 0 refills | Status: DC | PRN

## 2022-05-24 NOTE — Assessment & Plan Note (Signed)
Acute, not improved with use of prednisone taper and tessalon Was previously seen virtual Home COVID negative; no sick contacts Did not receive recent seasonal vaccinations Recommend ABX with close follow up given hx of CHF Allergy to PCN; ABX modified to provide similar coverage Recommend restart of Singulair with use of PRN cough syrup

## 2022-05-24 NOTE — Progress Notes (Signed)
I,Connie R Striblin,acting as a Neurosurgeon for [name], FNP.,have documented all relevant documentation on the behalf of [name], FNP,as directed by  [name], FNP while in the presence of [name], FNP.  MyChart Video Visit    Virtual Visit via Video Note   This format is felt to be most appropriate for this patient at this time. Physical exam was limited by quality of the video and audio technology used for the visit.   Patient location: home Provider location: [facility]  I discussed the limitations of evaluation and management by telemedicine and the availability of in person appointments. The patient expressed understanding and agreed to proceed.  Patient: [name]   DOB: 10-16-1985   37 y.o. Female  MRN: [id number] Visit Date: 05/24/2022  Today's healthcare provider: [name], FNP  Re Introduced to nurse practitioner role and practice setting.  All questions answered.  Discussed provider/patient relationship and expectations.  No chief complaint on file.  Subjective    HPI  Cough: Patient complains of cough. Symptoms began 1 week ago. Cough described as non-productive. Patient denies chills and fever. Associated symptoms include headache, bilateral ear pain, sinus pressure, and headaches.  Patient denies dyspnea. current treatments have included  prednisone , with fair improvement.   Pt says its worse at night. Pt seen [facility] 1 week ago and was prescribed prednisone.  Medications: Outpatient Medications Prior to Visit  Medication Sig   albuterol (VENTOLIN HFA) 108 (90 Base) MCG/ACT inhaler Inhale 2 puffs into the lungs every 6 (six) hours as needed for wheezing or shortness of breath.   benzonatate (TESSALON) 100 MG capsule Take 1 capsule (100 mg total) by mouth 3 (three) times daily as needed for cough.   buPROPion (WELLBUTRIN XL) 150 MG 24 hr tablet Take 1 tablet (150 mg total) by mouth daily.   ciclopirox (PENLAC) 8 % solution  Apply topically at bedtime. Apply over nail and surrounding skin. Apply daily over previous coat. After seven (7) days, may remove with alcohol and continue cycle.   cyclobenzaprine (FLEXERIL) 10 MG tablet Take 1 tablet (10 mg total) by mouth 3 (three) times daily as needed for muscle spasms.   FLUoxetine (PROZAC) 20 MG tablet Take 1 tablet (20 mg total) by mouth daily.   medroxyPROGESTERone (DEPO-PROVERA) 150 MG/ML injection Inject 150 mg into the muscle every 3 (three) months.   meloxicam (MOBIC) 15 MG tablet Take 1 tablet (15 mg total) by mouth daily.   metFORMIN (GLUCOPHAGE XR) 500 MG 24 hr tablet Take 2 tablets (1,000 mg total) by mouth 2 (two) times daily with breakfast and lunch.   metroNIDAZOLE (METROGEL) 0.75 % vaginal gel Place 1 Applicatorful vaginally 2 (two) times daily as needed.   nystatin cream (MYCOSTATIN) Apply 1 application topically 2 (two) times daily as needed for dry skin.   omeprazole (PRILOSEC) 40 MG capsule Take 1 capsule (40 mg total) daily by mouth.   ondansetron (ZOFRAN ODT) 4 MG disintegrating tablet Take 1 tablet (4 mg total) by mouth every 8 (eight) hours as needed for nausea or vomiting.   Pancrelipase, Lip-Prot-Amyl, (ZENPEP) 40000-126000 units CPEP Take 1 capsule by mouth 3 (three) times daily as needed.   phentermine 37.5 MG capsule Take 1 capsule (37.5 mg total) by mouth every morning.   potassium chloride (KLOR-CON) 10 MEQ tablet Take 1 tablet (10 mEq total) by mouth daily.   predniSONE (DELTASONE) 20 MG tablet Take 2 tablets (40 mg  total) by mouth daily with breakfast.   propranolol (INDERAL) 20 MG tablet Take 1 tablet (20 mg total) by mouth 3 (three) times daily as needed. For palpitations   simethicone (MYLICON) 80 MG chewable tablet Chew 1 tablet (80 mg total) by mouth every 6 (six) hours as needed for flatulence.   tiZANidine (ZANAFLEX) 4 MG tablet 1/2-1 q HS prn   torsemide (DEMADEX) 20 MG tablet Take 2 tablets (40 mg total) by mouth daily.   Vitamin D,  Ergocalciferol, (DRISDOL) 1.25 MG (50000 UNIT) CAPS capsule Take 1 capsule (50,000 Units total) by mouth every 7 (seven) days.   [DISCONTINUED] montelukast (SINGULAIR) 10 MG tablet Take 1 tablet (10 mg total) by mouth at bedtime as needed.   fluticasone-salmeterol (ADVAIR DISKUS) 250-50 MCG/ACT AEPB Inhale 1 puff into the lungs 2 (two) times daily.   Insulin Pen Needle (BD PEN NEEDLE NANO 2ND GEN) 32G X 4 MM MISC To use daily with saxenda injection   Facility-Administered Medications Prior to Visit  Medication Dose Route Frequency Provider   bupivacaine (MARCAINE) 0.5 % (with pres) injection 3 mL  3 mL Other Once Magnus Sinning, MD    Review of Systems   Objective    Pulse (!) 102   Temp (!) 96.4 F (35.8 C)   Wt (!) 331 lb (150.1 kg)   SpO2 96%   BMI 64.64 kg/m   Physical Exam Constitutional:      General: She is not in acute distress.    Appearance: Normal appearance. She is obese. She is not ill-appearing or toxic-appearing.  HENT:     Nose: Rhinorrhea present.     Right Sinus: Maxillary sinus tenderness and frontal sinus tenderness present.     Left Sinus: Maxillary sinus tenderness and frontal sinus tenderness present.     Comments: Clear drainage per pt report  Sinus symptoms worse R>L Neck:     Comments: Pt reports tenderness behind R ear Pulmonary:     Effort: Pulmonary effort is normal.     Comments: Pt reports active wheezing; not heard over AV connection Worse symptoms at night with coughing  Neurological:     General: No focal deficit present.     Mental Status: She is alert and oriented to person, place, and time. Mental status is at baseline.  Psychiatric:        Mood and Affect: Mood normal.        Behavior: Behavior normal.        Thought Content: Thought content normal.        Judgment: Judgment normal.      Assessment & Plan     Problem List Items Addressed This Visit       Respiratory   Acute non-recurrent pansinusitis - Primary    Acute,  not improved with use of prednisone taper and tessalon Was previously seen virtual Home COVID negative; no sick contacts Did not receive recent seasonal vaccinations Recommend ABX with close follow up given hx of CHF Allergy to PCN; ABX modified to provide similar coverage Recommend restart of Singulair with use of PRN cough syrup      Relevant Medications   doxycycline (VIBRA-TABS) 100 MG tablet   azithromycin (ZITHROMAX) 250 MG tablet   montelukast (SINGULAIR) 10 MG tablet   chlorpheniramine-HYDROcodone (TUSSIONEX) 10-8 MG/5ML   Return in about 3 days (around 05/27/2022), or if symptoms worsen or fail to improve.    I discussed the assessment and treatment plan with the patient. The patient was provided  an opportunity to ask questions and all were answered. The patient agreed with the plan and demonstrated an understanding of the instructions.   The patient was advised to call back or seek an in-person evaluation if the symptoms worsen or if the condition fails to improve as anticipated.  I provided 10 minutes of face-to-face time during this encounter reviewing current symptoms, previous symptoms and attempting course of treatment.  Vonna Kotyk, FNP, have reviewed all documentation for this visit. The documentation on 05/24/22 for the exam, diagnosis, procedures, and orders are all accurate and complete.  Gwyneth Sprout, [place] [phone] (phone) [phone] (fax)  [place]

## 2022-05-24 NOTE — Telephone Encounter (Signed)
  Chief Complaint: Cough, congestion Symptoms: Sinus pain, earache, cough, chest congestion, LGT Frequency: 05/19/22 did [facility] VV, last day of prednisone. Pertinent Negatives: Patient denies SOB (SOB with coughing) Disposition: [] ED /[] Urgent Care (no appt availability in office) / [x] Appointment(In office/virtual)/ []  [facility]/ [] Home Care/ [] Refused Recommended Disposition /[] [place] [facility]/ []  Follow-up with PCP Additional Notes: VV secured for this AM, care advise provided, pt verbalizes understanding.  Reason for Disposition  Earache is present  Answer Assessment - Initial Assessment Questions 1. ONSET: "When did the cough begin?"      [facility] 05/19/22 2. SEVERITY: "How bad is the cough today?"      BAd at night 3. SPUTUM: "Describe the color of your sputum" (none, dry cough; clear, white, yellow, green)     Unsure 4. HEMOPTYSIS: "Are you coughing up any blood?" If so ask: "How much?" (flecks, streaks, tablespoons, etc.)     Unsure 5. DIFFICULTY BREATHING: "Are you having difficulty breathing?" If Yes, ask: "How bad is it?" (e.g., mild, moderate, severe)    - MILD: No SOB at rest, mild SOB with walking, speaks normally in sentences, can lie down, no retractions, pulse < 100.    - MODERATE: SOB at rest, SOB with minimal exertion and prefers to sit, cannot lie down flat, speaks in phrases, mild retractions, audible wheezing, pulse 100-120.    - SEVERE: Very SOB at rest, speaks in single words, struggling to breathe, sitting hunched forward, retractions, pulse > 120      With coughing  6. FEVER: "Do you have a fever?" If Yes, ask: "What is your temperature, how was it measured, and when did it start?"     99-100. 100 max last week. Has not checked since 10. OTHER SYMPTOMS: "Do you have any other symptoms?" (e.g., runny nose, wheezing, chest pain)       Cough worse at night, ears clogged, right earache,sinus pain off and on Frontal headache, "Maybe wheezing  breathing in and out."  Protocols used: Cough - Acute Non-Productive-A-AH

## 2022-06-10 ENCOUNTER — Ambulatory Visit (INDEPENDENT_AMBULATORY_CARE_PROVIDER_SITE_OTHER): Payer: Managed Care, Other (non HMO) | Admitting: Family Medicine

## 2022-06-10 ENCOUNTER — Encounter: Payer: Self-pay | Admitting: Family Medicine

## 2022-06-10 VITALS — BP 142/92 | HR 102 | Temp 98.1°F | Wt 344.1 lb

## 2022-06-10 DIAGNOSIS — I10 Essential (primary) hypertension: Secondary | ICD-10-CM

## 2022-06-10 DIAGNOSIS — K2100 Gastro-esophageal reflux disease with esophagitis, without bleeding: Secondary | ICD-10-CM | POA: Diagnosis not present

## 2022-06-10 DIAGNOSIS — J4541 Moderate persistent asthma with (acute) exacerbation: Secondary | ICD-10-CM

## 2022-06-10 MED ORDER — PREDNISONE 10 MG PO TABS: ORAL_TABLET | ORAL | 0 refills | Status: DC

## 2022-06-10 MED ORDER — PANTOPRAZOLE SODIUM 40 MG PO TBEC: 40.0000 mg | DELAYED_RELEASE_TABLET | Freq: Two times a day (BID) | ORAL | 3 refills | Status: DC

## 2022-06-10 MED ORDER — TRELEGY ELLIPTA 200-62.5-25 MCG/ACT IN AEPB: 1.0000 | INHALATION_SPRAY | Freq: Every day | RESPIRATORY_TRACT | 12 refills | Status: DC

## 2022-06-10 MED ORDER — CARVEDILOL 3.125 MG PO TABS: 3.1250 mg | ORAL_TABLET | Freq: Two times a day (BID) | ORAL | 1 refills | Status: DC

## 2022-06-10 MED ORDER — PHENTERMINE HCL 37.5 MG PO CAPS: 37.5000 mg | ORAL_CAPSULE | ORAL | 0 refills | Status: DC

## 2022-06-10 MED ORDER — BUPROPION HCL ER (XL) 150 MG PO TB24: 150.0000 mg | ORAL_TABLET | Freq: Every day | ORAL | 0 refills | Status: DC

## 2022-06-10 NOTE — Assessment & Plan Note (Signed)
Acute on chronic with recent exacerbation Recommend titration from prilosec to protonix BID

## 2022-06-10 NOTE — Progress Notes (Signed)
I,Connie R Striblin,acting as a Neurosurgeon for [name], FNP.,have documented all relevant documentation on the behalf of [name], FNP,as directed by  [name], FNP while in the presence of [name], FNP.   Established patient visit   Patient: [name]   DOB: 1985/12/31   37 y.o. Female  MRN: [id number] Visit Date: 06/10/2022  Today's healthcare provider: [name], FNP  Re Introduced to nurse practitioner role and practice setting.  All questions answered.  Discussed provider/patient relationship and expectations.   Chief Complaint  Patient presents with   Cough        Subjective    HPI HPI     Cough    Additional comments:        Last edited by Consuello Closs, CMA on 06/10/2022  1:04 PM.      Cough: Patient complains of cough. Symptoms began 1 month ago. Cough described as non-productive. Patient denies dyspnea. Associated symptoms include wheezing. Patient has a history of bronchiolitis. Current treatments have included  antibiotics, tussionex , tessalon pearls, and prednisone with fair improvement.   Pt was seen for cough previously, but states that the cough is not improving.  Last fever was 05/23/22 Medications:  Outpatient Medications Prior to Visit  Medication Sig   albuterol (VENTOLIN HFA) 108 (90 Base) MCG/ACT inhaler Inhale 2 puffs into the lungs every 6 (six) hours as needed for wheezing or shortness of breath.   benzonatate (TESSALON) 100 MG capsule Take 1 capsule (100 mg total) by mouth 3 (three) times daily as needed for cough.   chlorpheniramine-HYDROcodone (TUSSIONEX) 10-8 MG/5ML Take 5 mLs by mouth every 12 (twelve) hours as needed for cough.   ciclopirox (PENLAC) 8 % solution Apply topically at bedtime. Apply over nail and surrounding skin. Apply daily over previous coat. After seven (7) days, may remove with alcohol and continue cycle.   cyclobenzaprine (FLEXERIL) 10 MG tablet Take 1 tablet (10 mg total) by mouth 3  (three) times daily as needed for muscle spasms.   FLUoxetine (PROZAC) 20 MG tablet Take 1 tablet (20 mg total) by mouth daily.   medroxyPROGESTERone (DEPO-PROVERA) 150 MG/ML injection Inject 150 mg into the muscle every 3 (three) months.   meloxicam (MOBIC) 15 MG tablet Take 1 tablet (15 mg total) by mouth daily.   metFORMIN (GLUCOPHAGE XR) 500 MG 24 hr tablet Take 2 tablets (1,000 mg total) by mouth 2 (two) times daily with breakfast and lunch.   metroNIDAZOLE (METROGEL) 0.75 % vaginal gel Place 1 Applicatorful vaginally 2 (two) times daily as needed.   montelukast (SINGULAIR) 10 MG tablet Take 1 tablet (10 mg total) by mouth at bedtime as needed.   nystatin cream (MYCOSTATIN) Apply 1 application topically 2 (two) times daily as needed for dry skin.   omeprazole (PRILOSEC) 40 MG capsule Take 1 capsule (40 mg total) daily by mouth.   ondansetron (ZOFRAN ODT) 4 MG disintegrating tablet Take 1 tablet (4 mg total) by mouth every 8 (eight) hours as needed for nausea or vomiting.   Pancrelipase, Lip-Prot-Amyl, (ZENPEP) 40000-126000 units CPEP Take 1 capsule by mouth 3 (three) times daily as needed.   potassium chloride (KLOR-CON) 10 MEQ tablet Take 1 tablet (10 mEq total) by mouth daily.   predniSONE (DELTASONE) 20 MG tablet Take 2 tablets (40 mg total) by mouth daily with breakfast.   propranolol (INDERAL) 20 MG tablet Take 1 tablet (20 mg total) by mouth 3 (three) times  daily as needed. For palpitations   simethicone (MYLICON) 80 MG chewable tablet Chew 1 tablet (80 mg total) by mouth every 6 (six) hours as needed for flatulence.   tiZANidine (ZANAFLEX) 4 MG tablet 1/2-1 q HS prn   torsemide (DEMADEX) 20 MG tablet Take 2 tablets (40 mg total) by mouth daily.   Vitamin D, Ergocalciferol, (DRISDOL) 1.25 MG (50000 UNIT) CAPS capsule Take 1 capsule (50,000 Units total) by mouth every 7 (seven) days.   [DISCONTINUED] phentermine 37.5 MG capsule Take 1 capsule (37.5 mg total) by mouth every morning.    doxycycline (VIBRA-TABS) 100 MG tablet Take 1 tablet (100 mg total) by mouth 2 (two) times daily. (Patient not taking: Reported on 06/10/2022)   fluticasone-salmeterol (ADVAIR DISKUS) 250-50 MCG/ACT AEPB Inhale 1 puff into the lungs 2 (two) times daily.   [DISCONTINUED] buPROPion (WELLBUTRIN XL) 150 MG 24 hr tablet Take 1 tablet (150 mg total) by mouth daily. (Patient not taking: Reported on 06/10/2022)   [DISCONTINUED] Insulin Pen Needle (BD PEN NEEDLE NANO 2ND GEN) 32G X 4 MM MISC To use daily with saxenda injection   Facility-Administered Medications Prior to Visit  Medication Dose Route Frequency Provider   bupivacaine (MARCAINE) 0.5 % (with pres) injection 3 mL  3 mL Other Once Magnus Sinning, MD   Review of Systems     Objective    BP (!) 142/92 Comment (BP Location): L forearm  Pulse (!) 102   Temp 98.1 F (36.7 C) (Oral)   Wt (!) 344 lb 1.6 oz (156.1 kg)   SpO2 99%   BMI 67.20 kg/m    Physical Exam Vitals and nursing note reviewed.  Constitutional:      General: She is not in acute distress.    Appearance: Normal appearance. She is obese. She is not ill-appearing, toxic-appearing or diaphoretic.  HENT:     Head: Normocephalic and atraumatic.     Right Ear: Tympanic membrane, ear canal and external ear normal.     Left Ear: Tympanic membrane, ear canal and external ear normal.     Nose: Nose normal.     Mouth/Throat:     Mouth: Mucous membranes are moist.     Pharynx: Oropharynx is clear. No posterior oropharyngeal erythema.  Eyes:     Extraocular Movements: Extraocular movements intact.     Pupils: Pupils are equal, round, and reactive to light.  Cardiovascular:     Rate and Rhythm: Normal rate and regular rhythm.     Pulses: Normal pulses.     Heart sounds: Normal heart sounds. No murmur heard.    No friction rub. No gallop.  Pulmonary:     Effort: Pulmonary effort is normal. No respiratory distress.     Breath sounds: Normal breath sounds. No stridor. No  wheezing, rhonchi or rales.  Chest:     Chest wall: No tenderness.  Musculoskeletal:        General: No swelling, tenderness, deformity or signs of injury. Normal range of motion.     Cervical back: Neck supple. No tenderness.     Right lower leg: No edema.     Left lower leg: No edema.  Skin:    General: Skin is warm and dry.     Capillary Refill: Capillary refill takes less than 2 seconds.     Coloration: Skin is not jaundiced or pale.     Findings: No bruising, erythema, lesion or rash.  Neurological:     General: No focal deficit present.  Mental Status: She is alert and oriented to person, place, and time. Mental status is at baseline.     Cranial Nerves: No cranial nerve deficit.     Sensory: No sensory deficit.     Motor: No weakness.     Coordination: Coordination normal.  Psychiatric:        Mood and Affect: Mood normal.        Behavior: Behavior normal.        Thought Content: Thought content normal.        Judgment: Judgment normal.     No results found for any visits on 06/10/22.  Assessment & Plan     Problem List Items Addressed This Visit       Cardiovascular and Mediastinum   Primary hypertension    Has not been adherent to use of diuretic given recent use of short term medications Encouraged to f/u with cardiology given elevated HR and BP      Relevant Medications   carvedilol (COREG) 3.125 MG tablet   Other Relevant Orders   Ambulatory referral to Cardiology     Respiratory   Moderate persistent asthma with acute exacerbation - Primary    Chronic, slight improvement Redose steroids and start daily inhaler Referral to pulm and cardiology      Relevant Medications   Fluticasone-Umeclidin-Vilant (TRELEGY ELLIPTA) 200-62.5-25 MCG/ACT AEPB   predniSONE (DELTASONE) 10 MG tablet   Other Relevant Orders   Ambulatory referral to Pulmonology   Ambulatory referral to Cardiology     Digestive   Acid reflux    Acute on chronic with recent  exacerbation Recommend titration from prilosec to protonix BID      Relevant Medications   pantoprazole (PROTONIX) 40 MG tablet   Other Relevant Orders   Ambulatory referral to Pulmonology     Other   Morbid obesity (HCC)    Chronic, stable Body mass index is 67.2 kg/m. Continue to recommend balanced, lower carb meals. Smaller meal size, adding snacks. Choosing water as drink of choice and increasing purposeful exercise. Wishes to continue phentermine and wellbutrin to assist      Relevant Medications   buPROPion (WELLBUTRIN XL) 150 MG 24 hr tablet   phentermine 37.5 MG capsule   Other Relevant Orders   Ambulatory referral to Pulmonology   Ambulatory referral to Cardiology   Return in about 4 weeks (around 07/08/2022) for blood pressure check, HTN management.     Vonna Kotyk, FNP, have reviewed all documentation for this visit. The documentation on 06/10/22 for the exam, diagnosis, procedures, and orders are all accurate and complete.  Gwyneth Sprout, [place] [phone] (phone) [phone] (fax)  [place]

## 2022-06-10 NOTE — Assessment & Plan Note (Signed)
Chronic, slight improvement Redose steroids and start daily inhaler Referral to pulm and cardiology

## 2022-06-10 NOTE — Assessment & Plan Note (Signed)
Has not been adherent to use of diuretic given recent use of short term medications Encouraged to f/u with cardiology given elevated HR and BP

## 2022-06-10 NOTE — Assessment & Plan Note (Addendum)
Chronic, stable Body mass index is 67.2 kg/m. Continue to recommend balanced, lower carb meals. Smaller meal size, adding snacks. Choosing water as drink of choice and increasing purposeful exercise. Wishes to continue phentermine and wellbutrin to assist

## 2022-06-11 ENCOUNTER — Other Ambulatory Visit: Payer: Self-pay | Admitting: Family Medicine

## 2022-06-11 DIAGNOSIS — I5032 Chronic diastolic (congestive) heart failure: Secondary | ICD-10-CM

## 2022-06-11 DIAGNOSIS — R600 Localized edema: Secondary | ICD-10-CM

## 2022-06-13 ENCOUNTER — Telehealth: Payer: Self-pay | Admitting: Cardiovascular Disease

## 2022-06-13 NOTE — Telephone Encounter (Signed)
Pt c/o medication issue:  1. Name of Medication: carvedilol (COREG) 3.125 MG tablet ;  torsemide (DEMADEX) 20 MG tablet  2. How are you currently taking this medication (dosage and times per day)? N/A  3. Are you having a reaction (difficulty breathing--STAT)? No   4. What is your medication issue? Patient scared about taking the both medication together

## 2022-06-13 NOTE — Telephone Encounter (Signed)
Spoke with patient and reviewed her concerns in detail. Provided reassurance regarding her medications and when to take them. She verbalized understanding with no further questions at this time.

## 2022-06-24 ENCOUNTER — Encounter: Payer: Self-pay | Admitting: Medical

## 2022-06-24 ENCOUNTER — Ambulatory Visit: Payer: Managed Care, Other (non HMO) | Attending: Medical | Admitting: Medical

## 2022-06-24 VITALS — BP 141/91 | HR 98 | Ht 60.0 in | Wt 346.0 lb

## 2022-06-24 DIAGNOSIS — I10 Essential (primary) hypertension: Secondary | ICD-10-CM

## 2022-06-24 DIAGNOSIS — G4733 Obstructive sleep apnea (adult) (pediatric): Secondary | ICD-10-CM

## 2022-06-24 DIAGNOSIS — I5032 Chronic diastolic (congestive) heart failure: Secondary | ICD-10-CM

## 2022-06-24 DIAGNOSIS — R600 Localized edema: Secondary | ICD-10-CM | POA: Diagnosis not present

## 2022-06-24 MED ORDER — TORSEMIDE 20 MG PO TABS: 40.0000 mg | ORAL_TABLET | Freq: Every day | ORAL | 3 refills | Status: DC

## 2022-06-24 NOTE — Progress Notes (Unsigned)
Cardiology Office Note:    Date:  06/24/2022   ID:  JULIA ALKHATIB, DOB [birth year], MRN [id number]  PCP:  Gwyneth Sprout, FNP  [facility] Cardiologist:  Ida Rogue, MD  [facility] Electrophysiologist:  None   Referring MD: Gwyneth Sprout, FNP   Chief Complaint: 6 month follow-up  History of Present Illness:    [name] is a 37 y.o. female with a hx of smoker, reactive airway disease/asthma, morbid obesity, anxiety, OSA noncompliant with CPAP, h/o palpitations, chronic diastolic CHF, chronic leg swelling who presents for 6 month follow-up.   Previous stress echo 01/2016 was normal with exception of breast attenuation artifact. Echo 01/2016 showed normal LVEF, normal right heart pressures, mild TR, mildly dilated left atrium. She had cardiac CTA with a calcium score of 0 per previous documentation. Previous cardiac monitor through [facility] in 2017 with infrequent PAC/PVCs.  The patient was last seen 11/2021 and was overall doing well from a cardiac standpoint.  Today, the patient reports she has been having high BP and headaches. This started last month at which time she was sick and taking a lot of medications for the illness. She noted BP continued to be high and she saw PCP, who prescribed Coreg, but has not taken it. She needs a refill torsemide. She is planning on repeating a sleep study for OSA. The patient denies chest pain, SOB. No change in LLE.   Past Medical History:  Diagnosis Date   Anxiety    Depression    Diabetes mellitus without complication (HCC)    Elevated blood pressure    GERD (gastroesophageal reflux disease)    Hypertension    Sleep apnea     Past Surgical History:  Procedure Laterality Date   CESAREAN SECTION     at 28 weeks for pre-eclampsia, pulmonary edema, gestational DM.   dilationand curettage of Uterus  2007   SAB   Headache:2009     Headache wellness consult. during pregnancy    Current Medications: Current Meds  Medication Sig    predniSONE (DELTASONE) 10 MG tablet Day 1 & 2 take 6 tablets Day 3 &4 take 5 tablets Day 5 &6 take 4 tablets Day 7 & 8 take 3 tablets Day 9 & 10 take 2 tablets Day 11 & 12 take 1 tablet Day 13 & 14 take 1/2 tablet   Current Facility-Administered Medications for the 06/24/22 encounter (Office Visit) with Kathlen Mody, Milli Woolridge H, [name]  Medication   bupivacaine (MARCAINE) 0.5 % (with pres) injection 3 mL     Allergies:   Lactose intolerance (gi), Penicillins, and Pineapple   Social History   Socioeconomic History   Marital status: Single    Spouse name: Not on file   Number of children: 1   Years of education: high schoo   Highest education level: Not on file  Occupational History   Not on file  Tobacco Use   Smoking status: Some Days    Types: Cigars   Smokeless tobacco: Never   Tobacco comments:    smokes cigars on occ  Vaping Use   Vaping Use: Never used  Substance and Sexual Activity   Alcohol use: Yes    Alcohol/week: 0.0 standard drinks of alcohol    Comment: ocassional alcohol use; Mixed drinks once every 2-3 months   Drug use: No   Sexual activity: Yes  Other Topics Concern   Not on file  Social History Narrative   Not on file   Social  Determinants of Health   Financial Resource Strain: Not on file  Food Insecurity: Not on file  Transportation Needs: Not on file  Physical Activity: Not on file  Stress: Not on file  Social Connections: Not on file     Family History: The patient's family history includes Hypertension in her maternal grandmother.  ROS:   Please see the history of present illness.     All other systems reviewed and are negative.  EKGs/Labs/Other Studies Reviewed:    The following studies were reviewed today:  Echo 2010 Left ventricle: The cavity size was normal. Systolic function was   normal. The estimated ejection fraction was in the range of 55% to   60%. Images were inadequate for LV wall motion assessment. The study   is not technically  sufficient to allow evaluation of LV diastolic   function.   Aortic valve: Normal thickness leaflets. Doppler: There was no   stenosis. No significant regurgitation.   Aorta: The aorta was poorly visualized.   Mitral valve: The valve appears to be grossly normal. Doppler: There   was no evidence for stenosis. No significant regurgitation.   Left atrium: The atrium was normal in size.   Right ventricle: Right heart structures poorly visualized. However   on parasternal images suggestion of normal RV size and function.   Pulmonic valve: Poorly visualized.   Tricuspid valve: Poorly visualized.   Right atrium: Poorly visualized.   Pericardium: On parasternal images, no pericardial effusion noted.   Systemic veins:   Inferior vena cava: Not well visualized.   Post procedure conclusions   Ascending Aorta:    - The aorta was poorly visualized.   Prepared and Electronically Authenticated by   EKG:  EKG is ordered today.  The ekg ordered today demonstrates NSR 98bpm, nonspecific T wave changes  Recent Labs: 12/14/2021: ALT 18  Recent Lipid Panel    Component Value Date/Time   CHOL 155 06/15/2021 0907   TRIG 55 06/15/2021 0907   HDL 45 06/15/2021 0907   CHOLHDL 3.4 06/15/2021 0907   LDLCALC 99 06/15/2021 0907    Physical Exam:    VS:  BP (!) 141/91 (BP Location: Left Arm, Patient Position: Sitting, Cuff Size: Large) Comment (BP Location): forearm  Pulse 98   Ht 5' (1.524 m)   Wt (!) 346 lb (156.9 kg)   SpO2 99%   BMI 67.57 kg/m     Wt Readings from Last 3 Encounters:  06/24/22 (!) 346 lb (156.9 kg)  06/10/22 (!) 344 lb 1.6 oz (156.1 kg)  05/24/22 (!) 331 lb (150.1 kg)     GEN:  Well nourished, well developed in no acute distress HEENT: Normal NECK: No JVD; No carotid bruits LYMPHATICS: No lymphadenopathy CARDIAC: RRR, no murmurs, rubs, gallops RESPIRATORY:  Clear to auscultation without rales, wheezing or rhonchi  ABDOMEN: Soft, non-tender,  non-distended MUSCULOSKELETAL:  No edema; No deformity  SKIN: Warm and dry NEUROLOGIC:  Alert and oriented x 3 PSYCHIATRIC:  Normal affect   ASSESSMENT:    1. Essential hypertension   2. Leg edema   3. Chronic diastolic CHF (congestive heart failure) ([place])   4. OSA (obstructive sleep apnea)    PLAN:    In order of problems listed above:  HTN BP has been high for a couple weeks. BP today 141/91. The patient reports headache at times with her high BP. PCP started Coreg 3.125mg  BID, which the patient had not started. I recommend she start Coreg and we re-evaluate BP  at follow-up.   Leg edema Chronic diastolic CHF Stable with Torsemide 40mg  daily, we will refill Torsemide today.  OSA She is interested in looking into repeating a sleep study.   Disposition: Follow up in 1 month(s) with MD/APP   Signed, Amber Williard Ninfa Meeker, [name]  06/24/2022 4:35 PM    [facility]

## 2022-06-24 NOTE — Patient Instructions (Signed)
Medication Instructions:  Your Physician recommend you continue on your current medication as directed.    *If you need a refill on your cardiac medications before your next appointment, please call your pharmacy*   Lab Work: None ordered today    Testing/Procedures: None ordered today   Follow-Up: At [facility], you and your health needs are our priority.  As part of our continuing mission to provide you with exceptional heart care, we have created designated Provider Care Teams.  These Care Teams include your primary Cardiologist (physician) and Advanced Practice Providers (APPs -  Physician Assistants and Nurse Practitioners) who all work together to provide you with the care you need, when you need it.  We recommend signing up for the patient portal called "MyChart".  Sign up information is provided on this After Visit Summary.  MyChart is used to connect with patients for Virtual Visits (Telemedicine).  Patients are able to view lab/test results, encounter notes, upcoming appointments, etc.  Non-urgent messages can be sent to your provider as well.   To learn more about what you can do with MyChart, go to [email or web address].    Your next appointment:   1 month(s)  Provider:   You may see Ida Rogue, MD or one of the following Advanced Practice Providers on your designated Care Team:   Murray Hodgkins, NP Christell Faith, [name] Cadence Kathlen Mody, [name] Gerrie Nordmann, NP

## 2022-06-30 NOTE — Addendum Note (Signed)
Addended by: James Ivanoff D on: 06/30/2022 12:39 PM   Modules accepted: Orders

## 2022-07-08 ENCOUNTER — Ambulatory Visit: Payer: Managed Care, Other (non HMO) | Admitting: Family Medicine

## 2022-07-08 NOTE — Progress Notes (Deleted)
Established patient visit   Patient: [name]   DOB: [birth year]   37 y.o. Female  MRN: [id number] Visit Date: 07/08/2022  Today's healthcare provider: Gwyneth Sprout, FNP   No chief complaint on file.  Subjective    HPI  Hypertension, follow-up  BP Readings from Last 3 Encounters:  06/24/22 (!) 141/91  06/10/22 (!) 142/92  12/20/21 138/85   Wt Readings from Last 3 Encounters:  06/24/22 (!) 346 lb (156.9 kg)  06/10/22 (!) 344 lb 1.6 oz (156.1 kg)  05/24/22 (!) 331 lb (150.1 kg)     She was last seen for hypertension 1 months ago.  BP at that visit was 142/92. Management since that visit includes carvedilol (COREG) 3.125 MG tablet .  She reports {excellent/good/fair/poor:19665} compliance with treatment. She {is/is not:9024} having side effects. {document side effects if present:1} She is following a {diet:21022986} diet. She {is/is not:9024} exercising. She {does/does not:200015} smoke.  Use of agents associated with hypertension: {bp agents assoc with hypertension:511::"none"}.   Outside blood pressures are {***enter patient reported home BP readings, or 'not being checked':1}. Symptoms: {Yes/No:20286} chest pain {Yes/No:20286} chest pressure  {Yes/No:20286} palpitations {Yes/No:20286} syncope  {Yes/No:20286} dyspnea {Yes/No:20286} orthopnea  {Yes/No:20286} paroxysmal nocturnal dyspnea {Yes/No:20286} lower extremity edema   Pertinent labs Lab Results  Component Value Date   CHOL 155 06/15/2021   HDL 45 06/15/2021   LDLCALC 99 06/15/2021   TRIG 55 06/15/2021   CHOLHDL 3.4 06/15/2021   Lab Results  Component Value Date   NA 143 06/15/2021   K 4.6 06/15/2021   CREATININE 0.85 06/15/2021   EGFR 91 06/15/2021   GLUCOSE 98 06/15/2021   TSH 2.800 12/21/2020     The ASCVD Risk score (Arnett DK, et al., 2019) failed to calculate for the following reasons:   The 2019 ASCVD risk score is only valid for ages 109 to  5  ---------------------------------------------------------------------------------------------------   Medications: Outpatient Medications Prior to Visit  Medication Sig   albuterol (VENTOLIN HFA) 108 (90 Base) MCG/ACT inhaler Inhale 2 puffs into the lungs every 6 (six) hours as needed for wheezing or shortness of breath.   benzonatate (TESSALON) 100 MG capsule Take 1 capsule (100 mg total) by mouth 3 (three) times daily as needed for cough.   buPROPion (WELLBUTRIN XL) 150 MG 24 hr tablet Take 1 tablet (150 mg total) by mouth daily.   carvedilol (COREG) 3.125 MG tablet Take 1 tablet (3.125 mg total) by mouth 2 (two) times daily with a meal.   chlorpheniramine-HYDROcodone (TUSSIONEX) 10-8 MG/5ML Take 5 mLs by mouth every 12 (twelve) hours as needed for cough.   ciclopirox (PENLAC) 8 % solution Apply topically at bedtime. Apply over nail and surrounding skin. Apply daily over previous coat. After seven (7) days, may remove with alcohol and continue cycle.   cyclobenzaprine (FLEXERIL) 10 MG tablet Take 1 tablet (10 mg total) by mouth 3 (three) times daily as needed for muscle spasms.   doxycycline (VIBRA-TABS) 100 MG tablet Take 1 tablet (100 mg total) by mouth 2 (two) times daily. (Patient not taking: Reported on 06/10/2022)   FLUoxetine (PROZAC) 20 MG tablet Take 1 tablet (20 mg total) by mouth daily.   fluticasone-salmeterol (ADVAIR DISKUS) 250-50 MCG/ACT AEPB Inhale 1 puff into the lungs 2 (two) times daily.   Fluticasone-Umeclidin-Vilant (TRELEGY ELLIPTA) 200-62.5-25 MCG/ACT AEPB Inhale 1 puff into the lungs daily.   medroxyPROGESTERone (DEPO-PROVERA) 150 MG/ML injection Inject 150 mg into the muscle every 3 (three) months.  meloxicam (MOBIC) 15 MG tablet Take 1 tablet (15 mg total) by mouth daily.   metFORMIN (GLUCOPHAGE XR) 500 MG 24 hr tablet Take 2 tablets (1,000 mg total) by mouth 2 (two) times daily with breakfast and lunch.   metroNIDAZOLE (METROGEL) 0.75 % vaginal gel Place 1  Applicatorful vaginally 2 (two) times daily as needed.   montelukast (SINGULAIR) 10 MG tablet Take 1 tablet (10 mg total) by mouth at bedtime as needed.   nystatin cream (MYCOSTATIN) Apply 1 application topically 2 (two) times daily as needed for dry skin.   omeprazole (PRILOSEC) 40 MG capsule Take 1 capsule (40 mg total) daily by mouth.   ondansetron (ZOFRAN ODT) 4 MG disintegrating tablet Take 1 tablet (4 mg total) by mouth every 8 (eight) hours as needed for nausea or vomiting.   Pancrelipase, Lip-Prot-Amyl, (ZENPEP) 40000-126000 units CPEP Take 1 capsule by mouth 3 (three) times daily as needed.   pantoprazole (PROTONIX) 40 MG tablet Take 1 tablet (40 mg total) by mouth 2 (two) times daily before a meal.   phentermine 37.5 MG capsule Take 1 capsule (37.5 mg total) by mouth every morning.   potassium chloride (KLOR-CON) 10 MEQ tablet Take 1 tablet (10 mEq total) by mouth daily.   predniSONE (DELTASONE) 10 MG tablet Day 1 & 2 take 6 tablets Day 3 &4 take 5 tablets Day 5 &6 take 4 tablets Day 7 & 8 take 3 tablets Day 9 & 10 take 2 tablets Day 11 & 12 take 1 tablet Day 13 & 14 take 1/2 tablet   predniSONE (DELTASONE) 20 MG tablet Take 2 tablets (40 mg total) by mouth daily with breakfast. (Patient not taking: Reported on 06/24/2022)   propranolol (INDERAL) 20 MG tablet Take 1 tablet (20 mg total) by mouth 3 (three) times daily as needed. For palpitations   simethicone (MYLICON) 80 MG chewable tablet Chew 1 tablet (80 mg total) by mouth every 6 (six) hours as needed for flatulence.   tiZANidine (ZANAFLEX) 4 MG tablet 1/2-1 q HS prn   torsemide (DEMADEX) 20 MG tablet Take 2 tablets (40 mg total) by mouth daily.   Vitamin D, Ergocalciferol, (DRISDOL) 1.25 MG (50000 UNIT) CAPS capsule Take 1 capsule (50,000 Units total) by mouth every 7 (seven) days.   Facility-Administered Medications Prior to Visit  Medication Dose Route Frequency Provider   bupivacaine (MARCAINE) 0.5 % (with pres) injection 3 mL  3  mL Other Once Magnus Sinning, MD    Review of Systems  {Labs  Heme  Chem  Endocrine  Serology  Results Review (optional):23779}   Objective    There were no vitals taken for this visit. {Show previous vital signs (optional):23777}  Physical Exam  ***  No results found for any visits on 07/08/22.  Assessment & Plan     ***  No follow-ups on file.      {provider attestation***:1}   Gwyneth Sprout, [place] [phone] (phone) [phone] (fax)  [place]

## 2022-07-14 NOTE — Progress Notes (Signed)
I,Connie R Striblin,acting as a Education administrator for Gwyneth Sprout, FNP.,have documented all relevant documentation on the behalf of Gwyneth Sprout, FNP,as directed by  Gwyneth Sprout, FNP while in the presence of Gwyneth Sprout, FNP.  Established patient visit  Patient: [name]   DOB: 11/13/1985   37 y.o. Female  MRN: [id number] Visit Date: 07/15/2022  Today's healthcare provider: Gwyneth Sprout, FNP  Introduced to nurse practitioner role and practice setting.  All questions answered.  Discussed provider/patient relationship and expectations.  Chief Complaint  Patient presents with   Follow-up   Subjective    HPI  Hypertension, follow-up  BP Readings from Last 3 Encounters:  07/15/22 120/78  06/24/22 (!) 141/91  06/10/22 (!) 142/92   Wt Readings from Last 3 Encounters:  07/15/22 (!) 337 lb 6.4 oz (153 kg)  06/24/22 (!) 346 lb (156.9 kg)  06/10/22 (!) 344 lb 1.6 oz (156.1 kg)     She was last seen for hypertension 1 months ago.  BP at that visit was 142/92. Management since that visit includes: Encouraged to f/u with cardiology given elevated HR and BP- Coreg 3.'125mg'$   Cardiology apt was 06/24/22  She reports excellent compliance with treatment. She is not having side effects.  She is following a Regular diet- reducing salt intake She is not exercising. Use of agents associated with hypertension: none.   Outside blood pressures are within normal range   Pertinent labs Lab Results  Component Value Date   CHOL 155 06/15/2021   HDL 45 06/15/2021   LDLCALC 99 06/15/2021   TRIG 55 06/15/2021   CHOLHDL 3.4 06/15/2021   Lab Results  Component Value Date   NA 143 06/15/2021   K 4.6 06/15/2021   CREATININE 0.85 06/15/2021   EGFR 91 06/15/2021   GLUCOSE 98 06/15/2021   TSH 2.800 12/21/2020     The ASCVD Risk score (Arnett DK, et al., 2019) failed to calculate for the following reasons:   The 2019 ASCVD risk score is only valid for ages 69 to  24  ---------------------------------------------------------------------------------------------------  Medications: Outpatient Medications Prior to Visit  Medication Sig   albuterol (VENTOLIN HFA) 108 (90 Base) MCG/ACT inhaler Inhale 2 puffs into the lungs every 6 (six) hours as needed for wheezing or shortness of breath.   buPROPion (WELLBUTRIN XL) 150 MG 24 hr tablet Take 1 tablet (150 mg total) by mouth daily.   carvedilol (COREG) 3.125 MG tablet Take 1 tablet (3.125 mg total) by mouth 2 (two) times daily with a meal.   chlorpheniramine-HYDROcodone (TUSSIONEX) 10-8 MG/5ML Take 5 mLs by mouth every 12 (twelve) hours as needed for cough.   ciclopirox (PENLAC) 8 % solution Apply topically at bedtime. Apply over nail and surrounding skin. Apply daily over previous coat. After seven (7) days, may remove with alcohol and continue cycle.   cyclobenzaprine (FLEXERIL) 10 MG tablet Take 1 tablet (10 mg total) by mouth 3 (three) times daily as needed for muscle spasms.   FLUoxetine (PROZAC) 20 MG tablet Take 1 tablet (20 mg total) by mouth daily.   Fluticasone-Umeclidin-Vilant (TRELEGY ELLIPTA) 200-62.5-25 MCG/ACT AEPB Inhale 1 puff into the lungs daily.   medroxyPROGESTERone (DEPO-PROVERA) 150 MG/ML injection Inject 150 mg into the muscle every 3 (three) months.   meloxicam (MOBIC) 15 MG tablet Take 1 tablet (15 mg total) by mouth daily.   metFORMIN (GLUCOPHAGE XR) 500 MG 24 hr tablet Take 2 tablets (1,000 mg total) by mouth 2 (two) times daily  with breakfast and lunch.   metroNIDAZOLE (METROGEL) 0.75 % vaginal gel Place 1 Applicatorful vaginally 2 (two) times daily as needed.   montelukast (SINGULAIR) 10 MG tablet Take 1 tablet (10 mg total) by mouth at bedtime as needed.   nystatin cream (MYCOSTATIN) Apply 1 application topically 2 (two) times daily as needed for dry skin.   omeprazole (PRILOSEC) 40 MG capsule Take 1 capsule (40 mg total) daily by mouth.   ondansetron (ZOFRAN ODT) 4 MG  disintegrating tablet Take 1 tablet (4 mg total) by mouth every 8 (eight) hours as needed for nausea or vomiting.   Pancrelipase, Lip-Prot-Amyl, (ZENPEP) 40000-126000 units CPEP Take 1 capsule by mouth 3 (three) times daily as needed.   pantoprazole (PROTONIX) 40 MG tablet Take 1 tablet (40 mg total) by mouth 2 (two) times daily before a meal.   phentermine 37.5 MG capsule Take 1 capsule (37.5 mg total) by mouth every morning.   potassium chloride (KLOR-CON) 10 MEQ tablet Take 1 tablet (10 mEq total) by mouth daily.   propranolol (INDERAL) 20 MG tablet Take 1 tablet (20 mg total) by mouth 3 (three) times daily as needed. For palpitations   simethicone (MYLICON) 80 MG chewable tablet Chew 1 tablet (80 mg total) by mouth every 6 (six) hours as needed for flatulence.   tiZANidine (ZANAFLEX) 4 MG tablet 1/2-1 q HS prn   torsemide (DEMADEX) 20 MG tablet Take 2 tablets (40 mg total) by mouth daily.   Vitamin D, Ergocalciferol, (DRISDOL) 1.25 MG (50000 UNIT) CAPS capsule Take 1 capsule (50,000 Units total) by mouth every 7 (seven) days.   fluticasone-salmeterol (ADVAIR DISKUS) 250-50 MCG/ACT AEPB Inhale 1 puff into the lungs 2 (two) times daily.   [DISCONTINUED] benzonatate (TESSALON) 100 MG capsule Take 1 capsule (100 mg total) by mouth 3 (three) times daily as needed for cough.   [DISCONTINUED] doxycycline (VIBRA-TABS) 100 MG tablet Take 1 tablet (100 mg total) by mouth 2 (two) times daily. (Patient not taking: Reported on 06/10/2022)   [DISCONTINUED] predniSONE (DELTASONE) 10 MG tablet Day 1 & 2 take 6 tablets Day 3 &4 take 5 tablets Day 5 &6 take 4 tablets Day 7 & 8 take 3 tablets Day 9 & 10 take 2 tablets Day 11 & 12 take 1 tablet Day 13 & 14 take 1/2 tablet   [DISCONTINUED] predniSONE (DELTASONE) 20 MG tablet Take 2 tablets (40 mg total) by mouth daily with breakfast. (Patient not taking: Reported on 06/24/2022)   Facility-Administered Medications Prior to Visit  Medication Dose Route Frequency Provider    bupivacaine (MARCAINE) 0.5 % (with pres) injection 3 mL  3 mL Other Once Magnus Sinning, MD    Review of Systems     Objective    BP 120/78 (BP Location: Left Wrist, Patient Position: Sitting, Cuff Size: Large)   Pulse (!) 104   Temp 98.8 F (37.1 C) (Oral)   Wt (!) 337 lb 6.4 oz (153 kg)   SpO2 100%   BMI 65.89 kg/m    Physical Exam Vitals and nursing note reviewed.  Constitutional:      General: She is not in acute distress.    Appearance: Normal appearance. She is obese. She is not ill-appearing, toxic-appearing or diaphoretic.  HENT:     Head: Normocephalic and atraumatic.  Cardiovascular:     Rate and Rhythm: Normal rate and regular rhythm.     Pulses: Normal pulses.     Heart sounds: Heart sounds are distant. No murmur heard.  No friction rub. No gallop.  Pulmonary:     Effort: Pulmonary effort is normal. No respiratory distress.     Breath sounds: Normal breath sounds. No stridor. No wheezing, rhonchi or rales.  Chest:     Chest wall: No tenderness.  Musculoskeletal:        General: No swelling, tenderness, deformity or signs of injury. Normal range of motion.     Right lower leg: No edema.     Left lower leg: No edema.  Skin:    General: Skin is warm and dry.     Capillary Refill: Capillary refill takes less than 2 seconds.     Coloration: Skin is not jaundiced or pale.     Findings: No bruising, erythema, lesion or rash.  Neurological:     General: No focal deficit present.     Mental Status: She is alert and oriented to person, place, and time. Mental status is at baseline.     Cranial Nerves: No cranial nerve deficit.     Sensory: No sensory deficit.     Motor: No weakness.     Coordination: Coordination normal.  Psychiatric:        Mood and Affect: Mood normal.        Behavior: Behavior normal.        Thought Content: Thought content normal.        Judgment: Judgment normal.     No results found for any visits on 07/15/22.  Assessment &  Plan     Problem List Items Addressed This Visit       Cardiovascular and Mediastinum   Chronic diastolic CHF (congestive heart failure) ([place])    Patient notes improved breathing and edema in BLE with consistent use of diuretic. Notes every other to every 3rd day. Has closed referral to pulmonary as likely fluid related cough has subsided; has seen cardiology who confirmed safe use with coreg and diuretic.       Relevant Orders   Comprehensive Metabolic Panel (CMET)   CBC with Differential/Platelet   TSH + free T4   Hemoglobin A1c   Lipid panel     Endocrine   History of prediabetes    Repeat A1c Continue to recommend balanced, lower carb meals. Smaller meal size, adding snacks. Choosing water as drink of choice and increasing purposeful exercise. No complaints at this time       Relevant Orders   Hemoglobin A1c     Other   Morbid obesity (HCC) - Primary    Chronic, improving Continue to emphasis heart healthy diet and exercise to assist Body mass index is 65.89 kg/m.       Relevant Orders   Comprehensive Metabolic Panel (CMET)   CBC with Differential/Platelet   TSH + free T4   Hemoglobin A1c   Lipid panel   Return in about 3 months (around 10/13/2022) for chonic disease management.     Vonna Kotyk, FNP, have reviewed all documentation for this visit. The documentation on 07/15/22 for the exam, diagnosis, procedures, and orders are all accurate and complete.  Gwyneth Sprout, [place] [phone] (phone) [phone] (fax)  [place]

## 2022-07-15 ENCOUNTER — Ambulatory Visit: Payer: Managed Care, Other (non HMO) | Admitting: Family Medicine

## 2022-07-15 ENCOUNTER — Encounter: Payer: Self-pay | Admitting: Family Medicine

## 2022-07-15 DIAGNOSIS — Z87898 Personal history of other specified conditions: Secondary | ICD-10-CM | POA: Insufficient documentation

## 2022-07-15 DIAGNOSIS — I5032 Chronic diastolic (congestive) heart failure: Secondary | ICD-10-CM | POA: Diagnosis not present

## 2022-07-15 NOTE — Assessment & Plan Note (Signed)
Repeat A1c Continue to recommend balanced, lower carb meals. Smaller meal size, adding snacks. Choosing water as drink of choice and increasing purposeful exercise. No complaints at this time

## 2022-07-15 NOTE — Assessment & Plan Note (Signed)
Patient notes improved breathing and edema in BLE with consistent use of diuretic. Notes every other to every 3rd day. Has closed referral to pulmonary as likely fluid related cough has subsided; has seen cardiology who confirmed safe use with coreg and diuretic.

## 2022-07-15 NOTE — Assessment & Plan Note (Signed)
Chronic, improving Continue to emphasis heart healthy diet and exercise to assist Body mass index is 65.89 kg/m.

## 2022-07-16 LAB — CBC WITH DIFFERENTIAL/PLATELET
Basophils Absolute: 0.1 10*3/uL (ref 0.0–0.2)
Basos: 1 %
EOS (ABSOLUTE): 0.1 10*3/uL (ref 0.0–0.4)
Eos: 1 %
Hematocrit: 38.4 % (ref 34.0–46.6)
Hemoglobin: 12.6 g/dL (ref 11.1–15.9)
Immature Grans (Abs): 0 10*3/uL (ref 0.0–0.1)
Immature Granulocytes: 0 %
Lymphocytes Absolute: 2.6 10*3/uL (ref 0.7–3.1)
Lymphs: 43 %
MCH: 30 pg (ref 26.6–33.0)
MCHC: 32.8 g/dL (ref 31.5–35.7)
MCV: 91 fL (ref 79–97)
Monocytes Absolute: 0.4 10*3/uL (ref 0.1–0.9)
Monocytes: 7 %
Neutrophils Absolute: 2.9 10*3/uL (ref 1.4–7.0)
Neutrophils: 48 %
Platelets: 302 10*3/uL (ref 150–450)
RBC: 4.2 x10E6/uL (ref 3.77–5.28)
RDW: 13.9 % (ref 11.7–15.4)
WBC: 6.1 10*3/uL (ref 3.4–10.8)

## 2022-07-16 LAB — COMPREHENSIVE METABOLIC PANEL
ALT: 13 IU/L (ref 0–32)
AST: 17 IU/L (ref 0–40)
Albumin/Globulin Ratio: 1.4 (ref 1.2–2.2)
Albumin: 4.1 g/dL (ref 3.9–4.9)
Alkaline Phosphatase: 91 IU/L (ref 44–121)
BUN/Creatinine Ratio: 13 (ref 9–23)
BUN: 10 mg/dL (ref 6–20)
Bilirubin Total: 0.2 mg/dL (ref 0.0–1.2)
CO2: 21 mmol/L (ref 20–29)
Calcium: 9.6 mg/dL (ref 8.7–10.2)
Chloride: 103 mmol/L (ref 96–106)
Creatinine, Ser: 0.78 mg/dL (ref 0.57–1.00)
Globulin, Total: 3 g/dL (ref 1.5–4.5)
Glucose: 92 mg/dL (ref 70–99)
Potassium: 4.1 mmol/L (ref 3.5–5.2)
Sodium: 141 mmol/L (ref 134–144)
Total Protein: 7.1 g/dL (ref 6.0–8.5)
eGFR: 100 mL/min/{1.73_m2} (ref >59)

## 2022-07-16 LAB — LIPID PANEL
Chol/HDL Ratio: 3.7 ratio (ref 0.0–4.4)
Cholesterol, Total: 161 mg/dL (ref 100–199)
HDL: 44 mg/dL
LDL Chol Calc (NIH): 104 mg/dL — ABNORMAL HIGH (ref 0–99)
Triglycerides: 64 mg/dL (ref 0–149)
VLDL Cholesterol Cal: 13 mg/dL (ref 5–40)

## 2022-07-16 LAB — HEMOGLOBIN A1C
Est. average glucose Bld gHb Est-mCnc: 108 mg/dL
Hgb A1c MFr Bld: 5.4 % (ref 4.8–5.6)

## 2022-07-16 LAB — TSH+FREE T4
Free T4: 1.43 ng/dL (ref 0.82–1.77)
TSH: 1.76 u[IU]/mL (ref 0.450–4.500)

## 2022-07-16 NOTE — Progress Notes (Signed)
Cholesterol is slightly increase. I continue to recommend diet low in saturated fat and regular exercise - 30 min at least 5 times per week  Blood chemistry shows normal and stable electrolytes, as well as kidney and liver function. All other labs are normal and stable.  Gwyneth Sprout, [address] [place], [place] [address] [phone] (phone) [phone] (fax) [place]

## 2022-07-18 ENCOUNTER — Telehealth: Payer: Self-pay | Admitting: *Deleted

## 2022-07-18 NOTE — Telephone Encounter (Signed)
Patient called for test results and notified:  Cholesterol is slightly increase. I continue to recommend diet low in saturated fat and regular exercise - 30 min at least 5 times per week   Blood chemistry shows normal and stable electrolytes, as well as kidney and liver function. All other labs are normal and stable.

## 2022-07-25 NOTE — Progress Notes (Unsigned)
Evaluation Performed:  Follow-up visit  Date:  07/26/2022   ID:  [name], DOB [birth year], MRN [id number]  Patient Location:  [address] [place] [address]   Provider location:   [facility], [place] office  PCP:  Gwyneth Sprout, FNP  Cardiologist:  Arvid Right [facility]  Chief Complaint  Patient presents with   1 month follow up     Patient c/o having a pain in the back of her head on Monday, 07/26/2022 with right arm tingling since. Medications reviewed by the patient verbally.     History of Present Illness:    [name] is a 37 y.o. female  past medical history of Smoker, Reactive airway disease/asthma Morbid obesity  Anxiety OSA, noncompliant with CPAP Chronic diastolic CHF Chronic leg swelling Who presents for follow-up of her fluid retention/leg swelling  Last seen in clinic July 2023  Getting over "sickness", viral Lots of coughing, has improved  Continues on torsemide 40 QOD, sometimes every 3 days No significant foot ankle swelling Sometimes has swelling in her thighs, denies abdominal distention  Drinks water, 4x 16 oz  Blood pressure well-controlled Wants to start regular exercise program, has changed her diet, low-carb   Weight down 11 pounds from prior clinic visit Works at [employer] reviewed: A1C 5.6 CR 0.85  Breathing stable COVID February 2022  Prior hx of palpitations PAC and PVC before on monitor Not taking propranolol as symptoms are reasonable  EKG personally reviewed by myself on todays visit Shows normal sinus rhythm rate 97 bpm no significant ST-T wave changes  Other past medical history reviewed Previous stress echo September 2017  Dr. Humphrey Rolls January 19, 2016 showing normal stress test breast attenuation artifact  CT scan chest was performed showing calcium score of 0 normal coronary arteries  Echocardiogram January 26, 2016 showing normal LV function normal right heart pressures  mild TR mildly dilated left atrium Diastolic parameters normal  Previous Holter done through [facility] showing normal sinus rhythm APCs PVCs and   emergency room [facility] 11/13/2017, shortness of breath Chest x-ray was unremarkable Lab work normal, D-dimer negative  Lab work reviewed Creatinine 0.76 BUN 12 Hemoglobin A1c 4.9 LDL 78   Past Medical History:  Diagnosis Date   Anxiety    Depression    GERD (gastroesophageal reflux disease)    Hypertension    Sleep apnea    Past Surgical History:  Procedure Laterality Date   CESAREAN SECTION     at 28 weeks for pre-eclampsia, pulmonary edema, gestational DM.   dilationand curettage of Uterus  05/23/2005   SAB     Allergies:   Lactose intolerance (gi), Penicillins, and Pineapple   Social History   Tobacco Use   Smoking status: Some Days    Types: Cigars   Smokeless tobacco: Never   Tobacco comments:    smokes cigars on occ  Vaping Use   Vaping Use: Never used  Substance Use Topics   Alcohol use: Yes    Alcohol/week: 0.0 standard drinks of alcohol    Comment: ocassional alcohol use; Mixed drinks once every 2-3 months   Drug use: No     Current Outpatient Medications on File Prior to Visit  Medication Sig Dispense Refill   albuterol (VENTOLIN HFA) 108 (90 Base) MCG/ACT inhaler Inhale 2 puffs into the lungs every 6 (six) hours as needed for wheezing or shortness of breath. 8 g 0   buPROPion (WELLBUTRIN XL) 150  MG 24 hr tablet Take 1 tablet (150 mg total) by mouth daily. 90 tablet 0   carvedilol (COREG) 3.125 MG tablet Take 1 tablet (3.125 mg total) by mouth 2 (two) times daily with a meal. 60 tablet 1   chlorpheniramine-HYDROcodone (TUSSIONEX) 10-8 MG/5ML Take 5 mLs by mouth every 12 (twelve) hours as needed for cough. 115 mL 0   ciclopirox (PENLAC) 8 % solution Apply topically at bedtime. Apply over nail and surrounding skin. Apply daily over previous coat. After seven (7) days, may remove with alcohol and continue  cycle. 6.6 mL 0   cyclobenzaprine (FLEXERIL) 10 MG tablet Take 1 tablet (10 mg total) by mouth 3 (three) times daily as needed for muscle spasms. 90 tablet 1   FLUoxetine (PROZAC) 20 MG tablet Take 1 tablet (20 mg total) by mouth daily. 30 tablet 1   fluticasone-salmeterol (ADVAIR DISKUS) 250-50 MCG/ACT AEPB Inhale 1 puff into the lungs 2 (two) times daily. 60 each 0   Fluticasone-Umeclidin-Vilant (TRELEGY ELLIPTA) 200-62.5-25 MCG/ACT AEPB Inhale 1 puff into the lungs daily. 28 each 12   medroxyPROGESTERone (DEPO-PROVERA) 150 MG/ML injection Inject 150 mg into the muscle every 3 (three) months.     meloxicam (MOBIC) 15 MG tablet Take 1 tablet (15 mg total) by mouth daily. 90 tablet 1   metFORMIN (GLUCOPHAGE XR) 500 MG 24 hr tablet Take 2 tablets (1,000 mg total) by mouth 2 (two) times daily with breakfast and lunch. 360 tablet 0   metroNIDAZOLE (METROGEL) 0.75 % vaginal gel Place 1 Applicatorful vaginally 2 (two) times daily as needed.     montelukast (SINGULAIR) 10 MG tablet Take 1 tablet (10 mg total) by mouth at bedtime as needed. 30 tablet 0   nystatin cream (MYCOSTATIN) Apply 1 application topically 2 (two) times daily as needed for dry skin.     omeprazole (PRILOSEC) 40 MG capsule Take 1 capsule (40 mg total) daily by mouth. 30 capsule 3   ondansetron (ZOFRAN ODT) 4 MG disintegrating tablet Take 1 tablet (4 mg total) by mouth every 8 (eight) hours as needed for nausea or vomiting. 20 tablet 0   Pancrelipase, Lip-Prot-Amyl, (ZENPEP) 40000-126000 units CPEP Take 1 capsule by mouth 3 (three) times daily as needed. 84 capsule 0   pantoprazole (PROTONIX) 40 MG tablet Take 1 tablet (40 mg total) by mouth 2 (two) times daily before a meal. 180 tablet 3   phentermine 37.5 MG capsule Take 1 capsule (37.5 mg total) by mouth every morning. 90 capsule 0   potassium chloride (KLOR-CON) 10 MEQ tablet Take 1 tablet (10 mEq total) by mouth daily. 90 tablet 3   propranolol (INDERAL) 20 MG tablet Take 1 tablet  (20 mg total) by mouth 3 (three) times daily as needed. For palpitations 90 tablet 5   simethicone (MYLICON) 80 MG chewable tablet Chew 1 tablet (80 mg total) by mouth every 6 (six) hours as needed for flatulence. 30 tablet 0   tiZANidine (ZANAFLEX) 4 MG tablet 1/2-1 q HS prn     torsemide (DEMADEX) 20 MG tablet Take 2 tablets (40 mg total) by mouth daily. 180 tablet 3   Vitamin D, Ergocalciferol, (DRISDOL) 1.25 MG (50000 UNIT) CAPS capsule Take 1 capsule (50,000 Units total) by mouth every 7 (seven) days. 13 capsule 3   Current Facility-Administered Medications on File Prior to Visit  Medication Dose Route Frequency Provider Last Rate Last Admin   bupivacaine (MARCAINE) 0.5 % (with pres) injection 3 mL  3 mL Other Once Magnus Sinning,  MD         Family Hx: The patient's family history includes Hypertension in her maternal grandmother.  ROS:   Please see the history of present illness.    Review of Systems  Constitutional: Negative.   HENT: Negative.    Respiratory: Negative.    Cardiovascular: Negative.   Gastrointestinal: Negative.   Musculoskeletal: Negative.   Neurological: Negative.   Psychiatric/Behavioral: Negative.    All other systems reviewed and are negative.   Labs/Other Tests and Data Reviewed:    Recent Labs: 07/15/2022: ALT 13; BUN 10; Creatinine, Ser 0.78; Hemoglobin 12.6; Platelets 302; Potassium 4.1; Sodium 141; TSH 1.760   Recent Lipid Panel Lab Results  Component Value Date/Time   CHOL 161 07/15/2022 10:07 AM   TRIG 64 07/15/2022 10:07 AM   HDL 44 07/15/2022 10:07 AM   CHOLHDL 3.7 07/15/2022 10:07 AM   LDLCALC 104 (H) 07/15/2022 10:07 AM    Wt Readings from Last 3 Encounters:  07/26/22 (!) 337 lb 8 oz (153.1 kg)  07/15/22 (!) 337 lb 6.4 oz (153 kg)  06/24/22 (!) 346 lb (156.9 kg)     Exam:    BP 128/85 (BP Location: Left Wrist, Patient Position: Sitting, Cuff Size: Normal)   Pulse 97   Ht '5\' 1"'$  (1.549 m)   Wt (!) 337 lb 8 oz (153.1 kg)   SpO2  97%   BMI 63.77 kg/m  Constitutional:  oriented to person, place, and time. No distress.  HENT:  Head: Grossly normal Eyes:  no discharge. No scleral icterus.  Neck: No JVD, no carotid bruits  Cardiovascular: Regular rate and rhythm, no murmurs appreciated Pulmonary/Chest: Clear to auscultation bilaterally, no wheezes or rails Abdominal: Soft.  no distension.  no tenderness.  Musculoskeletal: Normal range of motion Neurological:  normal muscle tone. Coordination normal. No atrophy Skin: Skin warm and dry Psychiatric: normal affect, pleasant   ASSESSMENT & PLAN:    Problem List Items Addressed This Visit       Cardiology Problems   Chronic diastolic CHF (congestive heart failure) ([place]) - Primary   Relevant Orders   EKG 12-Lead     Other   Morbid obesity ([place])   Other Visit Diagnoses     OSA (obstructive sleep apnea)       Relevant Orders   EKG 12-Lead   Essential hypertension       Relevant Orders   EKG 12-Lead   Leg edema       Palpitations       Relevant Orders   EKG 12-Lead     Chronic diastolic CHF (congestive heart failure) ([place]) Exacerbated by morbid obesity Relatively euvolemic Recommend she continue torsemide 40 QOD,  Echocardiogram previously offered  Morbid obesity ([place])  weight trending downward Periodically on phentermine in the past Recommend she restart her gym membership, exercise program Difficulty getting qualified for other weight loss medications  Palpitations - Previous Holter monitor 2017 showing APCs and PVCs Prior event monitor: Patient triggered events associated with PACs, sometimes with atrial tachycardia  Propranolol as needed   Leg edema Exacerbated by morbid obesity, sleep apnea, high fluid intake Stable Continue torsemide 40 mg QOD, weight down 11 pounds  OSA Not on CPAP   Total encounter time more than 30 minutes  Greater than 50% was spent in counseling and coordination of care with the patient   Signed, Ida Rogue, [address], [place], [place] [address]

## 2022-07-26 ENCOUNTER — Encounter: Payer: Self-pay | Admitting: Cardiovascular Disease

## 2022-07-26 ENCOUNTER — Ambulatory Visit: Payer: Managed Care, Other (non HMO) | Attending: Cardiovascular Disease | Admitting: Cardiovascular Disease

## 2022-07-26 VITALS — BP 128/85 | HR 97 | Ht 61.0 in | Wt 337.5 lb

## 2022-07-26 DIAGNOSIS — I5032 Chronic diastolic (congestive) heart failure: Secondary | ICD-10-CM | POA: Diagnosis not present

## 2022-07-26 DIAGNOSIS — G4733 Obstructive sleep apnea (adult) (pediatric): Secondary | ICD-10-CM

## 2022-07-26 DIAGNOSIS — R002 Palpitations: Secondary | ICD-10-CM | POA: Diagnosis not present

## 2022-07-26 DIAGNOSIS — I10 Essential (primary) hypertension: Secondary | ICD-10-CM

## 2022-07-26 DIAGNOSIS — E876 Hypokalemia: Secondary | ICD-10-CM

## 2022-07-26 DIAGNOSIS — R600 Localized edema: Secondary | ICD-10-CM | POA: Diagnosis not present

## 2022-07-26 MED ORDER — POTASSIUM CHLORIDE ER 10 MEQ PO TBCR: 10.0000 meq | EXTENDED_RELEASE_TABLET | Freq: Every day | ORAL | 3 refills | Status: DC

## 2022-07-26 MED ORDER — PROPRANOLOL HCL 20 MG PO TABS: 20.0000 mg | ORAL_TABLET | Freq: Three times a day (TID) | ORAL | 3 refills | Status: DC | PRN

## 2022-07-26 MED ORDER — CARVEDILOL 3.125 MG PO TABS: 3.1250 mg | ORAL_TABLET | Freq: Two times a day (BID) | ORAL | 3 refills | Status: DC

## 2022-07-26 MED ORDER — TORSEMIDE 20 MG PO TABS: 40.0000 mg | ORAL_TABLET | Freq: Every day | ORAL | 3 refills | Status: DC

## 2022-07-26 NOTE — Patient Instructions (Signed)
Medication Instructions:  No changes  If you need a refill on your cardiac medications before your next appointment, please call your pharmacy.   Lab work: No new labs needed  Testing/Procedures: No new testing needed  Follow-Up: At [facility], you and your health needs are our priority.  As part of our continuing mission to provide you with exceptional heart care, we have created designated Provider Care Teams.  These Care Teams include your primary Cardiologist (physician) and Advanced Practice Providers (APPs -  Physician Assistants and Nurse Practitioners) who all work together to provide you with the care you need, when you need it.  You will need a follow up appointment in 12 months  Providers on your designated Care Team:   Christopher Berge, NP Ryan Dunn, [name] Cadence Furth, [name]  COVID-19 Vaccine Information can be found at: [email or web address] For questions related to vaccine distribution or appointments, please email [email or web address] or call [phone].   

## 2022-08-08 ENCOUNTER — Ambulatory Visit (INDEPENDENT_AMBULATORY_CARE_PROVIDER_SITE_OTHER): Payer: Managed Care, Other (non HMO) | Admitting: Family Medicine

## 2022-08-08 DIAGNOSIS — Z3042 Encounter for surveillance of injectable contraceptive: Secondary | ICD-10-CM | POA: Diagnosis not present

## 2022-08-08 MED ORDER — MEDROXYPROGESTERONE ACETATE 150 MG/ML IM SUSP
150.0000 mg | Freq: Once | INTRAMUSCULAR | Status: AC
  Administered 2022-08-08: 150 mg via INTRAMUSCULAR

## 2022-08-08 NOTE — Progress Notes (Signed)
Last depo injection 05/12/22. Next due 10/24/22-11/07/22.

## 2022-09-07 NOTE — Progress Notes (Deleted)
Established patient visit   Patient: [name]   DOB: 1985-09-27   37 y.o. Female  MRN: [id number] Visit Date: 09/08/2022  Today's healthcare provider: [name], FNP   No chief complaint on file.  Subjective    HPI  .hpio  Medications: Outpatient Medications Prior to Visit  Medication Sig   albuterol (VENTOLIN HFA) 108 (90 Base) MCG/ACT inhaler Inhale 2 puffs into the lungs every 6 (six) hours as needed for wheezing or shortness of breath.   buPROPion (WELLBUTRIN XL) 150 MG 24 hr tablet Take 1 tablet (150 mg total) by mouth daily.   carvedilol (COREG) 3.125 MG tablet Take 1 tablet (3.125 mg total) by mouth 2 (two) times daily with a meal.   chlorpheniramine-HYDROcodone (TUSSIONEX) 10-8 MG/5ML Take 5 mLs by mouth every 12 (twelve) hours as needed for cough.   ciclopirox (PENLAC) 8 % solution Apply topically at bedtime. Apply over nail and surrounding skin. Apply daily over previous coat. After seven (7) days, may remove with alcohol and continue cycle.   cyclobenzaprine (FLEXERIL) 10 MG tablet Take 1 tablet (10 mg total) by mouth 3 (three) times daily as needed for muscle spasms.   FLUoxetine (PROZAC) 20 MG tablet Take 1 tablet (20 mg total) by mouth daily.   fluticasone-salmeterol (ADVAIR DISKUS) 250-50 MCG/ACT AEPB Inhale 1 puff into the lungs 2 (two) times daily.   Fluticasone-Umeclidin-Vilant (TRELEGY ELLIPTA) 200-62.5-25 MCG/ACT AEPB Inhale 1 puff into the lungs daily.   medroxyPROGESTERone (DEPO-PROVERA) 150 MG/ML injection Inject 150 mg into the muscle every 3 (three) months.   meloxicam (MOBIC) 15 MG tablet Take 1 tablet (15 mg total) by mouth daily.   metFORMIN (GLUCOPHAGE XR) 500 MG 24 hr tablet Take 2 tablets (1,000 mg total) by mouth 2 (two) times daily with breakfast and lunch.   metroNIDAZOLE (METROGEL) 0.75 % vaginal gel Place 1 Applicatorful vaginally 2 (two) times daily as needed.   montelukast (SINGULAIR) 10 MG tablet Take 1 tablet (10 mg total) by  mouth at bedtime as needed.   nystatin cream (MYCOSTATIN) Apply 1 application topically 2 (two) times daily as needed for dry skin.   omeprazole (PRILOSEC) 40 MG capsule Take 1 capsule (40 mg total) daily by mouth.   ondansetron (ZOFRAN ODT) 4 MG disintegrating tablet Take 1 tablet (4 mg total) by mouth every 8 (eight) hours as needed for nausea or vomiting.   Pancrelipase, Lip-Prot-Amyl, (ZENPEP) 40000-126000 units CPEP Take 1 capsule by mouth 3 (three) times daily as needed.   pantoprazole (PROTONIX) 40 MG tablet Take 1 tablet (40 mg total) by mouth 2 (two) times daily before a meal.   phentermine 37.5 MG capsule Take 1 capsule (37.5 mg total) by mouth every morning.   potassium chloride (KLOR-CON) 10 MEQ tablet Take 1 tablet (10 mEq total) by mouth daily.   propranolol (INDERAL) 20 MG tablet Take 1 tablet (20 mg total) by mouth 3 (three) times daily as needed. For palpitations   simethicone (MYLICON) 80 MG chewable tablet Chew 1 tablet (80 mg total) by mouth every 6 (six) hours as needed for flatulence.   tiZANidine (ZANAFLEX) 4 MG tablet 1/2-1 q HS prn   torsemide (DEMADEX) 20 MG tablet Take 2 tablets (40 mg total) by mouth daily.   Vitamin D, Ergocalciferol, (DRISDOL) 1.25 MG (50000 UNIT) CAPS capsule Take 1 capsule (50,000 Units total) by mouth every 7 (seven) days.   Facility-Administered Medications Prior to Visit  Medication Dose Route Frequency Provider  bupivacaine (MARCAINE) 0.5 % (with pres) injection 3 mL  3 mL Other Once Newton, Frederic, MD    Tyrell Antoniotems  {Labs  Heme  Chem  Endocrine  Serology  Results Review (optional):23779}   Objective    There were no vitals taken for this visit. {Show previous vital signs (optional):23777}  Physical Exam  ***  No results found for any visits on 09/08/22.  Assessment & Plan     ***  No follow-ups on file.      {provider attestation***:1}   [name], FNP  [facility] [phone] (phone) [phone] (fax)  [facility]

## 2022-09-08 ENCOUNTER — Ambulatory Visit: Payer: Managed Care, Other (non HMO) | Admitting: Family Medicine

## 2022-10-06 ENCOUNTER — Ambulatory Visit: Payer: Managed Care, Other (non HMO) | Admitting: Family Medicine

## 2022-10-06 ENCOUNTER — Encounter: Payer: Self-pay | Admitting: Family Medicine

## 2022-10-06 VITALS — BP 126/83 | HR 96 | Temp 98.7°F | Resp 16 | Ht 61.0 in | Wt 336.1 lb

## 2022-10-06 DIAGNOSIS — K130 Diseases of lips: Secondary | ICD-10-CM | POA: Insufficient documentation

## 2022-10-06 MED ORDER — TRIAMCINOLONE ACETONIDE 0.025 % EX OINT: 1.0000 | TOPICAL_OINTMENT | Freq: Two times a day (BID) | CUTANEOUS | 0 refills | Status: AC

## 2022-10-06 NOTE — Progress Notes (Signed)
I,Melanie  Bowers,acting as a Neurosurgeon for Melanie Kindle, FNP.,have documented all relevant documentation on the behalf of Melanie Kindle, FNP,as directed by  Melanie Kindle, FNP while in the presence of Melanie Kindle, FNP.   Established patient visit  Patient: [name]   DOB: 18-Apr-1986   37 y.o. Female  MRN: [id number] Visit Date: 10/06/2022  Today's healthcare provider: [name], FNP   Introduced to nurse practitioner role and practice setting.  All questions answered.  Discussed provider/patient relationship and expectations.  Subjective    HPI  Patient is here today for lighter spots in skin on chin area. Reports that just now noticed it and there is some burning sensation to a certain area.  Medications: Outpatient Medications Prior to Visit  Medication Sig   albuterol (VENTOLIN HFA) 108 (90 Base) MCG/ACT inhaler Inhale 2 puffs into the lungs every 6 (six) hours as needed for wheezing or shortness of breath.   buPROPion (WELLBUTRIN XL) 150 MG 24 hr tablet Take 1 tablet (150 mg total) by mouth daily.   carvedilol (COREG) 3.125 MG tablet Take 1 tablet (3.125 mg total) by mouth 2 (two) times daily with a meal.   chlorpheniramine-HYDROcodone (TUSSIONEX) 10-8 MG/5ML Take 5 mLs by mouth every 12 (twelve) hours as needed for cough.   ciclopirox (PENLAC) 8 % solution Apply topically at bedtime. Apply over nail and surrounding skin. Apply daily over previous coat. After seven (7) days, may remove with alcohol and continue cycle.   cyclobenzaprine (FLEXERIL) 10 MG tablet Take 1 tablet (10 mg total) by mouth 3 (three) times daily as needed for muscle spasms.   FLUoxetine (PROZAC) 20 MG tablet Take 1 tablet (20 mg total) by mouth daily.   Fluticasone-Umeclidin-Vilant (TRELEGY ELLIPTA) 200-62.5-25 MCG/ACT AEPB Inhale 1 puff into the lungs daily.   medroxyPROGESTERone (DEPO-PROVERA) 150 MG/ML injection Inject 150 mg into the muscle every 3 (three) months.   meloxicam (MOBIC) 15 MG  tablet Take 1 tablet (15 mg total) by mouth daily.   metFORMIN (GLUCOPHAGE XR) 500 MG 24 hr tablet Take 2 tablets (1,000 mg total) by mouth 2 (two) times daily with breakfast and lunch.   metroNIDAZOLE (METROGEL) 0.75 % vaginal gel Place 1 Applicatorful vaginally 2 (two) times daily as needed.   montelukast (SINGULAIR) 10 MG tablet Take 1 tablet (10 mg total) by mouth at bedtime as needed.   nystatin cream (MYCOSTATIN) Apply 1 application topically 2 (two) times daily as needed for dry skin.   omeprazole (PRILOSEC) 40 MG capsule Take 1 capsule (40 mg total) daily by mouth.   ondansetron (ZOFRAN ODT) 4 MG disintegrating tablet Take 1 tablet (4 mg total) by mouth every 8 (eight) hours as needed for nausea or vomiting.   Pancrelipase, Lip-Prot-Amyl, (ZENPEP) 40000-126000 units CPEP Take 1 capsule by mouth 3 (three) times daily as needed.   pantoprazole (PROTONIX) 40 MG tablet Take 1 tablet (40 mg total) by mouth 2 (two) times daily before a meal.   phentermine 37.5 MG capsule Take 1 capsule (37.5 mg total) by mouth every morning.   potassium chloride (KLOR-CON) 10 MEQ tablet Take 1 tablet (10 mEq total) by mouth daily.   propranolol (INDERAL) 20 MG tablet Take 1 tablet (20 mg total) by mouth 3 (three) times daily as needed. For palpitations   simethicone (MYLICON) 80 MG chewable tablet Chew 1 tablet (80 mg total) by mouth every 6 (six) hours as needed for flatulence.   tiZANidine (ZANAFLEX) 4 MG  tablet 1/2-1 q HS prn   torsemide (DEMADEX) 20 MG tablet Take 2 tablets (40 mg total) by mouth daily.   Vitamin D, Ergocalciferol, (DRISDOL) 1.25 MG (50000 UNIT) CAPS capsule Take 1 capsule (50,000 Units total) by mouth every 7 (seven) days.   fluticasone-salmeterol (ADVAIR DISKUS) 250-50 MCG/ACT AEPB Inhale 1 puff into the lungs 2 (two) times daily.   Facility-Administered Medications Prior to Visit  Medication Dose Route Frequency Provider   bupivacaine (MARCAINE) 0.5 % (with pres) injection 3 mL  3 mL  Other Once Tyrell Antonio, MD    Review of Systems  Skin:  Positive for color change.  All other systems reviewed and are negative.    Objective    BP 126/83 (BP Location: Left Wrist, Patient Position: Sitting, Cuff Size: Normal)   Pulse 96   Temp 98.7 F (37.1 C) (Oral)   Resp 16   Ht 5\' 1"  (1.549 m)   Wt (!) 336 lb 1.6 oz (152.5 kg)   SpO2 98%   BMI 63.51 kg/m   Physical Exam Vitals and nursing note reviewed.  Constitutional:      General: She is not in acute distress.    Appearance: Normal appearance. She is obese. She is not ill-appearing, toxic-appearing or diaphoretic.  HENT:     Head: Normocephalic and atraumatic.  Cardiovascular:     Rate and Rhythm: Normal rate and regular rhythm.     Pulses: Normal pulses.     Heart sounds: Normal heart sounds. No murmur heard.    No friction rub. No gallop.  Pulmonary:     Effort: Pulmonary effort is normal. No respiratory distress.     Breath sounds: Normal breath sounds. No stridor. No wheezing, rhonchi or rales.  Chest:     Chest wall: No tenderness.  Musculoskeletal:        General: No swelling, tenderness, deformity or signs of injury. Normal range of motion.     Right lower leg: No edema.     Left lower leg: No edema.  Skin:    General: Skin is warm and dry.     Capillary Refill: Capillary refill takes less than 2 seconds.     Coloration: Skin is not jaundiced or pale.     Findings: No bruising, erythema, lesion or rash.  Neurological:     General: No focal deficit present.     Mental Status: She is alert and oriented to person, place, and time. Mental status is at baseline.     Cranial Nerves: No cranial nerve deficit.     Sensory: No sensory deficit.     Motor: No weakness.     Coordination: Coordination normal.  Psychiatric:        Mood and Affect: Mood normal.        Behavior: Behavior normal.        Thought Content: Thought content normal.        Judgment: Judgment normal.    No results found for any  visits on 10/06/22.  Assessment & Plan     Problem List Items Addressed This Visit       Digestive   Angular cheilosis - Primary    Acute x2 days; start low dose kenalog with tracking of s/s Refer to derm for next steps Continue to monitor foods, drinks, makeup, soap/detergents etc       Relevant Orders   Ambulatory referral to Dermatology   Return if symptoms worsen or fail to improve.     I,  Melanie Kindle, FNP, have reviewed all documentation for this visit. The documentation on 10/06/22 for the exam, diagnosis, procedures, and orders are all accurate and complete.  Melanie Kindle, FNP  [facility] [phone] (phone) [phone] (fax)  [facility]

## 2022-10-06 NOTE — Assessment & Plan Note (Signed)
Acute x2 days; start low dose kenalog with tracking of s/s Refer to derm for next steps Continue to monitor foods, drinks, makeup, soap/detergents etc

## 2022-10-25 ENCOUNTER — Encounter: Payer: Self-pay | Admitting: Family Medicine

## 2022-10-25 ENCOUNTER — Ambulatory Visit (INDEPENDENT_AMBULATORY_CARE_PROVIDER_SITE_OTHER): Payer: Managed Care, Other (non HMO) | Admitting: Family Medicine

## 2022-10-25 VITALS — BP 129/94 | HR 90 | Ht 60.0 in | Wt 335.0 lb

## 2022-10-25 DIAGNOSIS — Z3042 Encounter for surveillance of injectable contraceptive: Secondary | ICD-10-CM | POA: Diagnosis not present

## 2022-10-25 MED ORDER — MEDROXYPROGESTERONE ACETATE 150 MG/ML IM SUSP
150.0000 mg | Freq: Once | INTRAMUSCULAR | Status: AC
Start: 2022-10-25 — End: 2022-10-25
  Administered 2022-10-25: 150 mg via INTRAMUSCULAR

## 2022-10-25 NOTE — Addendum Note (Signed)
Addended by: Shelly Bombard on: 10/25/2022 10:31 AM   Modules accepted: Orders

## 2022-10-25 NOTE — Progress Notes (Signed)
Patient presented for CMA administration of Depo. Tolerated injection well; advised of return appt for continued use of medication. Physical assessment not performed by NP.  [name], FNP  [facility] [address] [place], [place] [address] [phone] (phone) [phone] (fax) [facility]

## 2023-01-24 ENCOUNTER — Ambulatory Visit (INDEPENDENT_AMBULATORY_CARE_PROVIDER_SITE_OTHER): Payer: Managed Care, Other (non HMO) | Admitting: Family Medicine

## 2023-01-24 DIAGNOSIS — Z91199 Patient's noncompliance with other medical treatment and regimen due to unspecified reason: Secondary | ICD-10-CM

## 2023-01-24 NOTE — Progress Notes (Signed)
Patient was not seen for appt d/t no call, no show, or late arrival >10 mins past appt time.   Elise T Payne, FNP  [facility] [address] [place], [place] [address] [phone] (phone) [phone] (fax) [facility]  

## 2023-01-25 ENCOUNTER — Ambulatory Visit: Payer: Managed Care, Other (non HMO) | Admitting: Family Medicine

## 2023-02-01 ENCOUNTER — Ambulatory Visit (INDEPENDENT_AMBULATORY_CARE_PROVIDER_SITE_OTHER): Payer: Managed Care, Other (non HMO) | Admitting: Family Medicine

## 2023-02-01 ENCOUNTER — Encounter: Payer: Self-pay | Admitting: Family Medicine

## 2023-02-01 ENCOUNTER — Ambulatory Visit: Payer: Managed Care, Other (non HMO)

## 2023-02-01 VITALS — BP 135/80 | HR 118 | Ht 61.0 in | Wt 341.4 lb

## 2023-02-01 DIAGNOSIS — Z3042 Encounter for surveillance of injectable contraceptive: Secondary | ICD-10-CM | POA: Diagnosis not present

## 2023-02-01 LAB — POCT URINE PREGNANCY: Preg Test, Ur: NEGATIVE

## 2023-02-01 MED ORDER — MEDROXYPROGESTERONE ACETATE 150 MG/ML IM SUSP: 150.0000 mg | INTRAMUSCULAR | 4 refills | Status: DC

## 2023-02-01 NOTE — Assessment & Plan Note (Signed)
Due for DEPO; however, missed window. POC pregnancy test completed. Medication called into pharmacy for CMA to administer in office due to no office supply.

## 2023-02-01 NOTE — Progress Notes (Signed)
Established patient visit   Patient: [name]   DOB: Aug 01, 1985   37 y.o. Female  MRN: [id number] Visit Date: 02/01/2023  Today's healthcare provider: [name], FNP  Introduced to nurse practitioner role and practice setting.  All questions answered.  Discussed provider/patient relationship and expectations.  Subjective    HPI HPI     Medical Management of Chronic Issues    Additional comments: No concerns today       Last edited by Rolly Salter, CMA on 02/01/2023  1:47 PM.      Medications: Outpatient Medications Prior to Visit  Medication Sig   albuterol (VENTOLIN HFA) 108 (90 Base) MCG/ACT inhaler Inhale 2 puffs into the lungs every 6 (six) hours as needed for wheezing or shortness of breath.   buPROPion (WELLBUTRIN XL) 150 MG 24 hr tablet Take 1 tablet (150 mg total) by mouth daily.   carvedilol (COREG) 3.125 MG tablet Take 1 tablet (3.125 mg total) by mouth 2 (two) times daily with a meal.   chlorpheniramine-HYDROcodone (TUSSIONEX) 10-8 MG/5ML Take 5 mLs by mouth every 12 (twelve) hours as needed for cough.   ciclopirox (PENLAC) 8 % solution Apply topically at bedtime. Apply over nail and surrounding skin. Apply daily over previous coat. After seven (7) days, may remove with alcohol and continue cycle.   cyclobenzaprine (FLEXERIL) 10 MG tablet Take 1 tablet (10 mg total) by mouth 3 (three) times daily as needed for muscle spasms.   FLUoxetine (PROZAC) 20 MG tablet Take 1 tablet (20 mg total) by mouth daily.   Fluticasone-Umeclidin-Vilant (TRELEGY ELLIPTA) 200-62.5-25 MCG/ACT AEPB Inhale 1 puff into the lungs daily.   meloxicam (MOBIC) 15 MG tablet Take 1 tablet (15 mg total) by mouth daily.   metFORMIN (GLUCOPHAGE XR) 500 MG 24 hr tablet Take 2 tablets (1,000 mg total) by mouth 2 (two) times daily with breakfast and lunch.   metroNIDAZOLE (METROGEL) 0.75 % vaginal gel Place 1 Applicatorful vaginally 2 (two) times daily as needed.   montelukast (SINGULAIR)  10 MG tablet Take 1 tablet (10 mg total) by mouth at bedtime as needed.   nystatin cream (MYCOSTATIN) Apply 1 application topically 2 (two) times daily as needed for dry skin.   omeprazole (PRILOSEC) 40 MG capsule Take 1 capsule (40 mg total) daily by mouth.   ondansetron (ZOFRAN ODT) 4 MG disintegrating tablet Take 1 tablet (4 mg total) by mouth every 8 (eight) hours as needed for nausea or vomiting.   Pancrelipase, Lip-Prot-Amyl, (ZENPEP) 40000-126000 units CPEP Take 1 capsule by mouth 3 (three) times daily as needed.   pantoprazole (PROTONIX) 40 MG tablet Take 1 tablet (40 mg total) by mouth 2 (two) times daily before a meal.   phentermine 37.5 MG capsule Take 1 capsule (37.5 mg total) by mouth every morning.   potassium chloride (KLOR-CON) 10 MEQ tablet Take 1 tablet (10 mEq total) by mouth daily.   propranolol (INDERAL) 20 MG tablet Take 1 tablet (20 mg total) by mouth 3 (three) times daily as needed. For palpitations   simethicone (MYLICON) 80 MG chewable tablet Chew 1 tablet (80 mg total) by mouth every 6 (six) hours as needed for flatulence.   tiZANidine (ZANAFLEX) 4 MG tablet 1/2-1 q HS prn   torsemide (DEMADEX) 20 MG tablet Take 2 tablets (40 mg total) by mouth daily.   triamcinolone (KENALOG) 0.025 % ointment Apply 1 Application topically 2 (two) times daily.   Vitamin D, Ergocalciferol, (DRISDOL) 1.25 MG (50000 UNIT) CAPS  capsule Take 1 capsule (50,000 Units total) by mouth every 7 (seven) days.   [DISCONTINUED] medroxyPROGESTERone (DEPO-PROVERA) 150 MG/ML injection Inject 150 mg into the muscle every 3 (three) months.   fluticasone-salmeterol (ADVAIR DISKUS) 250-50 MCG/ACT AEPB Inhale 1 puff into the lungs 2 (two) times daily.   Facility-Administered Medications Prior to Visit  Medication Dose Route Frequency Provider   bupivacaine (MARCAINE) 0.5 % (with pres) injection 3 mL  3 mL Other Once Tyrell Antonio, MD     Objective    BP 135/80 (BP Location: Right Wrist, Patient  Position: Sitting, Cuff Size: Normal)   Pulse (!) 118   Ht 5\' 1"  (1.549 m)   Wt (!) 341 lb 6.4 oz (154.9 kg)   SpO2 100%   BMI 64.51 kg/m   Physical Exam  No PE was completed  No results found for any visits on 02/01/23.  Assessment & Plan     Problem List Items Addressed This Visit       Other   Encounter for surveillance of injectable contraceptive - Primary    Due for DEPO; however, missed window. POC pregnancy test completed. Medication called into pharmacy for CMA to administer in office due to no office supply.       Relevant Orders   POCT Pregnancy, Urine   Return in about 3 months (around 05/03/2023) for nurse follow up, medication administration.     Leilani Merl, FNP, have reviewed all documentation for this visit. The documentation on 02/01/23 for the exam, diagnosis, procedures, and orders are all accurate and complete.  [name], FNP  [facility] [phone] (phone) [phone] (fax)  [facility]

## 2023-02-01 NOTE — Addendum Note (Signed)
Addended by: Merita Norton T on: 02/01/2023 02:18 PM   Modules accepted: Orders, Level of Service

## 2023-02-01 NOTE — Addendum Note (Signed)
Addended by: Rolly Salter on: 02/01/2023 02:07 PM   Modules accepted: Orders

## 2023-03-20 ENCOUNTER — Telehealth: Payer: Self-pay | Admitting: Family Medicine

## 2023-03-20 NOTE — Telephone Encounter (Signed)
Pt is calling in because she wants [name] to start a Prior Authorization for Zepbound. Pt says she wants to know if she needs to come in first or could it be ran to see if it would even be approved. Pt is requesting a call back.

## 2023-03-21 ENCOUNTER — Ambulatory Visit (INDEPENDENT_AMBULATORY_CARE_PROVIDER_SITE_OTHER): Payer: Managed Care, Other (non HMO) | Admitting: Family Medicine

## 2023-03-21 ENCOUNTER — Encounter: Payer: Self-pay | Admitting: Family Medicine

## 2023-03-21 DIAGNOSIS — I5032 Chronic diastolic (congestive) heart failure: Secondary | ICD-10-CM | POA: Diagnosis not present

## 2023-03-21 DIAGNOSIS — I10 Essential (primary) hypertension: Secondary | ICD-10-CM | POA: Diagnosis not present

## 2023-03-21 DIAGNOSIS — Z6841 Body Mass Index (BMI) 40.0 and over, adult: Secondary | ICD-10-CM

## 2023-03-21 DIAGNOSIS — E66813 Obesity, class 3: Secondary | ICD-10-CM | POA: Diagnosis not present

## 2023-03-21 MED ORDER — TIRZEPATIDE-WEIGHT MANAGEMENT 2.5 MG/0.5ML ~~LOC~~ SOLN: 2.5000 mg | SUBCUTANEOUS | 0 refills | Status: DC

## 2023-03-21 MED ORDER — TIRZEPATIDE-WEIGHT MANAGEMENT 2.5 MG/0.5ML ~~LOC~~ SOAJ: 2.5000 mg | SUBCUTANEOUS | 0 refills | Status: DC

## 2023-03-21 NOTE — Assessment & Plan Note (Signed)
Recommend restart of coreg to assist chronic htn and known chronic diastolic CHF Goal remains <140/<90

## 2023-03-21 NOTE — Assessment & Plan Note (Signed)
Chronic, Body mass index is 69.53 kg/m. Request to start zepbound to assist

## 2023-03-21 NOTE — Progress Notes (Signed)
Established patient visit   Patient: [name]   DOB: Sep 30, 1985   37 y.o. Female  MRN: [id number] Visit Date: 03/21/2023  Today's healthcare provider: [name], FNP  Introduced to nurse practitioner role and practice setting.  All questions answered.  Discussed provider/patient relationship and expectations.  Subjective    HPI HPI     Weight Loss    Additional comments: Discuss weight loss medication zepbound      Last edited by Clois Comber on 03/21/2023  1:30 PM.      Medications: Outpatient Medications Prior to Visit  Medication Sig Note   albuterol (VENTOLIN HFA) 108 (90 Base) MCG/ACT inhaler Inhale 2 puffs into the lungs every 6 (six) hours as needed for wheezing or shortness of breath.    buPROPion (WELLBUTRIN XL) 150 MG 24 hr tablet Take 1 tablet (150 mg total) by mouth daily.    chlorpheniramine-HYDROcodone (TUSSIONEX) 10-8 MG/5ML Take 5 mLs by mouth every 12 (twelve) hours as needed for cough.    ciclopirox (PENLAC) 8 % solution Apply topically at bedtime. Apply over nail and surrounding skin. Apply daily over previous coat. After seven (7) days, may remove with alcohol and continue cycle.    cyclobenzaprine (FLEXERIL) 10 MG tablet Take 1 tablet (10 mg total) by mouth 3 (three) times daily as needed for muscle spasms.    FLUoxetine (PROZAC) 20 MG tablet Take 1 tablet (20 mg total) by mouth daily.    Fluticasone-Umeclidin-Vilant (TRELEGY ELLIPTA) 200-62.5-25 MCG/ACT AEPB Inhale 1 puff into the lungs daily.    meloxicam (MOBIC) 15 MG tablet Take 1 tablet (15 mg total) by mouth daily.    metFORMIN (GLUCOPHAGE XR) 500 MG 24 hr tablet Take 2 tablets (1,000 mg total) by mouth 2 (two) times daily with breakfast and lunch.    metroNIDAZOLE (METROGEL) 0.75 % vaginal gel Place 1 Applicatorful vaginally 2 (two) times daily as needed.    montelukast (SINGULAIR) 10 MG tablet Take 1 tablet (10 mg total) by mouth at bedtime as needed.    nystatin cream (MYCOSTATIN)  Apply 1 application topically 2 (two) times daily as needed for dry skin.    omeprazole (PRILOSEC) 40 MG capsule Take 1 capsule (40 mg total) daily by mouth.    ondansetron (ZOFRAN ODT) 4 MG disintegrating tablet Take 1 tablet (4 mg total) by mouth every 8 (eight) hours as needed for nausea or vomiting.    Pancrelipase, Lip-Prot-Amyl, (ZENPEP) 40000-126000 units CPEP Take 1 capsule by mouth 3 (three) times daily as needed.    pantoprazole (PROTONIX) 40 MG tablet Take 1 tablet (40 mg total) by mouth 2 (two) times daily before a meal.    phentermine 37.5 MG capsule Take 1 capsule (37.5 mg total) by mouth every morning.    potassium chloride (KLOR-CON) 10 MEQ tablet Take 1 tablet (10 mEq total) by mouth daily.    propranolol (INDERAL) 20 MG tablet Take 1 tablet (20 mg total) by mouth 3 (three) times daily as needed. For palpitations    simethicone (MYLICON) 80 MG chewable tablet Chew 1 tablet (80 mg total) by mouth every 6 (six) hours as needed for flatulence.    tiZANidine (ZANAFLEX) 4 MG tablet 1/2-1 q HS prn    torsemide (DEMADEX) 20 MG tablet Take 2 tablets (40 mg total) by mouth daily.    triamcinolone (KENALOG) 0.025 % ointment Apply 1 Application topically 2 (two) times daily.    Vitamin D, Ergocalciferol, (DRISDOL) 1.25 MG (50000 UNIT) CAPS capsule  Take 1 capsule (50,000 Units total) by mouth every 7 (seven) days.    carvedilol (COREG) 3.125 MG tablet Take 1 tablet (3.125 mg total) by mouth 2 (two) times daily with a meal. (Patient not taking: Reported on 03/21/2023)    fluticasone-salmeterol (ADVAIR DISKUS) 250-50 MCG/ACT AEPB Inhale 1 puff into the lungs 2 (two) times daily.    medroxyPROGESTERone (DEPO-PROVERA) 150 MG/ML injection Inject 1 mL (150 mg total) into the muscle every 3 (three) months. (Patient not taking: Reported on 03/21/2023) 03/21/2023: Wants to give her body a break from it    Facility-Administered Medications Prior to Visit  Medication Dose Route Frequency Provider    bupivacaine (MARCAINE) 0.5 % (with pres) injection 3 mL  3 mL Other Once Tyrell Antonio, MD     Objective    BP 135/80 Comment: prev appt  Pulse 94   Temp 98.2 F (36.8 C)   Resp 14   Ht 5' (1.524 m)   Wt (!) 356 lb (161.5 kg)   SpO2 94%   BMI 69.53 kg/m   Physical Exam Constitutional:      Appearance: Normal appearance. She is obese.  Cardiovascular:     Rate and Rhythm: Normal rate.     Pulses: Normal pulses.  Pulmonary:     Effort: Pulmonary effort is normal.  Skin:    General: Skin is warm and dry.  Neurological:     General: No focal deficit present.     Mental Status: She is alert and oriented to person, place, and time. Mental status is at baseline.  Psychiatric:        Mood and Affect: Mood normal.        Behavior: Behavior normal.        Thought Content: Thought content normal.        Judgment: Judgment normal.     No results found for any visits on 03/21/23.  Assessment & Plan     Problem List Items Addressed This Visit       Cardiovascular and Mediastinum   Chronic diastolic CHF (congestive heart failure) (HCC)   Primary hypertension    Recommend restart of coreg to assist chronic htn and known chronic diastolic CHF Goal remains <140/<90        Other   Class 3 severe obesity due to excess calories with serious comorbidity and body mass index (BMI) of 60.0 to 69.9 in adult [facility])    Chronic, Body mass index is 69.53 kg/m. Request to start zepbound to assist       Relevant Medications   tirzepatide (ZEPBOUND) 2.5 MG/0.5ML Pen   Morbid obesity (HCC) - Primary    Chronic, stable Body mass index is 69.53 kg/m. Discussed importance of healthy weight management Discussed diet and exercise       Relevant Medications   tirzepatide (ZEPBOUND) 2.5 MG/0.5ML Pen   No follow-ups on file.     Leilani Merl, FNP, have reviewed all documentation for this visit. The documentation on 03/21/23 for the exam, diagnosis, procedures, and orders are all  accurate and complete.  [name], FNP  [facility] [phone] (phone) [phone] (fax)  [facility]

## 2023-03-21 NOTE — Assessment & Plan Note (Signed)
Chronic, stable Body mass index is 69.53 kg/m. Discussed importance of healthy weight management Discussed diet and exercise

## 2023-03-23 ENCOUNTER — Telehealth: Payer: Self-pay

## 2023-03-23 NOTE — Telephone Encounter (Signed)
PA initiated waiting on notes to be be attach to send to insurance plan

## 2023-03-23 NOTE — Telephone Encounter (Signed)
Copied from CRM [id number]. Topic: General - Other >> Mar 23, 2023 10:21 AM Macon Large wrote: Reason for CRM: Pt called for update on prior authorization. Pt requests call back to advise. Cb# [id number]

## 2023-03-27 NOTE — Telephone Encounter (Signed)
Zepbound was approved.

## 2023-03-28 ENCOUNTER — Other Ambulatory Visit: Payer: Self-pay | Admitting: Family Medicine

## 2023-03-28 DIAGNOSIS — R110 Nausea: Secondary | ICD-10-CM

## 2023-03-28 MED ORDER — ONDANSETRON 4 MG PO TBDP
4.0000 mg | ORAL_TABLET | Freq: Three times a day (TID) | ORAL | 0 refills | Status: DC | PRN
Start: 2023-03-28 — End: 2023-07-19

## 2023-05-03 ENCOUNTER — Ambulatory Visit: Payer: Managed Care, Other (non HMO) | Admitting: Family Medicine

## 2023-05-03 ENCOUNTER — Encounter: Payer: Self-pay | Admitting: Family Medicine

## 2023-05-03 DIAGNOSIS — I5032 Chronic diastolic (congestive) heart failure: Secondary | ICD-10-CM | POA: Diagnosis not present

## 2023-05-03 MED ORDER — TIRZEPATIDE-WEIGHT MANAGEMENT 5 MG/0.5ML ~~LOC~~ SOAJ: 5.0000 mg | SUBCUTANEOUS | 0 refills | Status: DC

## 2023-05-03 MED ORDER — TIRZEPATIDE-WEIGHT MANAGEMENT 5 MG/0.5ML ~~LOC~~ SOLN: 5.0000 mg | SUBCUTANEOUS | 0 refills | Status: DC

## 2023-05-03 NOTE — Progress Notes (Signed)
Established patient visit  Patient: [name]   DOB: November 23, 1985   37 y.o. Female  MRN: [id number] Visit Date: 05/03/2023  Today's healthcare provider: [name], FNP  Introduced to nurse practitioner role and practice setting.  All questions answered.  Discussed provider/patient relationship and expectations.  Chief Complaint  Patient presents with   Follow-up    Zepbound refill   Subjective    HPI HPI     Follow-up    Additional comments: Zepbound refill      Last edited by Shelly Bombard, CMA on 05/03/2023  1:41 PM.      Medications: Outpatient Medications Prior to Visit  Medication Sig   albuterol (VENTOLIN HFA) 108 (90 Base) MCG/ACT inhaler Inhale 2 puffs into the lungs every 6 (six) hours as needed for wheezing or shortness of breath.   buPROPion (WELLBUTRIN XL) 150 MG 24 hr tablet Take 1 tablet (150 mg total) by mouth daily.   FLUoxetine (PROZAC) 20 MG tablet Take 1 tablet (20 mg total) by mouth daily.   omeprazole (PRILOSEC) 40 MG capsule Take 1 capsule (40 mg total) daily by mouth.   ondansetron (ZOFRAN ODT) 4 MG disintegrating tablet Take 1 tablet (4 mg total) by mouth every 8 (eight) hours as needed for nausea or vomiting.   potassium chloride (KLOR-CON) 10 MEQ tablet Take 1 tablet (10 mEq total) by mouth daily.   propranolol (INDERAL) 20 MG tablet Take 1 tablet (20 mg total) by mouth 3 (three) times daily as needed. For palpitations   torsemide (DEMADEX) 20 MG tablet Take 2 tablets (40 mg total) by mouth daily.   triamcinolone (KENALOG) 0.025 % ointment Apply 1 Application topically 2 (two) times daily.   Vitamin D, Ergocalciferol, (DRISDOL) 1.25 MG (50000 UNIT) CAPS capsule Take 1 capsule (50,000 Units total) by mouth every 7 (seven) days.   [DISCONTINUED] tirzepatide (ZEPBOUND) 2.5 MG/0.5ML Pen Inject 2.5 mg into the skin once a week.   carvedilol (COREG) 3.125 MG tablet Take 1 tablet (3.125 mg total) by mouth 2 (two) times daily with a meal. (Patient  not taking: Reported on 03/21/2023)   ciclopirox (PENLAC) 8 % solution Apply topically at bedtime. Apply over nail and surrounding skin. Apply daily over previous coat. After seven (7) days, may remove with alcohol and continue cycle. (Patient not taking: Reported on 05/03/2023)   cyclobenzaprine (FLEXERIL) 10 MG tablet Take 1 tablet (10 mg total) by mouth 3 (three) times daily as needed for muscle spasms. (Patient not taking: Reported on 05/03/2023)   fluticasone-salmeterol (ADVAIR DISKUS) 250-50 MCG/ACT AEPB Inhale 1 puff into the lungs 2 (two) times daily.   Fluticasone-Umeclidin-Vilant (TRELEGY ELLIPTA) 200-62.5-25 MCG/ACT AEPB Inhale 1 puff into the lungs daily. (Patient not taking: Reported on 05/03/2023)   metFORMIN (GLUCOPHAGE XR) 500 MG 24 hr tablet Take 2 tablets (1,000 mg total) by mouth 2 (two) times daily with breakfast and lunch. (Patient not taking: Reported on 05/03/2023)   metroNIDAZOLE (METROGEL) 0.75 % vaginal gel Place 1 Applicatorful vaginally 2 (two) times daily as needed. (Patient not taking: Reported on 05/03/2023)   montelukast (SINGULAIR) 10 MG tablet Take 1 tablet (10 mg total) by mouth at bedtime as needed. (Patient not taking: Reported on 05/03/2023)   nystatin cream (MYCOSTATIN) Apply 1 application topically 2 (two) times daily as needed for dry skin. (Patient not taking: Reported on 05/03/2023)   Pancrelipase, Lip-Prot-Amyl, (ZENPEP) 40000-126000 units CPEP Take 1 capsule by mouth 3 (three) times daily as needed.   simethicone (  MYLICON) 80 MG chewable tablet Chew 1 tablet (80 mg total) by mouth every 6 (six) hours as needed for flatulence. (Patient not taking: Reported on 05/03/2023)   tiZANidine (ZANAFLEX) 4 MG tablet 1/2-1 q HS prn (Patient not taking: Reported on 05/03/2023)   [DISCONTINUED] chlorpheniramine-HYDROcodone (TUSSIONEX) 10-8 MG/5ML Take 5 mLs by mouth every 12 (twelve) hours as needed for cough.   [DISCONTINUED] medroxyPROGESTERone (DEPO-PROVERA) 150 MG/ML  injection Inject 1 mL (150 mg total) into the muscle every 3 (three) months. (Patient not taking: Reported on 03/21/2023)   [DISCONTINUED] meloxicam (MOBIC) 15 MG tablet Take 1 tablet (15 mg total) by mouth daily.   [DISCONTINUED] pantoprazole (PROTONIX) 40 MG tablet Take 1 tablet (40 mg total) by mouth 2 (two) times daily before a meal.   [DISCONTINUED] phentermine 37.5 MG capsule Take 1 capsule (37.5 mg total) by mouth every morning.   Facility-Administered Medications Prior to Visit  Medication Dose Route Frequency Provider   bupivacaine (MARCAINE) 0.5 % (with pres) injection 3 mL  3 mL Other Once Tyrell Antonio, MD   Last CBC Lab Results  Component Value Date   WBC 6.1 07/15/2022   HGB 12.6 07/15/2022   HCT 38.4 07/15/2022   MCV 91 07/15/2022   MCH 30.0 07/15/2022   RDW 13.9 07/15/2022   PLT 302 07/15/2022   Last metabolic panel Lab Results  Component Value Date   GLUCOSE 92 07/15/2022   NA 141 07/15/2022   K 4.1 07/15/2022   CL 103 07/15/2022   CO2 21 07/15/2022   BUN 10 07/15/2022   CREATININE 0.78 07/15/2022   EGFR 100 07/15/2022   CALCIUM 9.6 07/15/2022   PROT 7.1 07/15/2022   ALBUMIN 4.1 07/15/2022   LABGLOB 3.0 07/15/2022   AGRATIO 1.4 07/15/2022   BILITOT 0.2 07/15/2022   ALKPHOS 91 07/15/2022   AST 17 07/15/2022   ALT 13 07/15/2022   ANIONGAP 7 04/02/2021   Last lipids Lab Results  Component Value Date   CHOL 161 07/15/2022   HDL 44 07/15/2022   LDLCALC 104 (H) 07/15/2022   TRIG 64 07/15/2022   CHOLHDL 3.7 07/15/2022   Last hemoglobin A1c Lab Results  Component Value Date   HGBA1C 5.4 07/15/2022   Last thyroid functions Lab Results  Component Value Date   TSH 1.760 07/15/2022   Last vitamin D Lab Results  Component Value Date   VD25OH 11.5 (L) 06/15/2021     Objective    BP 139/75 (BP Location: Left Arm, Patient Position: Sitting, Cuff Size: Large)   Pulse 97   Ht 5' (1.524 m)   Wt (!) 345 lb (156.5 kg)   SpO2 100%   BMI 67.38  kg/m   BP Readings from Last 3 Encounters:  05/03/23 139/75  03/21/23 135/80  02/01/23 135/80   Wt Readings from Last 3 Encounters:  05/03/23 (!) 345 lb (156.5 kg)  03/21/23 (!) 356 lb (161.5 kg)  02/01/23 (!) 341 lb 6.4 oz (154.9 kg)   SpO2 Readings from Last 3 Encounters:  05/03/23 100%  03/21/23 94%  02/01/23 100%   Physical Exam Vitals and nursing note reviewed.  Constitutional:      General: She is not in acute distress.    Appearance: Normal appearance. She is obese. She is not ill-appearing, toxic-appearing or diaphoretic.  HENT:     Head: Normocephalic and atraumatic.  Cardiovascular:     Rate and Rhythm: Normal rate and regular rhythm.     Pulses: Normal pulses.     Heart  sounds: Normal heart sounds. No murmur heard.    No friction rub. No gallop.  Pulmonary:     Effort: Pulmonary effort is normal. No respiratory distress.     Breath sounds: Normal breath sounds. No stridor. No wheezing, rhonchi or rales.  Chest:     Chest wall: No tenderness.  Musculoskeletal:        General: Swelling present. No tenderness, deformity or signs of injury. Normal range of motion.     Right lower leg: No edema.     Left lower leg: No edema.  Skin:    General: Skin is warm and dry.     Capillary Refill: Capillary refill takes less than 2 seconds.     Coloration: Skin is not jaundiced or pale.     Findings: No bruising, erythema, lesion or rash.  Neurological:     General: No focal deficit present.     Mental Status: She is alert and oriented to person, place, and time. Mental status is at baseline.     Cranial Nerves: No cranial nerve deficit.     Sensory: No sensory deficit.     Motor: No weakness.     Coordination: Coordination normal.  Psychiatric:        Mood and Affect: Mood normal.        Behavior: Behavior normal.        Thought Content: Thought content normal.        Judgment: Judgment normal.     No results found for any visits on 05/03/23.  Assessment & Plan      Problem List Items Addressed This Visit       Cardiovascular and Mediastinum   Chronic diastolic CHF (congestive heart failure) (HCC)   Chronic, wishes to lose weight to assist with chronic heart failure Continues on zepbound and request for increase Body mass index is 67.38 kg/m. Discussed importance of healthy weight management Discussed diet and exercise         Other   Morbid obesity (HCC) - Primary   Chronic, improved Body mass index is 67.38 kg/m. Will titrate zepbound to assist Pt notes use of  Beet root, Linden leaf, Hawthorne, Hibiscus, Motherwart combined tea  Doing well with zepbound-happy with results Down 11 lbs since last OV on 10/29       Relevant Medications   tirzepatide (ZEPBOUND) 5 MG/0.5ML Pen   Return in about 4 weeks (around 05/31/2023) for chonic disease management.     Leilani Merl, FNP, have reviewed all documentation for this visit. The documentation on 05/07/23 for the exam, diagnosis, procedures, and orders are all accurate and complete.  [name], FNP  [facility] [phone] (phone) [phone] (fax)  [facility]

## 2023-05-07 NOTE — Assessment & Plan Note (Signed)
Chronic, improved Body mass index is 67.38 kg/m. Will titrate zepbound to assist Pt notes use of  Beet root, Linden leaf, Hawthorne, Hibiscus, Motherwart combined tea  Doing well with zepbound-happy with results Down 11 lbs since last OV on 10/29

## 2023-05-07 NOTE — Assessment & Plan Note (Signed)
Chronic, wishes to lose weight to assist with chronic heart failure Continues on zepbound and request for increase Body mass index is 67.38 kg/m. Discussed importance of healthy weight management Discussed diet and exercise

## 2023-06-01 ENCOUNTER — Other Ambulatory Visit: Payer: Self-pay | Admitting: Family Medicine

## 2023-06-01 NOTE — Telephone Encounter (Signed)
 Medication Refill -  Most Recent Primary Care Visit:  Provider: PAYNE, ELISE T  Department: [facility]  Visit Type: OFFICE VISIT  Date: 05/03/2023  Medication: Zepbound  5 MG   Has the patient contacted their pharmacy? Yes  (Agent: If yes, when and what did the pharmacy advise?) Contact PCP   Is this the correct pharmacy for this prescription? Yes  This is the patient's preferred pharmacy:   [facility] [address] [facility] - [place], [place] - [address] [place] [address] Phone: [phone] Fax: [phone]   Has the prescription been filled recently? Yes  Is the patient out of the medication? No  Has the patient been seen for an appointment in the last year OR does the patient have an upcoming appointment? Yes  Can we respond through MyChart? Yes  Agent: Please be advised that Rx refills may take up to 3 business days. We ask that you follow-up with your pharmacy.   Pt would like a call back when the medication is sent.

## 2023-06-05 ENCOUNTER — Ambulatory Visit: Payer: Managed Care, Other (non HMO) | Admitting: Family Medicine

## 2023-06-05 MED ORDER — TIRZEPATIDE-WEIGHT MANAGEMENT 5 MG/0.5ML ~~LOC~~ SOAJ: 5.0000 mg | SUBCUTANEOUS | 0 refills | Status: AC

## 2023-06-05 NOTE — Telephone Encounter (Signed)
 Requested Prescriptions  Pending Prescriptions Disp Refills   tirzepatide  (ZEPBOUND ) 5 MG/0.5ML Pen 2 mL 0    Sig: Inject 5 mg into the skin once a week.     Off-Protocol Failed - 06/05/2023 11:03 AM      Failed - Medication not assigned to a protocol, review manually.      Passed - Valid encounter within last 12 months    Recent Outpatient Visits           1 month ago Morbid obesity [facility])   [place] [facility] Emilio Marseille T, FNP   2 months ago Morbid obesity [facility])   [place] [facility] Emilio Marseille DASEN, FNP   8 months ago Angular cheilosis   [place] [facility] Emilio Marseille DASEN, FNP   10 months ago Morbid obesity [facility])   [place] [facility] Emilio Marseille T, FNP   12 months ago Moderate persistent asthma with acute exacerbation   [facility] Emilio Marseille DASEN, FNP       Future Appointments             In 1 week Simmons-Robinson, [place], MD [facility]

## 2023-06-14 NOTE — Progress Notes (Unsigned)
Established patient visit   Patient: [name]   DOB: 1985-06-12   38 y.o. Female  MRN: [id number] Visit Date: 06/15/2023  Today's healthcare provider: Ronnald Ramp, MD   No chief complaint on file.  Subjective       Discussed the use of AI scribe software for clinical note transcription with the patient, who gave verbal consent to proceed.  History of Present Illness             Past Medical History:  Diagnosis Date   Anxiety    Depression    GERD (gastroesophageal reflux disease)    Hypertension    Sleep apnea     Medications: Outpatient Medications Prior to Visit  Medication Sig   albuterol (VENTOLIN HFA) 108 (90 Base) MCG/ACT inhaler Inhale 2 puffs into the lungs every 6 (six) hours as needed for wheezing or shortness of breath.   buPROPion (WELLBUTRIN XL) 150 MG 24 hr tablet Take 1 tablet (150 mg total) by mouth daily.   carvedilol (COREG) 3.125 MG tablet Take 1 tablet (3.125 mg total) by mouth 2 (two) times daily with a meal. (Patient not taking: Reported on 03/21/2023)   ciclopirox (PENLAC) 8 % solution Apply topically at bedtime. Apply over nail and surrounding skin. Apply daily over previous coat. After seven (7) days, may remove with alcohol and continue cycle. (Patient not taking: Reported on 05/03/2023)   cyclobenzaprine (FLEXERIL) 10 MG tablet Take 1 tablet (10 mg total) by mouth 3 (three) times daily as needed for muscle spasms. (Patient not taking: Reported on 05/03/2023)   FLUoxetine (PROZAC) 20 MG tablet Take 1 tablet (20 mg total) by mouth daily.   fluticasone-salmeterol (ADVAIR DISKUS) 250-50 MCG/ACT AEPB Inhale 1 puff into the lungs 2 (two) times daily.   Fluticasone-Umeclidin-Vilant (TRELEGY ELLIPTA) 200-62.5-25 MCG/ACT AEPB Inhale 1 puff into the lungs daily. (Patient not taking: Reported on 05/03/2023)   metFORMIN (GLUCOPHAGE XR) 500 MG 24 hr tablet Take 2 tablets (1,000 mg total) by mouth 2 (two) times daily with breakfast  and lunch. (Patient not taking: Reported on 05/03/2023)   metroNIDAZOLE (METROGEL) 0.75 % vaginal gel Place 1 Applicatorful vaginally 2 (two) times daily as needed. (Patient not taking: Reported on 05/03/2023)   montelukast (SINGULAIR) 10 MG tablet Take 1 tablet (10 mg total) by mouth at bedtime as needed. (Patient not taking: Reported on 05/03/2023)   nystatin cream (MYCOSTATIN) Apply 1 application topically 2 (two) times daily as needed for dry skin. (Patient not taking: Reported on 05/03/2023)   omeprazole (PRILOSEC) 40 MG capsule Take 1 capsule (40 mg total) daily by mouth.   ondansetron (ZOFRAN ODT) 4 MG disintegrating tablet Take 1 tablet (4 mg total) by mouth every 8 (eight) hours as needed for nausea or vomiting.   Pancrelipase, Lip-Prot-Amyl, (ZENPEP) 40000-126000 units CPEP Take 1 capsule by mouth 3 (three) times daily as needed.   potassium chloride (KLOR-CON) 10 MEQ tablet Take 1 tablet (10 mEq total) by mouth daily.   propranolol (INDERAL) 20 MG tablet Take 1 tablet (20 mg total) by mouth 3 (three) times daily as needed. For palpitations   simethicone (MYLICON) 80 MG chewable tablet Chew 1 tablet (80 mg total) by mouth every 6 (six) hours as needed for flatulence. (Patient not taking: Reported on 05/03/2023)   tirzepatide (ZEPBOUND) 5 MG/0.5ML Pen Inject 5 mg into the skin once a week.   tiZANidine (ZANAFLEX) 4 MG tablet 1/2-1 q HS prn (Patient not taking: Reported on 05/03/2023)  torsemide (DEMADEX) 20 MG tablet Take 2 tablets (40 mg total) by mouth daily.   triamcinolone (KENALOG) 0.025 % ointment Apply 1 Application topically 2 (two) times daily.   Vitamin D, Ergocalciferol, (DRISDOL) 1.25 MG (50000 UNIT) CAPS capsule Take 1 capsule (50,000 Units total) by mouth every 7 (seven) days.   Facility-Administered Medications Prior to Visit  Medication Dose Route Frequency Provider   bupivacaine (MARCAINE) 0.5 % (with pres) injection 3 mL  3 mL Other Once Tyrell Antonio, MD    Review  of Systems  {Insert previous labs (optional):23779} {See past labs  Heme  Chem  Endocrine  Serology  Results Review (optional):1}   Objective    There were no vitals taken for this visit. {Insert last BP/Wt (optional):23777}{See vitals history (optional):1}    Physical Exam  ***  No results found for any visits on 06/15/23.  Assessment & Plan     Problem List Items Addressed This Visit       Cardiovascular and Mediastinum   Primary hypertension - Primary     Respiratory   Obstructive sleep apnea   Moderate persistent asthma with acute exacerbation    Assessment and Plan              No follow-ups on file.         Ronnald Ramp, MD  [facility] [phone] (phone) [phone] (fax)  [facility]

## 2023-06-15 ENCOUNTER — Encounter: Payer: Self-pay | Admitting: Family Medicine

## 2023-06-15 ENCOUNTER — Ambulatory Visit: Payer: Managed Care, Other (non HMO) | Admitting: Family Medicine

## 2023-06-15 VITALS — BP 162/97 | HR 112 | Temp 99.0°F | Ht 61.0 in | Wt 345.0 lb

## 2023-06-15 DIAGNOSIS — R509 Fever, unspecified: Secondary | ICD-10-CM

## 2023-06-15 DIAGNOSIS — J208 Acute bronchitis due to other specified organisms: Secondary | ICD-10-CM

## 2023-06-15 DIAGNOSIS — I10 Essential (primary) hypertension: Secondary | ICD-10-CM

## 2023-06-15 DIAGNOSIS — G4733 Obstructive sleep apnea (adult) (pediatric): Secondary | ICD-10-CM | POA: Diagnosis not present

## 2023-06-15 DIAGNOSIS — R067 Sneezing: Secondary | ICD-10-CM | POA: Diagnosis not present

## 2023-06-15 DIAGNOSIS — J309 Allergic rhinitis, unspecified: Secondary | ICD-10-CM | POA: Insufficient documentation

## 2023-06-15 DIAGNOSIS — J3089 Other allergic rhinitis: Secondary | ICD-10-CM

## 2023-06-15 DIAGNOSIS — J4541 Moderate persistent asthma with (acute) exacerbation: Secondary | ICD-10-CM

## 2023-06-15 LAB — POC COVID19 BINAXNOW: SARS Coronavirus 2 Ag: NEGATIVE

## 2023-06-15 LAB — POCT INFLUENZA A/B
Influenza A, POC: NEGATIVE
Influenza B, POC: NEGATIVE

## 2023-06-15 MED ORDER — TRIAMCINOLONE ACETONIDE 55 MCG/ACT NA AERO: 2.0000 | INHALATION_SPRAY | Freq: Every day | NASAL | 12 refills | Status: AC

## 2023-06-15 MED ORDER — ALBUTEROL SULFATE HFA 108 (90 BASE) MCG/ACT IN AERS: 2.0000 | INHALATION_SPRAY | Freq: Four times a day (QID) | RESPIRATORY_TRACT | 0 refills | Status: AC | PRN

## 2023-06-15 MED ORDER — TRELEGY ELLIPTA 200-62.5-25 MCG/ACT IN AEPB: 1.0000 | INHALATION_SPRAY | Freq: Every day | RESPIRATORY_TRACT | 12 refills | Status: AC

## 2023-06-15 MED ORDER — CETIRIZINE HCL 10 MG PO TABS: 10.0000 mg | ORAL_TABLET | Freq: Every day | ORAL | 11 refills | Status: AC

## 2023-06-15 NOTE — Patient Instructions (Signed)
VISIT SUMMARY:  During today's visit, we discussed your recent symptoms of nasal congestion, sneezing, postnasal drainage, and a sore throat, which have been present for the past three days. We also reviewed your ongoing weight management, asthma, and hypertension, as well as your current stress due to your mother's recent cancer diagnosis. Additionally, we talked about your allergies and the potential need for FMLA paperwork due to your health issues.  YOUR PLAN:  -UPPER RESPIRATORY INFECTION: An upper respiratory infection is a viral infection that affects the nose, throat, and airways. You have been experiencing symptoms such as congestion, sneezing, and a low-grade fever. We recommend starting cetirizine 10 mg daily, using a nasal spray like Nasacort or Flonase, staying hydrated, resting, and using a humidifier while sleeping. If your symptoms persist beyond a week, please follow up in 10 days.  -ASTHMA: Asthma is a condition where your airways narrow and swell, making it difficult to breathe. Your asthma may be exacerbated by your allergies. We reviewed your current inhaler use and will send more inhaler medication if needed. Managing your allergies is important to prevent asthma flare-ups.  -OBESITY: Obesity is a condition characterized by excessive body fat. You have lost 11 pounds since October 29th but your weight has been stable over the last two visits. You are currently taking Zovia 5 mg once weekly for weight loss. Due to your current illness, we advise not restarting Zovia until you are fully recovered to avoid dehydration and complications. We will schedule a follow-up in 3 months for weight management.  -HYPERTENSION: Hypertension, or high blood pressure, is a condition where the force of the blood against your artery walls is too high. Your blood pressure was elevated at 162/97 during this visit. We will continue to monitor this condition closely.  -GENERAL HEALTH MAINTENANCE: We  discussed the need for FMLA paperwork due to your back pain, migraines, asthma, allergies, anxiety, and depression. We will schedule a dedicated visit within the next month to complete this paperwork.  INSTRUCTIONS:  Please follow up in 10 days if your upper respiratory symptoms persist. We will also schedule a follow-up in 3 months for weight management and a dedicated visit within the next month for FMLA paperwork.

## 2023-06-26 ENCOUNTER — Telehealth: Payer: Self-pay | Admitting: Family Medicine

## 2023-06-26 MED ORDER — ZEPBOUND 2.5 MG/0.5ML ~~LOC~~ SOAJ: 2.5000 mg | SUBCUTANEOUS | 2 refills | Status: AC

## 2023-06-26 NOTE — Telephone Encounter (Signed)
Decreased dose sent to pharmacy. This may be delayed prescription as the dose change may require PA. Please notify patient of potential delay

## 2023-06-26 NOTE — Telephone Encounter (Signed)
Medication Refill -  Most Recent Primary Care Visit:  Provider: Ronnald Ramp  Department: [id number]  Visit Type: OFFICE VISIT  Date: 06/15/2023  Medication: tirzepatide (ZEPBOUND) 2.5 MG/0.5ML Pen  NOT 5 MG she says, she is returning from being sick and does not want the higher dose to avoid nausea symptoms.   Has the patient contacted their pharmacy? Yes (Agent: If no, request that the patient contact the pharmacy for the refill. If patient does not wish to contact the pharmacy document the reason why and proceed with request.) (Agent: If yes, when and what did the pharmacy advise?)  Is this the correct pharmacy for this prescription? Yes If no, delete pharmacy and type the correct one.  This is the patient's preferred pharmacy:    [facility] [address] [facility] - [place], [place] - [address] [facility] [address] [facility] [address] [place] [address] Phone: [phone] Fax: [phone]   Has the prescription been filled recently? Yes  Is the patient out of the medication? Yes  Has the patient been seen for an appointment in the last year OR does the patient have an upcoming appointment? Yes  Can we respond through MyChart? Yes  Agent: Please be advised that Rx refills may take up to 3 business days. We ask that you follow-up with your pharmacy.

## 2023-06-26 NOTE — Telephone Encounter (Signed)
Patient request tirzepatide (ZEPBOUND) 2.5 MG/0.5ML Pen instead of 5 MG. she says, she is returning from being sick and does not want the higher dose to avoid nausea symptoms. Please advise

## 2023-07-04 ENCOUNTER — Emergency Department: Payer: Managed Care, Other (non HMO)

## 2023-07-04 ENCOUNTER — Emergency Department
Admission: EM | Admit: 2023-07-04 | Discharge: 2023-07-04 | Disposition: A | Discharge status: Home / Self Care | Payer: Managed Care, Other (non HMO) | Attending: Emergency Medicine | Admitting: Emergency Medicine

## 2023-07-04 ENCOUNTER — Other Ambulatory Visit: Payer: Self-pay

## 2023-07-04 ENCOUNTER — Telehealth: Payer: Self-pay

## 2023-07-04 ENCOUNTER — Telehealth: Payer: Managed Care, Other (non HMO) | Admitting: Nurse Practitioner

## 2023-07-04 DIAGNOSIS — K219 Gastro-esophageal reflux disease without esophagitis: Secondary | ICD-10-CM | POA: Diagnosis not present

## 2023-07-04 DIAGNOSIS — I5032 Chronic diastolic (congestive) heart failure: Secondary | ICD-10-CM | POA: Diagnosis not present

## 2023-07-04 DIAGNOSIS — R600 Localized edema: Secondary | ICD-10-CM | POA: Insufficient documentation

## 2023-07-04 DIAGNOSIS — I10 Essential (primary) hypertension: Secondary | ICD-10-CM

## 2023-07-04 DIAGNOSIS — F419 Anxiety disorder, unspecified: Secondary | ICD-10-CM | POA: Insufficient documentation

## 2023-07-04 DIAGNOSIS — I110 Hypertensive heart disease with heart failure: Secondary | ICD-10-CM | POA: Insufficient documentation

## 2023-07-04 DIAGNOSIS — R079 Chest pain, unspecified: Secondary | ICD-10-CM | POA: Diagnosis present

## 2023-07-04 DIAGNOSIS — R0789 Other chest pain: Secondary | ICD-10-CM

## 2023-07-04 DIAGNOSIS — I5030 Unspecified diastolic (congestive) heart failure: Secondary | ICD-10-CM | POA: Diagnosis not present

## 2023-07-04 HISTORY — DX: Obesity, unspecified: E66.9

## 2023-07-04 HISTORY — DX: Gestational diabetes mellitus in pregnancy, unspecified control: O24.419

## 2023-07-04 LAB — CBC
HCT: 37.8 % (ref 36.0–46.0)
Hemoglobin: 12.4 g/dL (ref 12.0–15.0)
MCH: 29.6 pg (ref 26.0–34.0)
MCHC: 32.8 g/dL (ref 30.0–36.0)
MCV: 90.2 fL (ref 80.0–100.0)
Platelets: 346 10*3/uL (ref 150–400)
RBC: 4.19 MIL/uL (ref 3.87–5.11)
RDW: 14 % (ref 11.5–15.5)
WBC: 7.1 10*3/uL (ref 4.0–10.5)
nRBC: 0 % (ref 0.0–0.2)

## 2023-07-04 LAB — BASIC METABOLIC PANEL
Anion gap: 11 (ref 5–15)
BUN: 12 mg/dL (ref 6–20)
CO2: 26 mmol/L (ref 22–32)
Calcium: 9.6 mg/dL (ref 8.9–10.3)
Chloride: 102 mmol/L (ref 98–111)
Creatinine, Ser: 0.69 mg/dL (ref 0.44–1.00)
GFR, Estimated: >60 mL/min (ref >60)
Glucose, Bld: 96 mg/dL (ref 70–99)
Potassium: 4.1 mmol/L (ref 3.5–5.1)
Sodium: 139 mmol/L (ref 135–145)

## 2023-07-04 LAB — BRAIN NATRIURETIC PEPTIDE: B Natriuretic Peptide: 20.1 pg/mL (ref 0.0–100.0)

## 2023-07-04 LAB — TROPONIN I (HIGH SENSITIVITY)
Troponin I (High Sensitivity): 3 ng/L (ref <18)
Troponin I (High Sensitivity): 3 ng/L (ref <18)

## 2023-07-04 NOTE — ED Triage Notes (Signed)
Pt to ED for chest pain since Thursday   States that on Sunday she had episode of chest pain, dizziness and feeling hot. States EMTs told her she was hyperventilating  States she has been having stomach and chest pain but it keeps happening and increasing since her mother was diagnosed with cancer  States has been belching a lot and having GI upset since Thursday after eating at [employer]  Hx anxiety, CHF  States she feels this is all stress related

## 2023-07-04 NOTE — Progress Notes (Signed)
Virtual Visit Consent   [name], you are scheduled for a virtual visit with a [facility] provider today. Just as with appointments in the office, your consent must be obtained to participate. Your consent will be active for this visit and any virtual visit you may have with one of our providers in the next 365 days. If you have a MyChart account, a copy of this consent can be sent to you electronically.  As this is a virtual visit, video technology does not allow for your provider to perform a traditional examination. This may limit your provider's ability to fully assess your condition. If your provider identifies any concerns that need to be evaluated in person or the need to arrange testing (such as labs, EKG, etc.), we will make arrangements to do so. Although advances in technology are sophisticated, we cannot ensure that it will always work on either your end or our end. If the connection with a video visit is poor, the visit may have to be switched to a telephone visit. With either a video or telephone visit, we are not always able to ensure that we have a secure connection.  By engaging in this virtual visit, you consent to the provision of healthcare and authorize for your insurance to be billed (if applicable) for the services provided during this visit. Depending on your insurance coverage, you may receive a charge related to this service.  I need to obtain your verbal consent now. Are you willing to proceed with your visit today? [name] has provided verbal consent on 07/04/2023 for a virtual visit (video or telephone). Melanie Simas, FNP  Date: 07/04/2023 11:42 AM   Virtual Visit via Video Note   I, [name], connected with  [name]  ([id number], December 24, 1985) on 07/04/23 at 11:45 AM EST by a video-enabled telemedicine application and verified that I am speaking with the correct person using two identifiers.  Location: Patient: Virtual Visit Location Patient:  Home Provider: Virtual Visit Location Provider: Home Office   I discussed the limitations of evaluation and management by telemedicine and the availability of in person appointments. The patient expressed understanding and agreed to proceed.    History of Present Illness: [name] is a 38 y.o. who identifies as a female who was assigned female at birth, and is being seen today for follow up from an episode over the past weekend   07/02/23 she was at [employer] and had the sudden onset of stomach upset and hot flashes that onset all of a sudden she felt a wave come over her body   She drove herself home and was feeling like she was going to pass out   She called 911 and the ambulance came to her house  150/70 was her BP at that time she was given oxygen and monitored   She had an episode of confusion and felt she was hyperventilating in the moment   She has since that time had the sensation of reflux/heartburn, that she is having some improvement with using TUMS.   She is under stress, mother was just diagnosed with cancer last month and that is causing her increased amounts of stress   This morning she feels what she describes as anxious and is wondering if she is having panic attacks/ her stomach continues to be upset and her heart rate has been up to 125    She has a pulse ox monitor at home and has been watching  her heart rate and oxygen  At this time she is reading 99%   She was diagnosed with CHF initially when she was pregnant with recurrent symptoms in 2020 She does see cardiology annually- last visit 07/2022   Most recent vital signs from PCP office visit on 06/15/23 162/97 This visit was focused on weight management   She does take her torsemide daily  Not currently taking propranolol or coreg for the past year   She has not had labs in the past year     Problems:  Patient Active Problem List   Diagnosis Date Noted   Allergic rhinitis due to allergen  06/15/2023   Class 3 severe obesity due to excess calories with serious comorbidity and body mass index (BMI) of 60.0 to 69.9 in adult (HCC) 03/21/2023   Angular cheilosis 10/06/2022   History of prediabetes 07/15/2022   Primary hypertension 06/10/2022   Encounter for surveillance of injectable contraceptive 03/11/2021   Chronic diastolic CHF (congestive heart failure) (HCC) 08/08/2018   Moderate persistent asthma with acute exacerbation 12/28/2017   Acid reflux 02/05/2015   Morbid obesity (HCC) 02/05/2015   Obstructive sleep apnea 02/05/2015    Allergies:  Allergies  Allergen Reactions   Lactose Intolerance (Gi) Diarrhea   Penicillins Hives    Has patient had a PCN reaction causing immediate rash, facial/tongue/throat swelling, SOB or lightheadedness with hypotension: Yes Has patient had a PCN reaction causing severe rash involving mucus membranes or skin necrosis: No Has patient had a PCN reaction that required hospitalization No Has patient had a PCN reaction occurring within the last 10 years: Yes If all of the above answers are "NO", then may proceed with Cephalosporin use.   Pineapple Other (See Comments)    Patient states this cuts my mouth and makes it bleed.   Medications:  Current Outpatient Medications:    albuterol (VENTOLIN HFA) 108 (90 Base) MCG/ACT inhaler, Inhale 2 puffs into the lungs every 6 (six) hours as needed for wheezing or shortness of breath., Disp: 8 g, Rfl: 0   carvedilol (COREG) 3.125 MG tablet, Take 1 tablet (3.125 mg total) by mouth 2 (two) times daily with a meal., Disp: 180 tablet, Rfl: 3   cetirizine (ZYRTEC) 10 MG tablet, Take 1 tablet (10 mg total) by mouth daily., Disp: 30 tablet, Rfl: 11   FLUoxetine (PROZAC) 20 MG tablet, Take 1 tablet (20 mg total) by mouth daily., Disp: 30 tablet, Rfl: 1   Fluticasone-Umeclidin-Vilant (TRELEGY ELLIPTA) 200-62.5-25 MCG/ACT AEPB, Inhale 1 puff into the lungs daily., Disp: 28 each, Rfl: 12   metroNIDAZOLE  (METROGEL) 0.75 % vaginal gel, Place 1 Applicatorful vaginally 2 (two) times daily as needed., Disp: , Rfl:    montelukast (SINGULAIR) 10 MG tablet, Take 1 tablet (10 mg total) by mouth at bedtime as needed., Disp: 30 tablet, Rfl: 0   nystatin cream (MYCOSTATIN), Apply 1 application  topically 2 (two) times daily as needed for dry skin., Disp: , Rfl:    omeprazole (PRILOSEC) 40 MG capsule, Take 1 capsule (40 mg total) daily by mouth., Disp: 30 capsule, Rfl: 3   ondansetron (ZOFRAN ODT) 4 MG disintegrating tablet, Take 1 tablet (4 mg total) by mouth every 8 (eight) hours as needed for nausea or vomiting., Disp: 20 tablet, Rfl: 0   Pancrelipase, Lip-Prot-Amyl, (ZENPEP) 40000-126000 units CPEP, Take 1 capsule by mouth 3 (three) times daily as needed., Disp: 84 capsule, Rfl: 0   potassium chloride (KLOR-CON) 10 MEQ tablet, Take 1 tablet (10 mEq  total) by mouth daily., Disp: 90 tablet, Rfl: 3   propranolol (INDERAL) 20 MG tablet, Take 1 tablet (20 mg total) by mouth 3 (three) times daily as needed. For palpitations, Disp: 270 tablet, Rfl: 3   simethicone (MYLICON) 80 MG chewable tablet, Chew 1 tablet (80 mg total) by mouth every 6 (six) hours as needed for flatulence., Disp: 30 tablet, Rfl: 0   tirzepatide (ZEPBOUND) 2.5 MG/0.5ML Pen, Inject 2.5 mg into the skin once a week., Disp: 2 mL, Rfl: 2   tirzepatide (ZEPBOUND) 5 MG/0.5ML Pen, Inject 5 mg into the skin once a week., Disp: 2 mL, Rfl: 0   torsemide (DEMADEX) 20 MG tablet, Take 2 tablets (40 mg total) by mouth daily., Disp: 180 tablet, Rfl: 3   triamcinolone (KENALOG) 0.025 % ointment, Apply 1 Application topically 2 (two) times daily., Disp: 30 g, Rfl: 0   triamcinolone (NASACORT) 55 MCG/ACT AERO nasal inhaler, Place 2 sprays into the nose daily., Disp: 1 each, Rfl: 12   Vitamin D, Ergocalciferol, (DRISDOL) 1.25 MG (50000 UNIT) CAPS capsule, Take 1 capsule (50,000 Units total) by mouth every 7 (seven) days. (Patient not taking: Reported on 06/15/2023),  Disp: 13 capsule, Rfl: 3  Current Facility-Administered Medications:    bupivacaine (MARCAINE) 0.5 % (with pres) injection 3 mL, 3 mL, Other, Once, Tyrell Antonio, MD  Observations/Objective: Patient is well-developed, well-nourished in no acute distress.  Resting comfortably  at home.  Head is normocephalic, atraumatic.  No labored breathing.  Speech is clear and coherent with logical content.  Patient is alert and oriented at baseline.    Assessment and Plan:  1. Chronic diastolic CHF (congestive heart failure) (HCC) (Primary)  2. Primary hypertension  Due to underlying condition with new onset symptoms advised she has follow up with cardiology in the next 24 hours. Understanding that if symptoms worsen/ acute chest pain SOB etc she will seek Emergency room care.   Patient is agreeable to plan and will call Cardiology at end of visit   Concern for cardiac event secondary to acute stress, history of HTN not well controlled with underlying CHF    Follow Up Instructions: I discussed the assessment and treatment plan with the patient. The patient was provided an opportunity to ask questions and all were answered. The patient agreed with the plan and demonstrated an understanding of the instructions.  A copy of instructions were sent to the patient via MyChart unless otherwise noted below.    The patient was advised to call back or seek an in-person evaluation if the symptoms worsen or if the condition fails to improve as anticipated.    Melanie Simas, FNP

## 2023-07-04 NOTE — ED Notes (Signed)
Pt verbalizes understanding of discharge instructions. Opportunity for questioning and answers were provided. Pt discharged from ED to home with significant other.

## 2023-07-04 NOTE — ED Provider Notes (Signed)
[facility] Provider Note    Event Date/Time   First MD Initiated Contact with Patient 07/04/23 1645     (approximate)   History   Chest Pain   HPI  [name] is a 38 y.o. female with a history of obesity, prediabetes, hypertension, diastolic CHF, OSA, and GERD who presents with pressure-like central chest discomfort for the last 2 days.  The patient states that initially 2 days ago she was at the dollar store when she suddenly started to have a panic attack, causing hyperventilation, shortness of breath, lightheadedness, and feels like her vision was closing in on her.  This resolved after EMS came to her and gave her oxygen.  She ultimately did not need transportation to the hospital.  All of the other symptoms have resolved but she is still having the chest pressure intermittently overly as last couple days.  She states sometimes it will be worse after eating, and often it will improve if she belches.  She denies any new or worsening leg swelling.  She currently does not feel short of breath.  She has no cough or fever.  I reviewed the past medical records.  Prior to today, the most recent outpatient encounter was with family medicine on 1/23 for sinus drainage.  She has no recent ED visits or hospitalizations.   Physical Exam   Triage Vital Signs: ED Triage Vitals  Encounter Vitals Group     BP 07/04/23 1459 (!) 168/90     Systolic BP Percentile --      Diastolic BP Percentile --      Pulse Rate 07/04/23 1459 92     Resp 07/04/23 1459 20     Temp 07/04/23 1459 98.3 F (36.8 C)     Temp Source 07/04/23 1459 Oral     SpO2 07/04/23 1459 100 %     Weight 07/04/23 1458 (!) 348 lb (157.9 kg)     Height 07/04/23 1458 5\' 1"  (1.549 m)     Head Circumference --      Peak Flow --      Pain Score --      Pain Loc --      Pain Education --      Exclude from Growth Chart --     Most recent vital signs: Vitals:   07/04/23 1800 07/04/23 1830  BP:  118/70 108/67  Pulse: 86 89  Resp: 20 (!) 24  Temp:    SpO2: 100% 100%     General: Alert, well-appearing, no distress.  CV:  Good peripheral perfusion.  Resp:  Normal effort.  Lungs CTAB. Abd:  No distention.  Other:  Trace bilateral lower extremity edema.   ED Results / Procedures / Treatments   Labs (all labs ordered are listed, but only abnormal results are displayed) Labs Reviewed  BASIC METABOLIC PANEL  CBC  BRAIN NATRIURETIC PEPTIDE  POC URINE PREG, ED  TROPONIN I (HIGH SENSITIVITY)  TROPONIN I (HIGH SENSITIVITY)     EKG  ED ECG REPORT I, Dionne Bucy, the attending physician, personally viewed and interpreted this ECG.  Date: 07/04/2023 EKG Time: 1456 Rate: 100 Rhythm: normal sinus rhythm QRS Axis: normal Intervals: normal ST/T Wave abnormalities: normal Narrative Interpretation: no evidence of acute ischemia    RADIOLOGY  Chest x-ray: I independently viewed and interpreted the images; there is no focal consolidation or edema   PROCEDURES:  Critical Care performed: No  Procedures   MEDICATIONS ORDERED IN ED: Medications -  No data to display   IMPRESSION / MDM / ASSESSMENT AND PLAN / ED COURSE  I reviewed the triage vital signs and the nursing notes.  38 year old female with PMH as noted above including CHF presents with atypical chest discomfort over the last couple of days starting after she had what sounds like a panic attack.  She has no significant associated symptoms.  On exam her blood pressure is slightly elevated.  Other vital signs are normal.  Physical exam is otherwise unremarkable for acute findings.    Differential diagnosis includes, but is not limited to, acute anxiety/panic, GERD, gastritis, musculoskeletal chest wall pain, CHF exacerbation, less likely ACS.  There is no clinical evidence for PE.  There is no evidence for aortic dissection or other vascular etiology.    Initial workup is reassuring; EKG is nonischemic,  chest x-ray shows no acute findings, BMP, CBC, and troponin are all within normal limits.  We will obtain a repeat troponin, BNP, and reassess.  Patient's presentation is most consistent with acute complicated illness / injury requiring diagnostic workup.  ----------------------------------------- 7:03 PM on 07/04/2023 -----------------------------------------  Repeat troponin and BNP are negative.  The patient is stable for discharge home at this time.  I counseled her on the results of the workup.  I gave strict return precautions and she expressed understanding.  I recommended that she take Pepcid twice daily for her GI symptoms for the next 1 to 2 weeks.   FINAL CLINICAL IMPRESSION(S) / ED DIAGNOSES   Final diagnoses:  Atypical chest pain  Anxiety  Gastroesophageal reflux disease without esophagitis     Rx / DC Orders   ED Discharge Orders     None        Note:  This document was prepared using Dragon voice recognition software and may include unintentional dictation errors.    Dionne Bucy, MD 07/04/23 1904

## 2023-07-04 NOTE — Discharge Instructions (Signed)
Suspect that your chest pain is likely due to combination of anxiety/stress as well as stomach acid.  You should take Pepcid (famotidine) 20 mg twice daily for the next 1 to 2 weeks to decrease stomach acid.  This is available over-the-counter.  Follow-up with your primary care provider.  Return to the ER for new, worsening, or persistent severe chest pain, difficulty breathing, weakness, swelling, or any other new or worsening symptoms that concern you.

## 2023-07-04 NOTE — Telephone Encounter (Signed)
Spoke w/ pt.  She states that she was in the [employer] on Sunday when she had an episode of discomfort, felt like acid reflux that seemed to get worse.   She drove herself home and called 911. EMTs found her O2 low and advised her to seek treatment w/in 24 hrs. Her mother has cancer and is sched for surgery next week, she admits to being under significant stress. She reports continued sx and would like an appt today in our office, as she called her PCP and they recommended she call us. Advised her to have her husband take her to ED for full workup. She is agreeable and will proceed there now.

## 2023-07-04 NOTE — ED Provider Triage Note (Signed)
Emergency Medicine Provider Triage Evaluation Note  [name] , a 38 y.o. female  was evaluated in triage.  Pt complains of midsternal chest pain with epigastric pain/belching intermittent since Thursday. She also reports increase stress and anxiety.  Physical Exam  There were no vitals taken for this visit. Gen:   Awake, no distress   Resp:  Normal effort  MSK:   Moves extremities without difficulty  Other:    Medical Decision Making  Medically screening exam initiated at 2:58 PM.  Appropriate orders placed.  ERVIN ROTHBAUER was informed that the remainder of the evaluation will be completed by another provider, this initial triage assessment does not replace that evaluation, and the importance of remaining in the ED until their evaluation is complete.  Cardiac workup started.   Chinita Pester, FNP 07/04/23 1500

## 2023-07-13 ENCOUNTER — Other Ambulatory Visit: Payer: Self-pay | Admitting: Medical

## 2023-07-13 DIAGNOSIS — I5032 Chronic diastolic (congestive) heart failure: Secondary | ICD-10-CM

## 2023-07-14 NOTE — Telephone Encounter (Signed)
Last office visit: 07/26/22 with plan to f/u 12 months.  next office visit: none/active recall  Please schedule f/u appt.  Thanks!

## 2023-07-17 NOTE — Telephone Encounter (Signed)
Pt scheduled on 4/28

## 2023-07-19 ENCOUNTER — Encounter: Payer: Self-pay | Admitting: Family Medicine

## 2023-07-19 ENCOUNTER — Ambulatory Visit: Payer: Managed Care, Other (non HMO) | Admitting: Family Medicine

## 2023-07-19 VITALS — BP 123/81 | HR 89 | Ht 61.0 in | Wt 348.0 lb

## 2023-07-19 DIAGNOSIS — I10 Essential (primary) hypertension: Secondary | ICD-10-CM

## 2023-07-19 DIAGNOSIS — G4733 Obstructive sleep apnea (adult) (pediatric): Secondary | ICD-10-CM

## 2023-07-19 DIAGNOSIS — Z6841 Body Mass Index (BMI) 40.0 and over, adult: Secondary | ICD-10-CM

## 2023-07-19 DIAGNOSIS — F410 Panic disorder [episodic paroxysmal anxiety] without agoraphobia: Secondary | ICD-10-CM | POA: Insufficient documentation

## 2023-07-19 DIAGNOSIS — R110 Nausea: Secondary | ICD-10-CM | POA: Diagnosis not present

## 2023-07-19 DIAGNOSIS — F430 Acute stress reaction: Secondary | ICD-10-CM

## 2023-07-19 MED ORDER — ESCITALOPRAM OXALATE 10 MG PO TABS: 10.0000 mg | ORAL_TABLET | Freq: Every day | ORAL | 3 refills | Status: AC

## 2023-07-19 MED ORDER — ALPRAZOLAM 0.25 MG PO TABS: 0.2500 mg | ORAL_TABLET | Freq: Two times a day (BID) | ORAL | 0 refills | Status: AC | PRN

## 2023-07-19 MED ORDER — ONDANSETRON 4 MG PO TBDP
4.0000 mg | ORAL_TABLET | Freq: Three times a day (TID) | ORAL | 0 refills | Status: DC | PRN
Start: 2023-07-19 — End: 2023-09-14

## 2023-07-19 NOTE — Assessment & Plan Note (Signed)
 Chronic  BMI 65.75 Patient will restart zepbound 2.5mg  and titrate up to 5mg  weekly for weight loss once her anxiety is under better control

## 2023-07-19 NOTE — Assessment & Plan Note (Signed)
 Experienced a panic attack with symptoms of feeling hot, nausea, vision disturbances, and confusion while shopping. Symptoms intensified en route home, leading to an emergency room visit where cardiac causes were ruled out. Anxiety and stress related to family circumstances, including her mother's upcoming surgery, may have contributed. Reports ongoing anxiety and stress, particularly in situations that trigger panic attacks. Xanax is considered for acute anxiety management, with caution regarding driving immediately after administration. Lexapro is planned for daily anxiety management, with an understanding that it requires time to achieve efficacy. - Prescribe Xanax 0.25 mg twice a day as needed for acute anxiety, with caution regarding driving after administration. - Start Lexapro 10 mg daily for long-term anxiety management, with reassessment in 3-4 weeks for potential dosage adjustment. - Refill Zofran 4 mg for nausea management.

## 2023-07-19 NOTE — Progress Notes (Signed)
 Established patient visit   Patient: [name]   DOB: February 21, 1986   38 y.o. Female  MRN: [id number] Visit Date: 07/19/2023  Today's healthcare provider: Ronnald Ramp, MD   Chief Complaint  Patient presents with   Hospitalization Follow-up    Panic attack, she was  evaluated at the ED,  had a follow up virtual appt,  All test came back neg, she has a Cardiology scheduled for 4/25.    Subjective     HPI     Hospitalization Follow-up    Additional comments: Panic attack, she was  evaluated at the ED,  had a follow up virtual appt,  All test came back neg, she has a Cardiology scheduled for 4/25.       Last edited by Thedora Hinders, CMA on 07/19/2023 10:03 AM.       Discussed the use of AI scribe software for clinical note transcription with the patient, who gave verbal consent to proceed.  History of Present Illness   The patient presents with anxiety and panic attack symptoms.  She experienced a severe panic attack on July 04, 2023, while at a [employer] store with her daughter. Symptoms began with a sensation of heat in her feet, progressing to a nervous stomach, and culminated in visual disturbances described as 'black closing in.' She felt claustrophobic, dropped her items, and felt the need to cry. Upon reaching home, she experienced leg weakness and was on the verge of passing out, prompting her family to call an ambulance. At the emergency department, she was given oxygen and evaluated for cardiac issues, which were ruled out. She continues to experience anxiety and panic attack symptoms, particularly in response to certain triggers.  She has a history of anxiety and has been prescribed Prozac 20 mg, which she has not been taking regularly. She is currently using Pepcid for acid reflux and Gas-X for pressure relief, which have provided some relief. She also uses Zofran for nausea, which she finds helpful, although she is running low on it.  She  mentions a history of heart issues, which were considered during her emergency evaluation. She is scheduled to see a cardiologist in April for further evaluation.  She describes ongoing gastrointestinal symptoms, including pressure in her chest and stomach, and episodes of diarrhea. She has been using Pepcid and Gas-X to manage these symptoms, which have provided some relief.  She is currently looking for a therapist to address emotional and neurological concerns, which she attributes to recent family stressors, particularly related to her mother's surgery.         Past Medical History:  Diagnosis Date   Anxiety    CHF (congestive heart failure) (HCC) 2010   Depression    GERD (gastroesophageal reflux disease)    Gestational diabetes mellitus    Hypertension    Obesity    BMI is 65   Sleep apnea     Medications: Outpatient Medications Prior to Visit  Medication Sig   albuterol (VENTOLIN HFA) 108 (90 Base) MCG/ACT inhaler Inhale 2 puffs into the lungs every 6 (six) hours as needed for wheezing or shortness of breath.   carvedilol (COREG) 3.125 MG tablet Take 1 tablet (3.125 mg total) by mouth 2 (two) times daily with a meal.   cetirizine (ZYRTEC) 10 MG tablet Take 1 tablet (10 mg total) by mouth daily.   Fluticasone-Umeclidin-Vilant (TRELEGY ELLIPTA) 200-62.5-25 MCG/ACT AEPB Inhale 1 puff into the lungs daily.   metroNIDAZOLE (METROGEL)  0.75 % vaginal gel Place 1 Applicatorful vaginally 2 (two) times daily as needed.   montelukast (SINGULAIR) 10 MG tablet Take 1 tablet (10 mg total) by mouth at bedtime as needed.   nystatin cream (MYCOSTATIN) Apply 1 application  topically 2 (two) times daily as needed for dry skin.   omeprazole (PRILOSEC) 40 MG capsule Take 1 capsule (40 mg total) daily by mouth.   Pancrelipase, Lip-Prot-Amyl, (ZENPEP) 40000-126000 units CPEP Take 1 capsule by mouth 3 (three) times daily as needed.   potassium chloride (KLOR-CON) 10 MEQ tablet Take 1 tablet (10 mEq  total) by mouth daily.   propranolol (INDERAL) 20 MG tablet Take 1 tablet (20 mg total) by mouth 3 (three) times daily as needed. For palpitations   simethicone (MYLICON) 80 MG chewable tablet Chew 1 tablet (80 mg total) by mouth every 6 (six) hours as needed for flatulence.   tirzepatide (ZEPBOUND) 2.5 MG/0.5ML Pen Inject 2.5 mg into the skin once a week.   tirzepatide (ZEPBOUND) 5 MG/0.5ML Pen Inject 5 mg into the skin once a week.   torsemide (DEMADEX) 20 MG tablet Take 2 tablets (40 mg total) by mouth daily. Due for yearly visit.  PLEASE CALL OFFICE TO SCHEDULE APPOINTMENT PRIOR TO NEXT REFILL   triamcinolone (KENALOG) 0.025 % ointment Apply 1 Application topically 2 (two) times daily.   triamcinolone (NASACORT) 55 MCG/ACT AERO nasal inhaler Place 2 sprays into the nose daily.   [DISCONTINUED] FLUoxetine (PROZAC) 20 MG tablet Take 1 tablet (20 mg total) by mouth daily.   [DISCONTINUED] ondansetron (ZOFRAN ODT) 4 MG disintegrating tablet Take 1 tablet (4 mg total) by mouth every 8 (eight) hours as needed for nausea or vomiting.   Vitamin D, Ergocalciferol, (DRISDOL) 1.25 MG (50000 UNIT) CAPS capsule Take 1 capsule (50,000 Units total) by mouth every 7 (seven) days. (Patient not taking: Reported on 07/19/2023)   Facility-Administered Medications Prior to Visit  Medication Dose Route Frequency Provider   bupivacaine (MARCAINE) 0.5 % (with pres) injection 3 mL  3 mL Other Once Tyrell Antonio, MD    Review of Systems  Last CBC Lab Results  Component Value Date   WBC 7.1 07/04/2023   HGB 12.4 07/04/2023   HCT 37.8 07/04/2023   MCV 90.2 07/04/2023   MCH 29.6 07/04/2023   RDW 14.0 07/04/2023   PLT 346 07/04/2023   Last metabolic panel Lab Results  Component Value Date   GLUCOSE 96 07/04/2023   NA 139 07/04/2023   K 4.1 07/04/2023   CL 102 07/04/2023   CO2 26 07/04/2023   BUN 12 07/04/2023   CREATININE 0.69 07/04/2023   GFRNONAA >60 07/04/2023   CALCIUM 9.6 07/04/2023   PROT 7.1  07/15/2022   ALBUMIN 4.1 07/15/2022   LABGLOB 3.0 07/15/2022   AGRATIO 1.4 07/15/2022   BILITOT 0.2 07/15/2022   ALKPHOS 91 07/15/2022   AST 17 07/15/2022   ALT 13 07/15/2022   ANIONGAP 11 07/04/2023   Last lipids Lab Results  Component Value Date   CHOL 161 07/15/2022   HDL 44 07/15/2022   LDLCALC 104 (H) 07/15/2022   TRIG 64 07/15/2022   CHOLHDL 3.7 07/15/2022   Last hemoglobin A1c Lab Results  Component Value Date   HGBA1C 5.4 07/15/2022   Last thyroid functions Lab Results  Component Value Date   TSH 1.760 07/15/2022        Objective    BP 123/81   Pulse 89   Ht 5\' 1"  (1.549 m)   Wt Marland Kitchen)  348 lb (157.9 kg)   SpO2 100%   BMI 65.75 kg/m  BP Readings from Last 3 Encounters:  07/19/23 123/81  07/04/23 116/80  06/15/23 (!) 162/97   Wt Readings from Last 3 Encounters:  07/19/23 (!) 348 lb (157.9 kg)  07/04/23 (!) 348 lb (157.9 kg)  06/15/23 (!) 345 lb (156.5 kg)        Physical Exam  General: Alert, no acute distress Cardio: Normal S1 and S2, RRR, no r/m/g Pulm: CTAB, normal work of breathing PSYCH: normal mood and congruent affect, normal eye contact, cooperative with exam, no signs of agitation   No results found for any visits on 07/19/23.  Assessment & Plan     Problem List Items Addressed This Visit       Cardiovascular and Mediastinum   Primary hypertension   Chronic  Well controlled  Continue coreg 3.125mg  BID, torsemide 40mg  daily  F/u with cardiology as scheduled          Respiratory   Obstructive sleep apnea     Other   Panic attack due to exceptional stress - Primary   Experienced a panic attack with symptoms of feeling hot, nausea, vision disturbances, and confusion while shopping. Symptoms intensified en route home, leading to an emergency room visit where cardiac causes were ruled out. Anxiety and stress related to family circumstances, including her mother's upcoming surgery, may have contributed. Reports ongoing anxiety and  stress, particularly in situations that trigger panic attacks. Xanax is considered for acute anxiety management, with caution regarding driving immediately after administration. Lexapro is planned for daily anxiety management, with an understanding that it requires time to achieve efficacy. - Prescribe Xanax 0.25 mg twice a day as needed for acute anxiety, with caution regarding driving after administration. - Start Lexapro 10 mg daily for long-term anxiety management, with reassessment in 3-4 weeks for potential dosage adjustment. - Refill Zofran 4 mg for nausea management.      Relevant Medications   ALPRAZolam (XANAX) 0.25 MG tablet   escitalopram (LEXAPRO) 10 MG tablet   Class 3 severe obesity due to excess calories with serious comorbidity and body mass index (BMI) of 60.0 to 69.9 in adult [facility])   Chronic  BMI 65.75 Patient will restart zepbound 2.5mg  and titrate up to 5mg  weekly for weight loss once her anxiety is under better control       Other Visit Diagnoses       Nausea       Relevant Medications   ondansetron (ZOFRAN ODT) 4 MG disintegrating tablet        Gastrointestinal Symptoms Reports ongoing gastrointestinal symptoms, including nausea and pressure in the stomach area, persisting since the panic attack. Symptoms may be related to anxiety or acid reflux, as suggested by previous urgent care visits. Pepcid has provided some relief, and Gas-X has helped with pressure. Zofran has been effective for nausea. - Continue Pepcid as needed for acid reflux symptoms. - Use Gas-X as needed for pressure relief in the stomach area. - Refill Zofran 4 mg for nausea management.         Return in about 3 weeks (around 08/09/2023) for Anxiety.         Ronnald Ramp, MD  [facility] [phone] (phone) [phone] (fax)  [facility]

## 2023-07-19 NOTE — Assessment & Plan Note (Signed)
 Chronic  Well controlled  Continue coreg 3.125mg  BID, torsemide 40mg  daily  F/u with cardiology as scheduled

## 2023-08-11 ENCOUNTER — Telehealth: Payer: Managed Care, Other (non HMO) | Admitting: Family Medicine

## 2023-08-13 ENCOUNTER — Encounter: Payer: Self-pay | Admitting: Family Medicine

## 2023-08-24 ENCOUNTER — Telehealth: Payer: Self-pay

## 2023-08-24 ENCOUNTER — Other Ambulatory Visit (HOSPITAL_COMMUNITY): Payer: Self-pay

## 2023-08-24 NOTE — Telephone Encounter (Signed)
 Pharmacy Patient Advocate Encounter   Received notification from Onbase that prior authorization for Zepbound 2.5MG /0.5ML pen-injectors is required/requested.   Insurance verification completed.   The patient is insured through [facility] .   Per test claim: PA required; PA submitted to above mentioned insurance via CoverMyMeds Key/confirmation #/EOC  [id number] Status is pending

## 2023-08-24 NOTE — Telephone Encounter (Signed)
 Pharmacy Patient Advocate Encounter  Received notification from [facility] that Prior Authorization for Zepbound 2.5MG /0.5ML pen-injectors has been DENIED.  See denial reason below. No denial letter attached in CMM. Will attach denial letter to Media tab once received.   PA #/Case ID/Reference #: [id number]

## 2023-09-07 ENCOUNTER — Telehealth: Payer: Self-pay | Admitting: Cardiovascular Disease

## 2023-09-07 NOTE — Telephone Encounter (Signed)
 Pt c/o medication issue:  1. Name of Medication:  torsemide (DEMADEX) 20 MG tablet  2. How are you currently taking this medication (dosage and times per day)?  2 tablets once daily as needed  3. Are you having a reaction (difficulty breathing--STAT)?   4. What is your medication issue?  Patient says whenever she takes Torsemide she breaks out in hives. Normally around thighs, but today it's on arms and upward near breasts. She says she also notices when she takes it she gets an upset stomach/nausea. Please advise.

## 2023-09-07 NOTE — Telephone Encounter (Signed)
 Spoke with patient and she reports hives and rash from taking her torsemide. She takes her medication and then in roughly 30 minutes she breaks out in hives. This happened one day and she was itching so bad that she had to take a shower and use some benadryl cream. Advised that I would forward to provider for his review and recommendations

## 2023-09-08 MED ORDER — FUROSEMIDE 40 MG PO TABS
40.0000 mg | ORAL_TABLET | Freq: Every day | ORAL | 3 refills | Status: AC | PRN
Start: 2023-09-08 — End: 2023-12-07

## 2023-09-08 NOTE — Addendum Note (Signed)
 Addended by: Elvia Hammans on: 09/08/2023 03:25 PM   Modules accepted: Orders

## 2023-09-08 NOTE — Telephone Encounter (Signed)
 Called patient and notified her of the following from Dr. Gollan.  I see torsemide  on her medication list dating back before 2019  In the past has been well-tolerated based on her prior visits  Not sure why it would start presenting a problem now, could have been something else that gave her the rash  thx  TGollan   Patient states that she is currently taking Torsemide  as needed. Patient reports that she eats prior to taking Torsemide . Patient reports that she does not take any other medications at the same time as Torsemide . Patient states that about 30 minutes after taking Torsemide  she become nauseated and develops a rash to her thighs. Patient reports that it only happens on the days she takes Torsemide .

## 2023-09-13 ENCOUNTER — Ambulatory Visit: Payer: Self-pay | Admitting: Family Medicine

## 2023-09-13 ENCOUNTER — Ambulatory Visit: Payer: Self-pay

## 2023-09-13 NOTE — Telephone Encounter (Signed)
 Please see the FYI message below

## 2023-09-13 NOTE — Telephone Encounter (Signed)
  Chief Complaint: low back pain Symptoms: low back pain Frequency: chronic, worse in last 2-3 weeks Pertinent Negatives: Patient denies fever, GU s/s, injury, SOB Disposition: [] ED /[] Urgent Care (no appt availability in office) / [x] Appointment(In office/virtual)/ []  [facility]/ [] Home Care/ [x] Refused Recommended Disposition /[] [facility]/ []  Follow-up with PCP Additional Notes: Pt states that back pain is a chronic issue. Pt states that about 2-3 weeks ago she went on vacation and slept on a different mattress, pain has been increased since that trip. Pt had appt today with PCP but did not want to keep it d/t the pain. Pt rescheduled for tomorrow. RN advised pt that she should be seen today, pt declined. Pt denies GU s/s, denies loss of bladder/bowel control. 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  Copied from CRM [id number]. Topic: Clinical - Red Word Triage >> Sep 13, 2023  8:14 AM Opal Bill wrote: Red Word that prompted transfer to Nurse Triage: Pt is in lots of pain in back, and all her muscles are tense and tight. She can barely move. Looking to reschedule today's appointment. Reason for Disposition  [1] SEVERE back pain (e.g., excruciating, unable to do any normal activities) AND [2] not improved 2 hours after pain medicine  Answer Assessment - Initial Assessment Questions 1. ONSET: "When did the pain begin?"      Chronic back pain, worse in last 2-3 weeks 2. LOCATION: "Where does it hurt?" (upper, mid or lower back)     Mid to lower back 3. SEVERITY: "How bad is the pain?"  (e.g., Scale 1-10; mild, moderate, or severe)   - MILD (1-3): Doesn't interfere with normal activities.    - MODERATE (4-7): Interferes with normal activities or awakens from sleep.    - SEVERE (8-10): Excruciating pain, unable to do any normal activities.      8 4. PATTERN: "Is the pain constant?" (e.g., yes, no; constant, intermittent)      Some days constant, some days the  pain is less severe at times 5. RADIATION: "Does the pain shoot into your legs or somewhere else?"     Denies, states that sometimes her shoulders get heavy  6. CAUSE:  "What do you think is causing the back pain?"      Unsure, states she has been told she has DJD 7. BACK OVERUSE:  "Any recent lifting of heavy objects, strenuous work or exercise?"     New mattress about a month ago 8. MEDICINES: "What have you taken so far for the pain?" (e.g., nothing, acetaminophen , NSAIDS)     Biofreeze, tylenol  doesn't help 9. NEUROLOGIC SYMPTOMS: "Do you have any weakness, numbness, or problems with bowel/bladder control?"     Tingling in fingers. Pt states that she does get sciatic pain in LL 10. OTHER SYMPTOMS: "Do you have any other symptoms?" (e.g., fever, abdomen pain, burning with urination, blood in urine)       denies 11. PREGNANCY: "Is there any chance you are pregnant?" "When was your last menstrual period?"       denies  Protocols used: Back Pain-A-AH

## 2023-09-14 ENCOUNTER — Ambulatory Visit (INDEPENDENT_AMBULATORY_CARE_PROVIDER_SITE_OTHER): Admitting: Family Medicine

## 2023-09-14 ENCOUNTER — Encounter: Payer: Self-pay | Admitting: Family Medicine

## 2023-09-14 VITALS — BP 131/87 | HR 96 | Ht 61.0 in | Wt 356.0 lb

## 2023-09-14 DIAGNOSIS — R110 Nausea: Secondary | ICD-10-CM

## 2023-09-14 DIAGNOSIS — M545 Low back pain, unspecified: Secondary | ICD-10-CM | POA: Insufficient documentation

## 2023-09-14 MED ORDER — IBUPROFEN 600 MG PO TABS: 600.0000 mg | ORAL_TABLET | Freq: Three times a day (TID) | ORAL | 0 refills | Status: AC | PRN

## 2023-09-14 MED ORDER — KETOROLAC TROMETHAMINE 60 MG/2ML IM SOLN
60.0000 mg | Freq: Once | INTRAMUSCULAR | Status: AC
  Administered 2023-09-14: 30 mg via INTRAMUSCULAR

## 2023-09-14 MED ORDER — TRAMADOL HCL 50 MG PO TABS: 50.0000 mg | ORAL_TABLET | Freq: Three times a day (TID) | ORAL | 0 refills | Status: AC | PRN

## 2023-09-14 MED ORDER — KETOROLAC TROMETHAMINE 30 MG/ML IJ SOLN: 30.0000 mg | Freq: Once | INTRAMUSCULAR | Status: DC

## 2023-09-14 MED ORDER — CYCLOBENZAPRINE HCL 10 MG PO TABS: 10.0000 mg | ORAL_TABLET | Freq: Every day | ORAL | 0 refills | Status: AC

## 2023-09-14 MED ORDER — ONDANSETRON 4 MG PO TBDP: 4.0000 mg | ORAL_TABLET | Freq: Three times a day (TID) | ORAL | 2 refills | Status: AC | PRN

## 2023-09-14 NOTE — Addendum Note (Signed)
 Addended by: SIMMONS-ROBINSON, Mahasin Riviere L on: 09/14/2023 04:50 PM   Modules accepted: Orders

## 2023-09-14 NOTE — Progress Notes (Signed)
 Established patient visit   Patient: [name]   DOB: Jul 08, 1985   38 y.o. Female  MRN: [id number] Visit Date: 09/14/2023  Today's healthcare provider: Mimi Alt, MD   Chief Complaint  Patient presents with   Back Pain    This has been going on for months but the last couple weeks things has been worse   Subjective     HPI     Back Pain    Additional comments: This has been going on for months but the last couple weeks things has been worse      Last edited by Mimi Alt, MD on 09/14/2023  1:19 PM.       Discussed the use of AI scribe software for clinical note transcription with the patient, who gave verbal consent to proceed.  History of Present Illness [name] is a 38 year old female with chronic back pain who presents with acutely worsened symptoms.  She describes a significant exacerbation of her chronic back pain following a recent trip to the beach, where she slept on a softer mattress. Prior to this, she had been experiencing less tension after purchasing a firmer mattress two months ago. Since returning from the trip, she has been unable to find relief, describing severe stiffness and difficulty with mobility, such as walking to the bathroom.  The pain is primarily located in the lower back, with occasional radiation down the leg, resembling sciatica. She associates the onset of her back pain with an epidural received during childbirth, noting that the pain originates from the same area where the epidural was administered. Despite multiple diagnostic studies, including x-rays, no significant findings have been identified except for early degenerative disc height loss at L5-S1 and facet arthropathy at L4-5 and L5-S1.  Her current pain management includes the use of Tylenol , a heating pad, and Biofreeze, which provide limited relief. She has previously undergone physical therapy and chiropractic care, and received spinal  injections that provided temporary relief for about a week. She reports the pain as a 7 to 8 out of 10 in severity.  No bowel or bladder incontinence, numbness in areas where her underwear touches, or coccyx pain. However, she does experience tingling sensations in her hands and feet, which she attributes to holding her foot in a certain position.  Her social history includes efforts to lose weight, which are hindered by her back pain. She has recently started therapy sessions, which have been beneficial for her mental health, and she has been managing without mood medications since starting therapy.     Past Medical History:  Diagnosis Date   Anxiety    CHF (congestive heart failure) (HCC) 2010   Depression    GERD (gastroesophageal reflux disease)    Gestational diabetes mellitus    Hypertension    Obesity    BMI is 65   Sleep apnea     Medications: Outpatient Medications Prior to Visit  Medication Sig   albuterol  (VENTOLIN  HFA) 108 (90 Base) MCG/ACT inhaler Inhale 2 puffs into the lungs every 6 (six) hours as needed for wheezing or shortness of breath.   ALPRAZolam  (XANAX ) 0.25 MG tablet Take 1 tablet (0.25 mg total) by mouth 2 (two) times daily as needed for anxiety.   carvedilol  (COREG ) 3.125 MG tablet Take 1 tablet (3.125 mg total) by mouth 2 (two) times daily with a meal.   cetirizine  (ZYRTEC ) 10 MG tablet Take 1 tablet (10 mg total) by mouth daily.  escitalopram  (LEXAPRO ) 10 MG tablet Take 1 tablet (10 mg total) by mouth daily.   Fluticasone -Umeclidin-Vilant (TRELEGY ELLIPTA ) 200-62.5-25 MCG/ACT AEPB Inhale 1 puff into the lungs daily.   furosemide  (LASIX ) 40 MG tablet Take 1 tablet (40 mg total) by mouth daily as needed.   metroNIDAZOLE  (METROGEL ) 0.75 % vaginal gel Place 1 Applicatorful vaginally 2 (two) times daily as needed.   montelukast  (SINGULAIR ) 10 MG tablet Take 1 tablet (10 mg total) by mouth at bedtime as needed.   nystatin  cream (MYCOSTATIN ) Apply 1 application   topically 2 (two) times daily as needed for dry skin.   omeprazole  (PRILOSEC) 40 MG capsule Take 1 capsule (40 mg total) daily by mouth.   Pancrelipase , Lip-Prot-Amyl, (ZENPEP ) 40000-126000 units CPEP Take 1 capsule by mouth 3 (three) times daily as needed.   potassium chloride  (KLOR-CON ) 10 MEQ tablet Take 1 tablet (10 mEq total) by mouth daily.   propranolol  (INDERAL ) 20 MG tablet Take 1 tablet (20 mg total) by mouth 3 (three) times daily as needed. For palpitations   simethicone  (MYLICON) 80 MG chewable tablet Chew 1 tablet (80 mg total) by mouth every 6 (six) hours as needed for flatulence.   tirzepatide  (ZEPBOUND ) 2.5 MG/0.5ML Pen Inject 2.5 mg into the skin once a week.   tirzepatide  (ZEPBOUND ) 5 MG/0.5ML Pen Inject 5 mg into the skin once a week.   triamcinolone  (KENALOG ) 0.025 % ointment Apply 1 Application topically 2 (two) times daily.   triamcinolone  (NASACORT ) 55 MCG/ACT AERO nasal inhaler Place 2 sprays into the nose daily.   [DISCONTINUED] ondansetron  (ZOFRAN  ODT) 4 MG disintegrating tablet Take 1 tablet (4 mg total) by mouth every 8 (eight) hours as needed for nausea or vomiting.   Vitamin D , Ergocalciferol , (DRISDOL ) 1.25 MG (50000 UNIT) CAPS capsule Take 1 capsule (50,000 Units total) by mouth every 7 (seven) days. (Patient not taking: Reported on 06/15/2023)   Facility-Administered Medications Prior to Visit  Medication Dose Route Frequency Provider   bupivacaine  (MARCAINE ) 0.5 % (with pres) injection 3 mL  3 mL Other Once Bridget Campion, MD    Review of Systems      Objective    BP 131/87   Pulse 96   Ht 5\' 1"  (1.549 m)   Wt (!) 356 lb (161.5 kg)   BMI 67.27 kg/m  BP Readings from Last 3 Encounters:  09/14/23 131/87  07/19/23 123/81  07/04/23 116/80   Wt Readings from Last 3 Encounters:  09/14/23 (!) 356 lb (161.5 kg)  07/19/23 (!) 348 lb (157.9 kg)  07/04/23 (!) 348 lb (157.9 kg)        Physical Exam Musculoskeletal:       Arms:      Results RADIOLOGY Lumbar x-ray: Normal anterior-posterior alignment, early degenerative disc height loss at L5-S1, facet arthropathy at L4-5 and L5-S1, no fractures or dislocations (2021)    No results found for any visits on 09/14/23.  Assessment & Plan     Problem List Items Addressed This Visit       Other   Morbid obesity (HCC)   Acute bilateral low back pain without sciatica - Primary   Relevant Medications   cyclobenzaprine  (FLEXERIL ) 10 MG tablet   ketorolac  (TORADOL ) 30 MG/ML injection 30 mg   ibuprofen  (ADVIL ) 600 MG tablet   traMADol  (ULTRAM ) 50 MG tablet   Other Relevant Orders   Ambulatory referral to Neurosurgery   Other Visit Diagnoses       Nausea  Relevant Medications   ondansetron  (ZOFRAN  ODT) 4 MG disintegrating tablet        Assessment & Plan Chronic low back pain with facet arthropathy Chronic low back pain exacerbated by recent use of a softer mattress, rated 7-8/10, with stiffness and difficulty walking. Occasional radiating pain suggests radiculopathy. Previous lumbar x-ray showed early degenerative disc height loss at L5-S1 and facet arthropathy at L4-5 and L5-S1. Previous spinal injections provided temporary relief. No bowel or bladder dysfunction. Discussed Toradol  injection for musculoskeletal pain, limited to one dose per day for five days in a hospital setting. Discussed tramadol  as a weak narcotic for severe pain, to be used as a last resort. - Administer 30 mg Toradol  injection for pain relief. - Prescribe Flexeril  10 mg at bedtime for muscle relaxation and pain relief. - Prescribe ibuprofen  600 mg every 8 hours as needed for pain. - Prescribe tramadol  for severe pain, to be used as a last resort. - Refer to [facility] for further evaluation and management options.  Degenerative disc disease, lumbar region Degenerative disc disease at L5-S1 with early disc height loss contributing to chronic low back pain and radiculopathy.  Current management includes pain control and referral to a spine specialist.      Return in about 3 months (around 12/14/2023) for Weight MGMT.         Mimi Alt, MD  [facility] [phone] (phone) [phone] (fax)  [facility]

## 2023-09-17 NOTE — Progress Notes (Deleted)
 Evaluation Performed:  Follow-up visit  Date:  09/17/2023   ID:  [name], DOB 1986/01/26, MRN [id number]  Patient Location:  [address] [place] [address]   Provider location:   [facility], [place] office  PCP:  Mimi Alt, MD  Cardiologist:  [facility]  No chief complaint on file.   History of Present Illness:    [name] is a 38 y.o. female  past medical history of Smoker, Reactive airway disease/asthma Morbid obesity  Anxiety OSA, noncompliant with CPAP Chronic diastolic CHF Chronic leg swelling Who presents for follow-up of her fluid retention/leg swelling  Last seen in clinic 3/24   Getting over "sickness", viral Lots of coughing, has improved  Continues on torsemide  40 QOD, sometimes every 3 days No significant foot ankle swelling Sometimes has swelling in her thighs, denies abdominal distention  Drinks water, 4x 16 oz  Blood pressure well-controlled Wants to start regular exercise program, has changed her diet, low-carb   Weight down 11 pounds from prior clinic visit Works at [employer] reviewed: A1C 5.6 CR 0.85  Breathing stable COVID February 2022  Prior hx of palpitations PAC and PVC before on monitor Not taking propranolol  as symptoms are reasonable  EKG personally reviewed by myself on todays visit Shows normal sinus rhythm rate 97 bpm no significant ST-T wave changes  Other past medical history reviewed Previous stress echo September 2017  Dr. Meredeth Stallion January 19, 2016 showing normal stress test breast attenuation artifact  CT scan chest was performed showing calcium score of 0 normal coronary arteries  Echocardiogram January 26, 2016 showing normal LV function normal right heart pressures mild TR mildly dilated left atrium Diastolic parameters normal  Previous Holter done through [facility] showing normal sinus rhythm APCs PVCs and   emergency room [facility]  11/13/2017, shortness of breath Chest x-ray was unremarkable Lab work normal, D-dimer negative  Lab work reviewed Creatinine 0.76 BUN 12 Hemoglobin A1c 4.9 LDL 78   Past Medical History:  Diagnosis Date   Anxiety    CHF (congestive heart failure) (HCC) 2010   Depression    GERD (gastroesophageal reflux disease)    Gestational diabetes mellitus    Hypertension    Obesity    BMI is 65   Sleep apnea    Past Surgical History:  Procedure Laterality Date   CESAREAN SECTION     at 28 weeks for pre-eclampsia, pulmonary edema, gestational DM.   dilationand curettage of Uterus  05/23/2005   SAB     Allergies:   Lactose intolerance (gi), Penicillins, and Pineapple   Social History   Tobacco Use   Smoking status: Former    Types: Cigars   Smokeless tobacco: Never   Tobacco comments:    smokes cigars on occ  Vaping Use   Vaping status: Never Used  Substance Use Topics   Alcohol use: Yes    Alcohol/week: 0.0 standard drinks of alcohol    Comment: ocassional alcohol use; Mixed drinks once every 2-3 months   Drug use: No     Current Outpatient Medications on File Prior to Visit  Medication Sig Dispense Refill   albuterol  (VENTOLIN  HFA) 108 (90 Base) MCG/ACT inhaler Inhale 2 puffs into the lungs every 6 (six) hours as needed for wheezing or shortness of breath. 8 g 0   ALPRAZolam  (XANAX ) 0.25 MG tablet Take 1 tablet (0.25 mg total) by mouth 2 (two) times daily as needed for anxiety.  20 tablet 0   carvedilol  (COREG ) 3.125 MG tablet Take 1 tablet (3.125 mg total) by mouth 2 (two) times daily with a meal. 180 tablet 3   cetirizine  (ZYRTEC ) 10 MG tablet Take 1 tablet (10 mg total) by mouth daily. 30 tablet 11   cyclobenzaprine  (FLEXERIL ) 10 MG tablet Take 1 tablet (10 mg total) by mouth at bedtime. 15 tablet 0   escitalopram  (LEXAPRO ) 10 MG tablet Take 1 tablet (10 mg total) by mouth daily. 30 tablet 3   Fluticasone -Umeclidin-Vilant (TRELEGY ELLIPTA ) 200-62.5-25 MCG/ACT AEPB  Inhale 1 puff into the lungs daily. 28 each 12   furosemide  (LASIX ) 40 MG tablet Take 1 tablet (40 mg total) by mouth daily as needed. 90 tablet 3   ibuprofen  (ADVIL ) 600 MG tablet Take 1 tablet (600 mg total) by mouth every 8 (eight) hours as needed. 30 tablet 0   metroNIDAZOLE  (METROGEL ) 0.75 % vaginal gel Place 1 Applicatorful vaginally 2 (two) times daily as needed.     montelukast  (SINGULAIR ) 10 MG tablet Take 1 tablet (10 mg total) by mouth at bedtime as needed. 30 tablet 0   nystatin  cream (MYCOSTATIN ) Apply 1 application  topically 2 (two) times daily as needed for dry skin.     omeprazole  (PRILOSEC) 40 MG capsule Take 1 capsule (40 mg total) daily by mouth. 30 capsule 3   ondansetron  (ZOFRAN  ODT) 4 MG disintegrating tablet Take 1 tablet (4 mg total) by mouth every 8 (eight) hours as needed for nausea or vomiting. 30 tablet 2   Pancrelipase , Lip-Prot-Amyl, (ZENPEP ) 40000-126000 units CPEP Take 1 capsule by mouth 3 (three) times daily as needed. 84 capsule 0   potassium chloride  (KLOR-CON ) 10 MEQ tablet Take 1 tablet (10 mEq total) by mouth daily. 90 tablet 3   propranolol  (INDERAL ) 20 MG tablet Take 1 tablet (20 mg total) by mouth 3 (three) times daily as needed. For palpitations 270 tablet 3   simethicone  (MYLICON) 80 MG chewable tablet Chew 1 tablet (80 mg total) by mouth every 6 (six) hours as needed for flatulence. 30 tablet 0   tirzepatide  (ZEPBOUND ) 2.5 MG/0.5ML Pen Inject 2.5 mg into the skin once a week. 2 mL 2   tirzepatide  (ZEPBOUND ) 5 MG/0.5ML Pen Inject 5 mg into the skin once a week. 2 mL 0   traMADol  (ULTRAM ) 50 MG tablet Take 1 tablet (50 mg total) by mouth every 8 (eight) hours as needed for up to 7 days. 21 tablet 0   triamcinolone  (KENALOG ) 0.025 % ointment Apply 1 Application topically 2 (two) times daily. 30 g 0   triamcinolone  (NASACORT ) 55 MCG/ACT AERO nasal inhaler Place 2 sprays into the nose daily. 1 each 12   Vitamin D , Ergocalciferol , (DRISDOL ) 1.25 MG (50000 UNIT)  CAPS capsule Take 1 capsule (50,000 Units total) by mouth every 7 (seven) days. (Patient not taking: Reported on 06/15/2023) 13 capsule 3   Current Facility-Administered Medications on File Prior to Visit  Medication Dose Route Frequency Provider Last Rate Last Admin   bupivacaine  (MARCAINE ) 0.5 % (with pres) injection 3 mL  3 mL Other Once Bridget Campion, MD         Family Hx: The patient's family history includes Cancer - Colon in her mother; Hypertension in her maternal grandmother.  ROS:   Please see the history of present illness.    Review of Systems  Constitutional: Negative.   HENT: Negative.    Respiratory: Negative.    Cardiovascular: Negative.   Gastrointestinal: Negative.  Musculoskeletal: Negative.   Neurological: Negative.   Psychiatric/Behavioral: Negative.    All other systems reviewed and are negative.   Labs/Other Tests and Data Reviewed:    Recent Labs: 07/04/2023: B Natriuretic Peptide 20.1; BUN 12; Creatinine, Ser 0.69; Hemoglobin 12.4; Platelets 346; Potassium 4.1; Sodium 139   Recent Lipid Panel Lab Results  Component Value Date/Time   CHOL 161 07/15/2022 10:07 AM   TRIG 64 07/15/2022 10:07 AM   HDL 44 07/15/2022 10:07 AM   CHOLHDL 3.7 07/15/2022 10:07 AM   LDLCALC 104 (H) 07/15/2022 10:07 AM    Wt Readings from Last 3 Encounters:  09/14/23 (!) 356 lb (161.5 kg)  07/19/23 (!) 348 lb (157.9 kg)  07/04/23 (!) 348 lb (157.9 kg)     Exam:    There were no vitals taken for this visit. Constitutional:  oriented to person, place, and time. No distress.  HENT:  Head: Grossly normal Eyes:  no discharge. No scleral icterus.  Neck: No JVD, no carotid bruits  Cardiovascular: Regular rate and rhythm, no murmurs appreciated Pulmonary/Chest: Clear to auscultation bilaterally, no wheezes or rails Abdominal: Soft.  no distension.  no tenderness.  Musculoskeletal: Normal range of motion Neurological:  normal muscle tone. Coordination normal. No  atrophy Skin: Skin warm and dry Psychiatric: normal affect, pleasant   ASSESSMENT & PLAN:    Problem List Items Addressed This Visit   None  Chronic diastolic CHF (congestive heart failure) (HCC) Exacerbated by morbid obesity Relatively euvolemic Recommend she continue torsemide  40 QOD,  Echocardiogram previously offered  Morbid obesity (HCC)  weight trending downward Periodically on phentermine  in the past Recommend she restart her gym membership, exercise program Difficulty getting qualified for other weight loss medications  Palpitations - Previous Holter monitor 2017 showing APCs and PVCs Prior event monitor: Patient triggered events associated with PACs, sometimes with atrial tachycardia  Propranolol  as needed   Leg edema Exacerbated by morbid obesity, sleep apnea, high fluid intake Stable Continue torsemide  40 mg QOD, weight down 11 pounds  OSA Not on CPAP   Total encounter time more than 30 minutes  Greater than 50% was spent in counseling and coordination of care with the patient   Signed, Belva Boyden, MD  [facility] [address], [place], [place] [address]

## 2023-09-18 ENCOUNTER — Ambulatory Visit: Payer: Managed Care, Other (non HMO) | Admitting: Cardiovascular Disease

## 2023-09-18 DIAGNOSIS — I10 Essential (primary) hypertension: Secondary | ICD-10-CM

## 2023-09-18 DIAGNOSIS — E876 Hypokalemia: Secondary | ICD-10-CM

## 2023-09-18 DIAGNOSIS — G4733 Obstructive sleep apnea (adult) (pediatric): Secondary | ICD-10-CM

## 2023-09-18 DIAGNOSIS — Z87898 Personal history of other specified conditions: Secondary | ICD-10-CM

## 2023-09-18 DIAGNOSIS — I5032 Chronic diastolic (congestive) heart failure: Secondary | ICD-10-CM

## 2023-09-18 DIAGNOSIS — R002 Palpitations: Secondary | ICD-10-CM

## 2023-09-18 DIAGNOSIS — R600 Localized edema: Secondary | ICD-10-CM

## 2023-10-03 ENCOUNTER — Encounter: Payer: Self-pay | Admitting: Family Medicine

## 2023-10-03 NOTE — Telephone Encounter (Signed)
 Patient updated via Mychart. Please schedule patient for virtual visit to discuss options for weight management

## 2023-10-03 NOTE — Telephone Encounter (Signed)
 FYI Pt is already scheduled for 7/24 weight follow up

## 2023-10-05 ENCOUNTER — Telehealth: Payer: Self-pay

## 2023-10-05 NOTE — Telephone Encounter (Signed)
 Copied from CRM [id number]. Topic: Referral - Question >> Oct 05, 2023  8:55 AM Zina Hilts wrote: Reason for CRM: Shelagh Derrick b from [facility] [phone] needs to have pt imaging sent over

## 2023-10-05 NOTE — Telephone Encounter (Signed)
 It was explained tot hem that those images were not obtained through our office.  Gave her the information from where she could obtain the records

## 2023-10-05 NOTE — Telephone Encounter (Signed)
 Copied from CRM [id number]. Topic: Clinical - Request for Lab/Test Order >> Oct 05, 2023  9:00 AM Fonda T wrote: Reason for CRM: Shelagh Derrick form [facility] calling for Imaging results on patient that is in office for appointment.   Please contact Maria at ph. [phone], or fax results to [phone]

## 2023-10-06 ENCOUNTER — Telehealth: Payer: Self-pay | Admitting: Family Medicine

## 2023-10-06 NOTE — Telephone Encounter (Unsigned)
 Copied from CRM [id number]. Topic: Medical Record Request - Provider/Facility Request >> Oct 06, 2023 10:02 AM Chasity T wrote: Reason for CRM: Shelagh Derrick from [facility] is requesting physical therapy records for patient to be faxed at [phone]

## 2023-10-11 ENCOUNTER — Other Ambulatory Visit: Payer: Self-pay | Admitting: Family Medicine

## 2023-10-11 DIAGNOSIS — K219 Gastro-esophageal reflux disease without esophagitis: Secondary | ICD-10-CM

## 2023-10-11 MED ORDER — OMEPRAZOLE 40 MG PO CPDR: 40.0000 mg | DELAYED_RELEASE_CAPSULE | Freq: Every day | ORAL | 3 refills | Status: DC

## 2023-10-11 NOTE — Progress Notes (Signed)
 Request from mychart message for reflux med

## 2023-10-11 NOTE — Telephone Encounter (Signed)
 Please see the message below.

## 2023-11-20 ENCOUNTER — Ambulatory Visit
Admission: RE | Admit: 2023-11-20 | Discharge: 2023-11-20 | Disposition: A | Discharge status: Home / Self Care | Source: Ambulatory Visit | Attending: Nurse Practitioner | Admitting: Nurse Practitioner

## 2023-11-20 ENCOUNTER — Other Ambulatory Visit: Payer: Self-pay | Admitting: Nurse Practitioner

## 2023-11-20 ENCOUNTER — Ambulatory Visit
Admission: RE | Admit: 2023-11-20 | Discharge: 2023-11-20 | Disposition: A | Discharge status: Home / Self Care | Attending: Nurse Practitioner | Admitting: Nurse Practitioner

## 2023-11-20 DIAGNOSIS — M47816 Spondylosis without myelopathy or radiculopathy, lumbar region: Secondary | ICD-10-CM | POA: Diagnosis present

## 2023-11-21 NOTE — Progress Notes (Unsigned)
 Evaluation Performed:  Follow-up visit  Date:  11/22/2023   ID:  [name], DOB 05/18/1986, MRN [id number]  Patient Location:  [address] [place] [address]   Provider location:   [facility] [place] office  PCP:  Sharma Coyer, MD  Cardiologist:  Perla CRIS Nicolas  Chief Complaint  Patient presents with   12 month follow up     Patient c/o bilateral LE edema at times, chest tightness/pressure and shortness of breath when climbing stairs.     History of Present Illness:    [name] is a 38 y.o. female  past medical history of Smoker, Reactive airway disease/asthma Morbid obesity  Anxiety OSA, noncompliant with CPAP Chronic diastolic CHF Chronic leg swelling Who presents for follow-up of her fluid retention/leg swelling  Last seen in clinic 3/24 Recent stress , mother with colon cancer 1/25, s/p surgery, on chemo  Almost passed out, panic attack 06/2023 Was hyperventilating, was told oxygen low, blood pressure low She was placed on nasal cannula oxygen, slowed her breathing and recovered without intervention  On lasix  40 daily as needed 2-3 x weeks Torsemide  caused hives  Stopped smoking 3/24  Occasional leg swelling Blood pressure controlled  Labs reviewed: A1C 5.6 CR 0.85  COVID February 2022  Prior hx of palpitations PAC and PVC before on monitor Not taking propranolol  as symptoms are reasonable  EKG personally reviewed by myself on todays visit EKG Interpretation Date/Time:  Wednesday November 22 2023 10:36:07 EDT Ventricular Rate:  94 PR Interval:  140 QRS Duration:  88 QT Interval:  352 QTC Calculation: 440 R Axis:   -5  Text Interpretation: Normal sinus rhythm Normal ECG When compared with ECG of 04-Jul-2023 14:56, Nonspecific T wave abnormality no longer evident in Lateral leads Confirmed by Perla Lye [phone]) on 11/22/2023 10:52:00 AM    Other past medical history  reviewed Previous stress echo September 2017  Dr. Fernand January 19, 2016 showing normal stress test breast attenuation artifact  CT scan chest was performed showing calcium score of 0 normal coronary arteries  Echocardiogram January 26, 2016 showing normal LV function normal right heart pressures mild TR mildly dilated left atrium Diastolic parameters normal  Previous Holter done through [facility] showing normal sinus rhythm APCs PVCs and   emergency room [facility] 11/13/2017, shortness of breath Chest x-ray was unremarkable Lab work normal, D-dimer negative  Lab work reviewed Creatinine 0.76 BUN 12 Hemoglobin A1c 4.9 LDL 78   Past Medical History:  Diagnosis Date   Anxiety    CHF (congestive heart failure) (HCC) 2010   Depression    GERD (gastroesophageal reflux disease)    Gestational diabetes mellitus    Hypertension    Obesity    BMI is 65   Sleep apnea    Past Surgical History:  Procedure Laterality Date   CESAREAN SECTION     at 28 weeks for pre-eclampsia, pulmonary edema, gestational DM.   dilationand curettage of Uterus  05/23/2005   SAB     Allergies:   Lactose intolerance (gi), Penicillins, and Pineapple   Social History   Tobacco Use   Smoking status: Former    Types: Cigars   Smokeless tobacco: Never   Tobacco comments:    smokes cigars on occ  Vaping Use   Vaping status: Never Used  Substance Use Topics   Alcohol use: Yes    Alcohol/week: 0.0 standard drinks of alcohol    Comment: ocassional alcohol use;  Mixed drinks once every 2-3 months   Drug use: No     Current Outpatient Medications on File Prior to Visit  Medication Sig Dispense Refill   albuterol  (VENTOLIN  HFA) 108 (90 Base) MCG/ACT inhaler Inhale 2 puffs into the lungs every 6 (six) hours as needed for wheezing or shortness of breath. 8 g 0   ALPRAZolam  (XANAX ) 0.25 MG tablet Take 1 tablet (0.25 mg total) by mouth 2 (two) times daily as needed for anxiety. 20 tablet 0   carvedilol   (COREG ) 3.125 MG tablet Take 1 tablet (3.125 mg total) by mouth 2 (two) times daily with a meal. 180 tablet 3   cetirizine  (ZYRTEC ) 10 MG tablet Take 1 tablet (10 mg total) by mouth daily. 30 tablet 11   cyclobenzaprine  (FLEXERIL ) 10 MG tablet Take 1 tablet (10 mg total) by mouth at bedtime. 15 tablet 0   escitalopram  (LEXAPRO ) 10 MG tablet Take 1 tablet (10 mg total) by mouth daily. 30 tablet 3   Fluticasone -Umeclidin-Vilant (TRELEGY ELLIPTA ) 200-62.5-25 MCG/ACT AEPB Inhale 1 puff into the lungs daily. 28 each 12   furosemide  (LASIX ) 40 MG tablet Take 1 tablet (40 mg total) by mouth daily as needed. 90 tablet 3   ibuprofen  (ADVIL ) 600 MG tablet Take 1 tablet (600 mg total) by mouth every 8 (eight) hours as needed. 30 tablet 0   metroNIDAZOLE  (METROGEL ) 0.75 % vaginal gel Place 1 Applicatorful vaginally 2 (two) times daily as needed.     montelukast  (SINGULAIR ) 10 MG tablet Take 1 tablet (10 mg total) by mouth at bedtime as needed. 30 tablet 0   nystatin  cream (MYCOSTATIN ) Apply 1 application  topically 2 (two) times daily as needed for dry skin.     omeprazole  (PRILOSEC) 40 MG capsule Take 1 capsule (40 mg total) by mouth daily. 60 capsule 3   ondansetron  (ZOFRAN  ODT) 4 MG disintegrating tablet Take 1 tablet (4 mg total) by mouth every 8 (eight) hours as needed for nausea or vomiting. 30 tablet 2   Pancrelipase , Lip-Prot-Amyl, (ZENPEP ) 40000-126000 units CPEP Take 1 capsule by mouth 3 (three) times daily as needed. 84 capsule 0   potassium chloride  (KLOR-CON ) 10 MEQ tablet Take 1 tablet (10 mEq total) by mouth daily. 90 tablet 3   propranolol  (INDERAL ) 20 MG tablet Take 1 tablet (20 mg total) by mouth 3 (three) times daily as needed. For palpitations 270 tablet 3   simethicone  (MYLICON) 80 MG chewable tablet Chew 1 tablet (80 mg total) by mouth every 6 (six) hours as needed for flatulence. 30 tablet 0   traMADol  (ULTRAM ) 50 MG tablet Take 50 mg by mouth every 6 (six) hours as needed.      triamcinolone  (KENALOG ) 0.025 % ointment Apply 1 Application topically 2 (two) times daily. 30 g 0   triamcinolone  (NASACORT ) 55 MCG/ACT AERO nasal inhaler Place 2 sprays into the nose daily. 1 each 12   tirzepatide  (ZEPBOUND ) 2.5 MG/0.5ML Pen Inject 2.5 mg into the skin once a week. (Patient not taking: Reported on 11/22/2023) 2 mL 2   tirzepatide  (ZEPBOUND ) 5 MG/0.5ML Pen Inject 5 mg into the skin once a week. (Patient not taking: Reported on 11/22/2023) 2 mL 0   Vitamin D , Ergocalciferol , (DRISDOL ) 1.25 MG (50000 UNIT) CAPS capsule Take 1 capsule (50,000 Units total) by mouth every 7 (seven) days. (Patient not taking: Reported on 11/22/2023) 13 capsule 3   Current Facility-Administered Medications on File Prior to Visit  Medication Dose Route Frequency Provider Last Rate Last Admin  bupivacaine  (MARCAINE ) 0.5 % (with pres) injection 3 mL  3 mL Other Once Eldonna Novel, MD         Family Hx: The patient's family history includes Cancer - Colon in her mother; Hypertension in her maternal grandmother.  ROS:   Please see the history of present illness.    Review of Systems  Constitutional: Negative.   HENT: Negative.    Respiratory: Negative.    Cardiovascular: Negative.   Gastrointestinal: Negative.   Musculoskeletal: Negative.   Neurological: Negative.   Psychiatric/Behavioral: Negative.    All other systems reviewed and are negative.   Labs/Other Tests and Data Reviewed:    Recent Labs: 07/04/2023: B Natriuretic Peptide 20.1; BUN 12; Creatinine, Ser 0.69; Hemoglobin 12.4; Platelets 346; Potassium 4.1; Sodium 139   Recent Lipid Panel Lab Results  Component Value Date/Time   CHOL 161 07/15/2022 10:07 AM   TRIG 64 07/15/2022 10:07 AM   HDL 44 07/15/2022 10:07 AM   CHOLHDL 3.7 07/15/2022 10:07 AM   LDLCALC 104 (H) 07/15/2022 10:07 AM    Wt Readings from Last 3 Encounters:  11/22/23 (!) 354 lb 4 oz (160.7 kg)  09/14/23 (!) 356 lb (161.5 kg)  07/19/23 (!) 348 lb (157.9 kg)      Exam:    BP 120/80 (BP Location: Left Wrist, Patient Position: Sitting, Cuff Size: Large)   Pulse 94   Ht 5' 1 (1.549 m)   Wt (!) 354 lb 4 oz (160.7 kg)   SpO2 98%   BMI 66.93 kg/m  Constitutional:  oriented to person, place, and time. No distress.  HENT:  Head: Grossly normal Eyes:  no discharge. No scleral icterus.  Neck: No JVD, no carotid bruits  Cardiovascular: Regular rate and rhythm, no murmurs appreciated Pulmonary/Chest: Clear to auscultation bilaterally, no wheezes or rales Abdominal: Soft.  no distension.  no tenderness.  Musculoskeletal: Normal range of motion Neurological:  normal muscle tone. Coordination normal. No atrophy Skin: Skin warm and dry Psychiatric: normal affect, pleasant    ASSESSMENT & PLAN:    Problem List Items Addressed This Visit       Cardiology Problems   Chronic diastolic CHF (congestive heart failure) (HCC) - Primary   Relevant Orders   EKG 12-Lead (Completed)     Other   Morbid obesity (HCC)   History of prediabetes   Other Visit Diagnoses       OSA (obstructive sleep apnea)         Essential hypertension       Relevant Orders   EKG 12-Lead (Completed)     Leg edema         Palpitations       Relevant Orders   EKG 12-Lead (Completed)      Chronic diastolic CHF (congestive heart failure) (HCC) Exacerbated by morbid obesity Recommend she continue Lasix  40 mg daily as needed for leg swelling and shortness of breath, weight gain Reported having rash of some kind on torsemide  Echocardiogram previously offered  Morbid obesity (HCC) We have encouraged continued exercise, careful diet management in an effort to lose weight. Recommend she consider GLP-1 agonist  Palpitations - Previous Holter monitor 2017 showing APCs and PVCs Recommend she continue propranolol  as needed, new prescription sent in   Leg edema Exacerbated by  obesity, sleep apnea, high fluid intake Stable Off torsemide  secondary to skin rash,  tolerating Lasix  40 as needed  OSA Not on CPAP   Signed, Evalene Lunger, MD  [facility]  [place] [address], [place], [place] [address]

## 2023-11-22 ENCOUNTER — Ambulatory Visit: Attending: Cardiovascular Disease | Admitting: Cardiovascular Disease

## 2023-11-22 ENCOUNTER — Encounter: Payer: Self-pay | Admitting: Cardiovascular Disease

## 2023-11-22 VITALS — BP 120/80 | HR 94 | Ht 61.0 in | Wt 354.2 lb

## 2023-11-22 DIAGNOSIS — G4733 Obstructive sleep apnea (adult) (pediatric): Secondary | ICD-10-CM

## 2023-11-22 DIAGNOSIS — R600 Localized edema: Secondary | ICD-10-CM

## 2023-11-22 DIAGNOSIS — I5032 Chronic diastolic (congestive) heart failure: Secondary | ICD-10-CM

## 2023-11-22 DIAGNOSIS — Z87898 Personal history of other specified conditions: Secondary | ICD-10-CM

## 2023-11-22 DIAGNOSIS — I10 Essential (primary) hypertension: Secondary | ICD-10-CM

## 2023-11-22 DIAGNOSIS — R002 Palpitations: Secondary | ICD-10-CM

## 2023-11-22 DIAGNOSIS — E876 Hypokalemia: Secondary | ICD-10-CM

## 2023-11-22 MED ORDER — POTASSIUM CHLORIDE ER 10 MEQ PO TBCR: 10.0000 meq | EXTENDED_RELEASE_TABLET | Freq: Every day | ORAL | 3 refills | Status: AC

## 2023-11-22 MED ORDER — PROPRANOLOL HCL 20 MG PO TABS: 20.0000 mg | ORAL_TABLET | Freq: Three times a day (TID) | ORAL | 1 refills | Status: AC | PRN

## 2023-11-22 NOTE — Patient Instructions (Addendum)

## 2023-12-14 ENCOUNTER — Encounter: Payer: Self-pay | Admitting: Family Medicine

## 2023-12-14 ENCOUNTER — Telehealth (INDEPENDENT_AMBULATORY_CARE_PROVIDER_SITE_OTHER): Admitting: Family Medicine

## 2023-12-14 ENCOUNTER — Ambulatory Visit: Admitting: Family Medicine

## 2023-12-14 DIAGNOSIS — K219 Gastro-esophageal reflux disease without esophagitis: Secondary | ICD-10-CM

## 2023-12-14 DIAGNOSIS — R110 Nausea: Secondary | ICD-10-CM | POA: Diagnosis not present

## 2023-12-14 DIAGNOSIS — E282 Polycystic ovarian syndrome: Secondary | ICD-10-CM

## 2023-12-14 DIAGNOSIS — Z800 Family history of malignant neoplasm of digestive organs: Secondary | ICD-10-CM

## 2023-12-14 DIAGNOSIS — E66813 Obesity, class 3: Secondary | ICD-10-CM | POA: Diagnosis not present

## 2023-12-14 DIAGNOSIS — Z6841 Body Mass Index (BMI) 40.0 and over, adult: Secondary | ICD-10-CM

## 2023-12-14 MED ORDER — TOPIRAMATE 25 MG PO TABS: 25.0000 mg | ORAL_TABLET | Freq: Two times a day (BID) | ORAL | 1 refills | Status: AC

## 2023-12-14 MED ORDER — PANTOPRAZOLE SODIUM 40 MG PO TBEC: 40.0000 mg | DELAYED_RELEASE_TABLET | Freq: Every day | ORAL | 3 refills | Status: AC

## 2023-12-14 NOTE — Progress Notes (Signed)
 MyChart Video Visit    Virtual Visit via Video Note   This format is felt to be most appropriate for this patient at this time. Physical exam was limited by quality of the video and audio technology used for the visit.   Patient location: Patient's home address   Provider location: [facility]  [address], [address]  [place], [place] [address]   I discussed the limitations of evaluation and management by telemedicine and the availability of in person appointments. The patient expressed understanding and agreed to proceed.  Patient: [name]   DOB: June 04, 1985   38 y.o. Female  MRN: [id number] Visit Date: 12/14/2023  Today's healthcare provider: Rockie Agent, MD   No chief complaint on file.  Subjective    HPI   Discussed the use of AI scribe software for clinical note transcription with the patient, who gave verbal consent to proceed.  History of Present Illness [name] is a 38 year old female who presents for weight management follow-up.  Her Zepbound  prescription was denied by her insurance. She had previously been on Drizepa 5 mg but experienced side effects, leading to a dose reduction. She is concerned about restarting Zepbound  due to stomach issues.  She experiences indigestion and nausea since starting methotrexate. The indigestion has improved, but she still feels chest soreness and occasional nausea. She uses Zofran  for nausea. She is trying to identify food triggers for her indigestion, noting that red juices, fried foods, and citrus seem to exacerbate her symptoms.  She has a family history of colon cancer, with her mother, grandfather, and possibly grandmother affected. Her mother was diagnosed at age 36 with stage 3C colon cancer.  She stopped her Depo shot last year and her menstrual cycle has not returned. She has a history of PCOS and experiences PMS symptoms without menstruation. She is not currently seeing a  gynecologist.  She was recently taken off blood pressure medication but remains on Lasix  for fluid management. She switched from torsemide  to furosemide  due to hives, and finds furosemide  less potent but more tolerable.      Past Medical History:  Diagnosis Date   Anxiety    CHF (congestive heart failure) (HCC) 2010   Depression    GERD (gastroesophageal reflux disease)    Gestational diabetes mellitus    Hypertension    Obesity    BMI is 65   Sleep apnea     Medications: Outpatient Medications Prior to Visit  Medication Sig   albuterol  (VENTOLIN  HFA) 108 (90 Base) MCG/ACT inhaler Inhale 2 puffs into the lungs every 6 (six) hours as needed for wheezing or shortness of breath.   ALPRAZolam  (XANAX ) 0.25 MG tablet Take 1 tablet (0.25 mg total) by mouth 2 (two) times daily as needed for anxiety.   cetirizine  (ZYRTEC ) 10 MG tablet Take 1 tablet (10 mg total) by mouth daily.   cyclobenzaprine  (FLEXERIL ) 10 MG tablet Take 1 tablet (10 mg total) by mouth at bedtime.   escitalopram  (LEXAPRO ) 10 MG tablet Take 1 tablet (10 mg total) by mouth daily.   Fluticasone -Umeclidin-Vilant (TRELEGY ELLIPTA ) 200-62.5-25 MCG/ACT AEPB Inhale 1 puff into the lungs daily.   furosemide  (LASIX ) 40 MG tablet Take 1 tablet (40 mg total) by mouth daily as needed.   ibuprofen  (ADVIL ) 600 MG tablet Take 1 tablet (600 mg total) by mouth every 8 (eight) hours as needed.   metroNIDAZOLE  (METROGEL ) 0.75 % vaginal gel Place 1 Applicatorful vaginally 2 (two) times  daily as needed.   montelukast  (SINGULAIR ) 10 MG tablet Take 1 tablet (10 mg total) by mouth at bedtime as needed.   nystatin  cream (MYCOSTATIN ) Apply 1 application  topically 2 (two) times daily as needed for dry skin.   ondansetron  (ZOFRAN  ODT) 4 MG disintegrating tablet Take 1 tablet (4 mg total) by mouth every 8 (eight) hours as needed for nausea or vomiting.   Pancrelipase , Lip-Prot-Amyl, (ZENPEP ) 40000-126000 units CPEP Take 1 capsule by mouth 3 (three)  times daily as needed.   potassium chloride  (KLOR-CON ) 10 MEQ tablet Take 1 tablet (10 mEq total) by mouth daily.   propranolol  (INDERAL ) 20 MG tablet Take 1 tablet (20 mg total) by mouth 3 (three) times daily as needed (for tachycardia). For palpitations   simethicone  (MYLICON) 80 MG chewable tablet Chew 1 tablet (80 mg total) by mouth every 6 (six) hours as needed for flatulence.   tirzepatide  (ZEPBOUND ) 2.5 MG/0.5ML Pen Inject 2.5 mg into the skin once a week. (Patient not taking: Reported on 11/22/2023)   tirzepatide  (ZEPBOUND ) 5 MG/0.5ML Pen Inject 5 mg into the skin once a week. (Patient not taking: Reported on 11/22/2023)   traMADol  (ULTRAM ) 50 MG tablet Take 50 mg by mouth every 6 (six) hours as needed.   triamcinolone  (KENALOG ) 0.025 % ointment Apply 1 Application topically 2 (two) times daily.   triamcinolone  (NASACORT ) 55 MCG/ACT AERO nasal inhaler Place 2 sprays into the nose daily.   Vitamin D , Ergocalciferol , (DRISDOL ) 1.25 MG (50000 UNIT) CAPS capsule Take 1 capsule (50,000 Units total) by mouth every 7 (seven) days. (Patient not taking: Reported on 11/22/2023)   [DISCONTINUED] omeprazole  (PRILOSEC) 40 MG capsule Take 1 capsule (40 mg total) by mouth daily.   Facility-Administered Medications Prior to Visit  Medication Dose Route Frequency Provider   bupivacaine  (MARCAINE ) 0.5 % (with pres) injection 3 mL  3 mL Other Once Eldonna Novel, MD    Review of Systems  Last metabolic panel Lab Results  Component Value Date   GLUCOSE 96 07/04/2023   NA 139 07/04/2023   K 4.1 07/04/2023   CL 102 07/04/2023   CO2 26 07/04/2023   BUN 12 07/04/2023   CREATININE 0.69 07/04/2023   GFRNONAA >60 07/04/2023   CALCIUM 9.6 07/04/2023   PROT 7.1 07/15/2022   ALBUMIN 4.1 07/15/2022   LABGLOB 3.0 07/15/2022   AGRATIO 1.4 07/15/2022   BILITOT 0.2 07/15/2022   ALKPHOS 91 07/15/2022   AST 17 07/15/2022   ALT 13 07/15/2022   ANIONGAP 11 07/04/2023   Last lipids Lab Results  Component  Value Date   CHOL 161 07/15/2022   HDL 44 07/15/2022   LDLCALC 104 (H) 07/15/2022   TRIG 64 07/15/2022   CHOLHDL 3.7 07/15/2022   Last hemoglobin A1c Lab Results  Component Value Date   HGBA1C 5.4 07/15/2022        Objective    There were no vitals taken for this visit.  BP Readings from Last 3 Encounters:  11/22/23 120/80  09/14/23 131/87  07/19/23 123/81   Wt Readings from Last 3 Encounters:  11/22/23 (!) 354 lb 4 oz (160.7 kg)  09/14/23 (!) 356 lb (161.5 kg)  07/19/23 (!) 348 lb (157.9 kg)        Physical Exam Vitals reviewed.  Constitutional:      General: She is not in acute distress.    Appearance: Normal appearance. She is not ill-appearing.  Pulmonary:     Effort: Pulmonary effort is normal. No respiratory distress.  Neurological:     Mental Status: She is alert and oriented to person, place, and time.  Psychiatric:        Mood and Affect: Mood normal.        Behavior: Behavior normal.        Thought Content: Thought content normal.        Assessment & Plan     Problem List Items Addressed This Visit       Digestive   Acid reflux - Primary   Gastroesophageal Reflux Disease (GERD) Chronic She reports ongoing indigestion and chest soreness, possibly exacerbated by stress related to her mother's cancer. GERD is suspected, and the possibility of an ulcer or H. pylori infection is considered. A referral to gastroenterology is recommended for further evaluation and possible endoscopy. - Refer to gastroenterology for further evaluation and possible endoscopy - Switch from omeprazole  to Protonix  40 mg before meals - Provide dietary guidance to avoid triggers such as acidic and spicy foods      Relevant Medications   pantoprazole  (PROTONIX ) 40 MG tablet   Other Relevant Orders   Ambulatory referral to Gastroenterology     Other   Class 3 severe obesity due to excess calories with serious comorbidity and body mass index (BMI) of 60.0 to 69.9 in  adult   Obesity Chronic  Pt  is hesitant to restart Zepbound  due to stomach issues and insurance denial for a higher dose. Alternative medications were discussed, and Topamax  was chosen for its off-label use in decreasing appetite, though it may not be as effective as Zepbound  or Wegovy . Potential side effects include rash and brain fog. - Prescribe Topamax  25 mg twice a day - Monitor for side effects such as rash or brain fog - Follow up in one month to assess weight management progress      Relevant Medications   topiramate  (TOPAMAX ) 25 MG tablet   Other Visit Diagnoses       Nausea       Relevant Orders   Ambulatory referral to Gastroenterology     Family history of colon cancer in mother         PCOS (polycystic ovarian syndrome)            Assessment & Plan   Polycystic Ovary Syndrome (PCOS) She reports amenorrhea since stopping Depo-Provera  last year, attributed to PCOS. Weight management is emphasized to help regulate menstrual cycles. Birth control pills and metformin  were discussed to regulate cycles and manage insulin resistance. - Focus on weight management to help regulate menstrual cycles    Family History of Colon Cancer She has a significant family history of colon cancer, with her mother, grandfather, and possibly grandmother affected. The importance of early screening is discussed given the family history, although no family members were diagnosed before age 42. A referral to gastroenterology is recommended to discuss the possibility of starting colonoscopies earlier than age 74. - Discuss with gastroenterology the possibility of starting colonoscopies earlier than age 73     Return in about 1 month (around 01/14/2024) for GERD, Weight MGMT.     I discussed the assessment and treatment plan with the patient. The patient was provided an opportunity to ask questions and all were answered. The patient agreed with the plan and demonstrated an understanding of  the instructions.   The patient was advised to call back or seek an in-person evaluation if the symptoms worsen or if the condition fails to improve as anticipated.  I provided 30  minutes of non-face-to-face time during this encounter.   Rockie Agent, MD [facility] [phone] (phone) [phone] (fax)  [facility]

## 2023-12-14 NOTE — Assessment & Plan Note (Signed)
 Obesity Chronic  Pt  is hesitant to restart Zepbound  due to stomach issues and insurance denial for a higher dose. Alternative medications were discussed, and Topamax  was chosen for its off-label use in decreasing appetite, though it may not be as effective as Zepbound  or Wegovy . Potential side effects include rash and brain fog. - Prescribe Topamax  25 mg twice a day - Monitor for side effects such as rash or brain fog - Follow up in one month to assess weight management progress

## 2023-12-14 NOTE — Assessment & Plan Note (Addendum)
 Gastroesophageal Reflux Disease (GERD) Chronic She reports ongoing indigestion and chest soreness, possibly exacerbated by stress related to her mother's cancer. GERD is suspected, and the possibility of an ulcer or H. pylori infection is considered. A referral to gastroenterology is recommended for further evaluation and possible endoscopy. - Refer to gastroenterology for further evaluation and possible endoscopy - Switch from omeprazole  to Protonix  40 mg before meals - Provide dietary guidance to avoid triggers such as acidic and spicy foods

## 2024-01-09 ENCOUNTER — Encounter: Payer: Self-pay | Admitting: Family Medicine

## 2024-01-12 MED ORDER — PROCHLORPERAZINE MALEATE 10 MG PO TABS: 10.0000 mg | ORAL_TABLET | Freq: Four times a day (QID) | ORAL | 0 refills | Status: DC | PRN

## 2024-01-12 MED ORDER — ONDANSETRON 8 MG PO TBDP: 8.0000 mg | ORAL_TABLET | Freq: Three times a day (TID) | ORAL | 0 refills | Status: DC | PRN

## 2024-01-18 ENCOUNTER — Telehealth: Payer: Self-pay | Admitting: Cardiovascular Disease

## 2024-01-18 NOTE — Telephone Encounter (Signed)
 Pt c/o BP issue: STAT if pt c/o blurred vision, one-sided weakness or slurred speech.  STAT if BP is GREATER than 180/120 TODAY.  STAT if BP is LESS than 90/60 and SYMPTOMATIC TODAY  1. What is your BP concern? Pt's BP is elevated- pt has lost 11 pounds since last visit with Dr. Gollan due to stopping carvedilol  and is worried that the elevated BP and weight loss may be related  2. Have you taken any BP medication today?   3. What are your last 5 BP readings?180/100 at biometric screening this morning, 166/91 around 9 am  4. Are you having any other symptoms (ex. Dizziness, headache, blurred vision, passed out)? Bouts of anxiety

## 2024-01-18 NOTE — Telephone Encounter (Signed)
 Returned the call to the patient. She stated that she had a biometric screening this morning at work and her blood pressure was 170/100. After the second reading it was 166/91. She stated that she is asymptomatic. She also stated that she has been really stressed lately  She does not check her blood pressure at home but does have a blood pressure cuff. She has been advised to check her blood pressure daily and keep a log of these. She has also been advised to watch her sodium intake.   She takes Furosemide  as needed abut has not needed it in 3 weeks.

## 2024-01-23 NOTE — Telephone Encounter (Signed)
 Called patient and notified her of the following from Dr. Gollan.  Need a log of more pressures Thx TGollan  Patient verbalizes understanding. Patient states that she will keep a log and send in.

## 2024-06-12 ENCOUNTER — Other Ambulatory Visit: Payer: Self-pay | Admitting: Family Medicine

## 2024-06-16 ENCOUNTER — Encounter: Payer: Self-pay | Admitting: Family Medicine

## 2024-06-16 DIAGNOSIS — R110 Nausea: Secondary | ICD-10-CM

## 2024-06-16 DIAGNOSIS — K219 Gastro-esophageal reflux disease without esophagitis: Secondary | ICD-10-CM

## 2024-06-19 MED ORDER — PROCHLORPERAZINE MALEATE 10 MG PO TABS: 10.0000 mg | ORAL_TABLET | Freq: Four times a day (QID) | ORAL | 0 refills | Status: AC | PRN

## 2024-06-19 MED ORDER — ONDANSETRON 8 MG PO TBDP: 8.0000 mg | ORAL_TABLET | Freq: Three times a day (TID) | ORAL | 2 refills | Status: AC | PRN
